# Patient Record
Sex: Female | Born: 1985 | Race: Black or African American | Hispanic: No | Marital: Single | Smoking: Current every day smoker
Health system: Southern US, Community
[De-identification: ages and names within clinical notes are randomized; demographics above are authoritative.]

## PROBLEM LIST (undated history)

## (undated) ENCOUNTER — Inpatient Hospital Stay (HOSPITAL_COMMUNITY): Payer: Self-pay

## (undated) DIAGNOSIS — Z8619 Personal history of other infectious and parasitic diseases: Secondary | ICD-10-CM

## (undated) DIAGNOSIS — F419 Anxiety disorder, unspecified: Secondary | ICD-10-CM

## (undated) DIAGNOSIS — F53 Postpartum depression: Secondary | ICD-10-CM

## (undated) DIAGNOSIS — N898 Other specified noninflammatory disorders of vagina: Secondary | ICD-10-CM

## (undated) DIAGNOSIS — R87619 Unspecified abnormal cytological findings in specimens from cervix uteri: Secondary | ICD-10-CM

## (undated) DIAGNOSIS — Z309 Encounter for contraceptive management, unspecified: Secondary | ICD-10-CM

## (undated) DIAGNOSIS — F329 Major depressive disorder, single episode, unspecified: Secondary | ICD-10-CM

## (undated) DIAGNOSIS — R51 Headache: Secondary | ICD-10-CM

## (undated) DIAGNOSIS — N760 Acute vaginitis: Principal | ICD-10-CM

## (undated) DIAGNOSIS — F32A Depression, unspecified: Secondary | ICD-10-CM

## (undated) DIAGNOSIS — O99345 Other mental disorders complicating the puerperium: Secondary | ICD-10-CM

## (undated) DIAGNOSIS — IMO0002 Reserved for concepts with insufficient information to code with codable children: Secondary | ICD-10-CM

## (undated) DIAGNOSIS — F17200 Nicotine dependence, unspecified, uncomplicated: Secondary | ICD-10-CM

## (undated) DIAGNOSIS — B9689 Other specified bacterial agents as the cause of diseases classified elsewhere: Secondary | ICD-10-CM

## (undated) DIAGNOSIS — A599 Trichomoniasis, unspecified: Secondary | ICD-10-CM

## (undated) HISTORY — DX: Personal history of other infectious and parasitic diseases: Z86.19

## (undated) HISTORY — DX: Acute vaginitis: N76.0

## (undated) HISTORY — DX: Other mental disorders complicating the puerperium: O99.345

## (undated) HISTORY — DX: Trichomoniasis, unspecified: A59.9

## (undated) HISTORY — DX: Unspecified abnormal cytological findings in specimens from cervix uteri: R87.619

## (undated) HISTORY — DX: Encounter for contraceptive management, unspecified: Z30.9

## (undated) HISTORY — DX: Headache: R51

## (undated) HISTORY — DX: Other specified noninflammatory disorders of vagina: N89.8

## (undated) HISTORY — DX: Other specified bacterial agents as the cause of diseases classified elsewhere: B96.89

## (undated) HISTORY — DX: Reserved for concepts with insufficient information to code with codable children: IMO0002

## (undated) HISTORY — PX: NO PAST SURGERIES: SHX2092

## (undated) HISTORY — DX: Postpartum depression: F53.0

---
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
## 2006-03-01 ENCOUNTER — Emergency Department (HOSPITAL_COMMUNITY): Admission: EM | Admit: 2006-03-01 | Discharge: 2006-03-01 | Payer: Self-pay | Admitting: Emergency Medicine

## 2006-03-20 ENCOUNTER — Emergency Department (HOSPITAL_COMMUNITY): Admission: EM | Admit: 2006-03-20 | Discharge: 2006-03-20 | Payer: Self-pay | Admitting: Emergency Medicine

## 2006-09-11 ENCOUNTER — Emergency Department (HOSPITAL_COMMUNITY): Admission: EM | Admit: 2006-09-11 | Discharge: 2006-09-11 | Payer: Self-pay | Admitting: Emergency Medicine

## 2006-10-04 ENCOUNTER — Emergency Department (HOSPITAL_COMMUNITY): Admission: EM | Admit: 2006-10-04 | Discharge: 2006-10-04 | Payer: Self-pay | Admitting: Emergency Medicine

## 2006-12-29 ENCOUNTER — Emergency Department (HOSPITAL_COMMUNITY): Admission: EM | Admit: 2006-12-29 | Discharge: 2006-12-29 | Payer: Self-pay | Admitting: Emergency Medicine

## 2007-03-30 ENCOUNTER — Emergency Department (HOSPITAL_COMMUNITY): Admission: EM | Admit: 2007-03-30 | Discharge: 2007-03-30 | Payer: Self-pay | Admitting: Emergency Medicine

## 2007-05-14 ENCOUNTER — Inpatient Hospital Stay (HOSPITAL_COMMUNITY): Admission: AD | Admit: 2007-05-14 | Discharge: 2007-05-16 | Payer: Self-pay | Admitting: Obstetrics & Gynecology

## 2007-05-14 ENCOUNTER — Ambulatory Visit: Payer: Self-pay | Admitting: Obstetrics & Gynecology

## 2007-11-14 ENCOUNTER — Emergency Department (HOSPITAL_COMMUNITY): Admission: EM | Admit: 2007-11-14 | Discharge: 2007-11-14 | Payer: Self-pay | Admitting: Emergency Medicine

## 2007-12-20 ENCOUNTER — Other Ambulatory Visit: Admission: RE | Admit: 2007-12-20 | Discharge: 2007-12-20 | Payer: Self-pay | Admitting: Obstetrics and Gynecology

## 2008-03-22 ENCOUNTER — Ambulatory Visit: Payer: Self-pay | Admitting: Family

## 2008-03-22 ENCOUNTER — Inpatient Hospital Stay (HOSPITAL_COMMUNITY): Admission: AD | Admit: 2008-03-22 | Discharge: 2008-03-25 | Payer: Self-pay | Admitting: Obstetrics and Gynecology

## 2008-08-13 ENCOUNTER — Emergency Department (HOSPITAL_COMMUNITY): Admission: EM | Admit: 2008-08-13 | Discharge: 2008-08-13 | Payer: Self-pay | Admitting: Emergency Medicine

## 2009-01-10 ENCOUNTER — Other Ambulatory Visit: Admission: RE | Admit: 2009-01-10 | Discharge: 2009-01-10 | Payer: Self-pay | Admitting: Obstetrics and Gynecology

## 2009-02-18 ENCOUNTER — Emergency Department (HOSPITAL_COMMUNITY): Admission: EM | Admit: 2009-02-18 | Discharge: 2009-02-18 | Payer: Self-pay | Admitting: Emergency Medicine

## 2009-11-30 IMAGING — CR DG RIBS BILAT 3V
5 series · 5 of 5 positions shown · non-contrast
Comparison: 03/20/2006

CLINICAL DATA: Chest pain.

BILATERAL RIBS - 3+ VIEW

[view not recorded (1 of 5)]
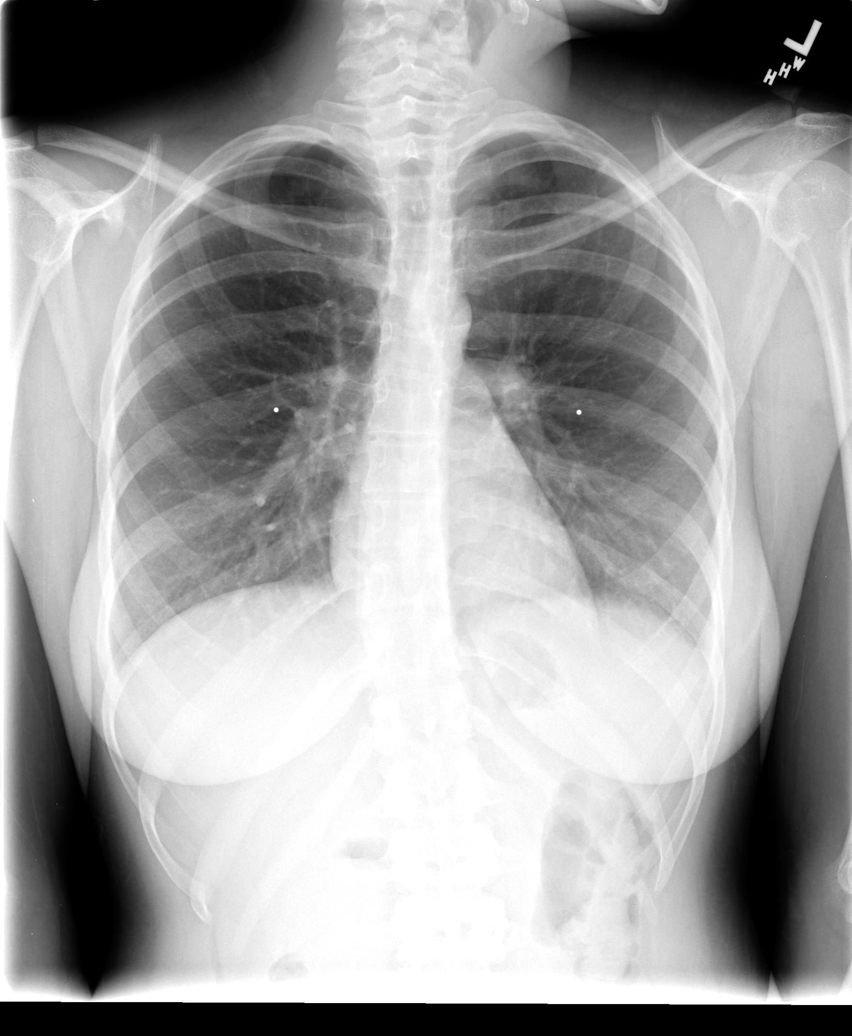

[view not recorded (2 of 5)]
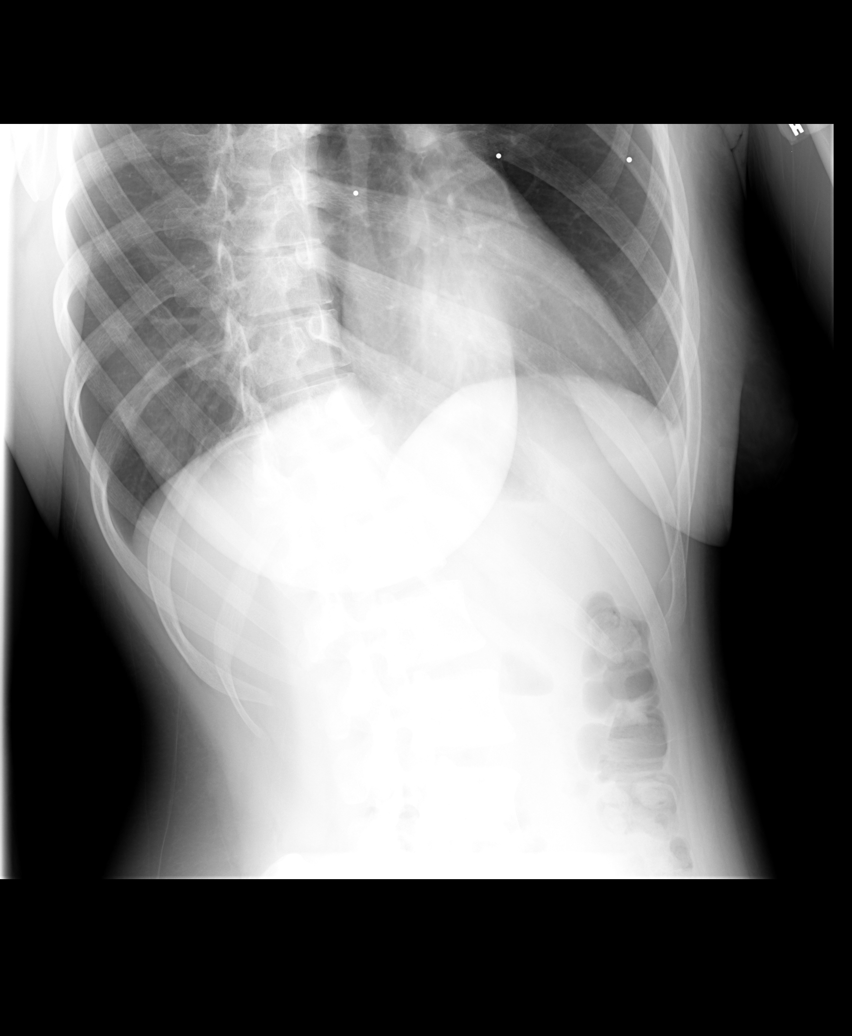

[view not recorded (3 of 5)]
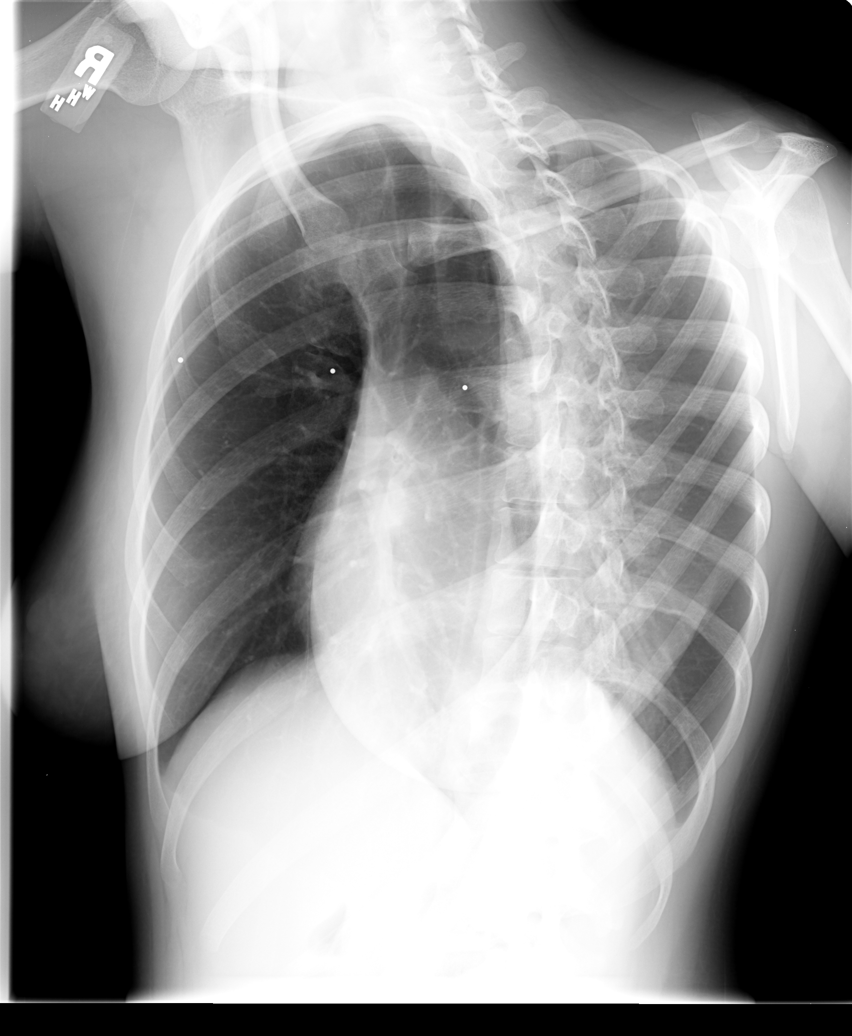

[view not recorded (4 of 5)]
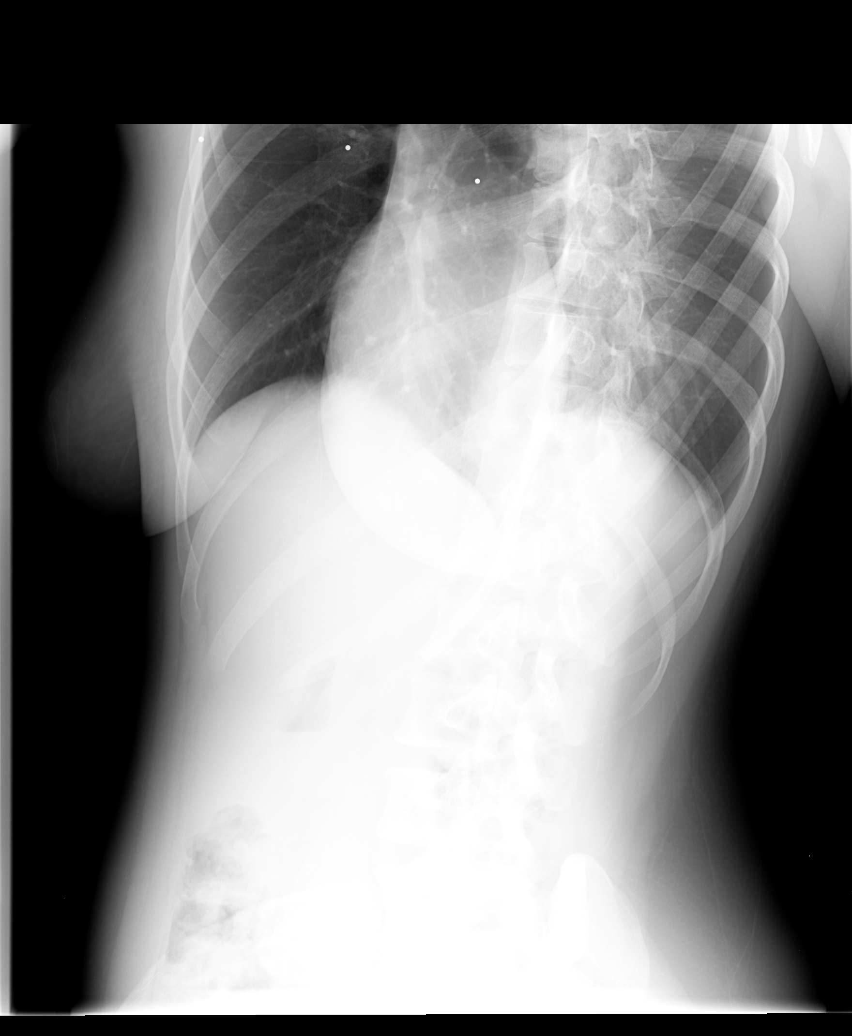

[view not recorded (5 of 5)]
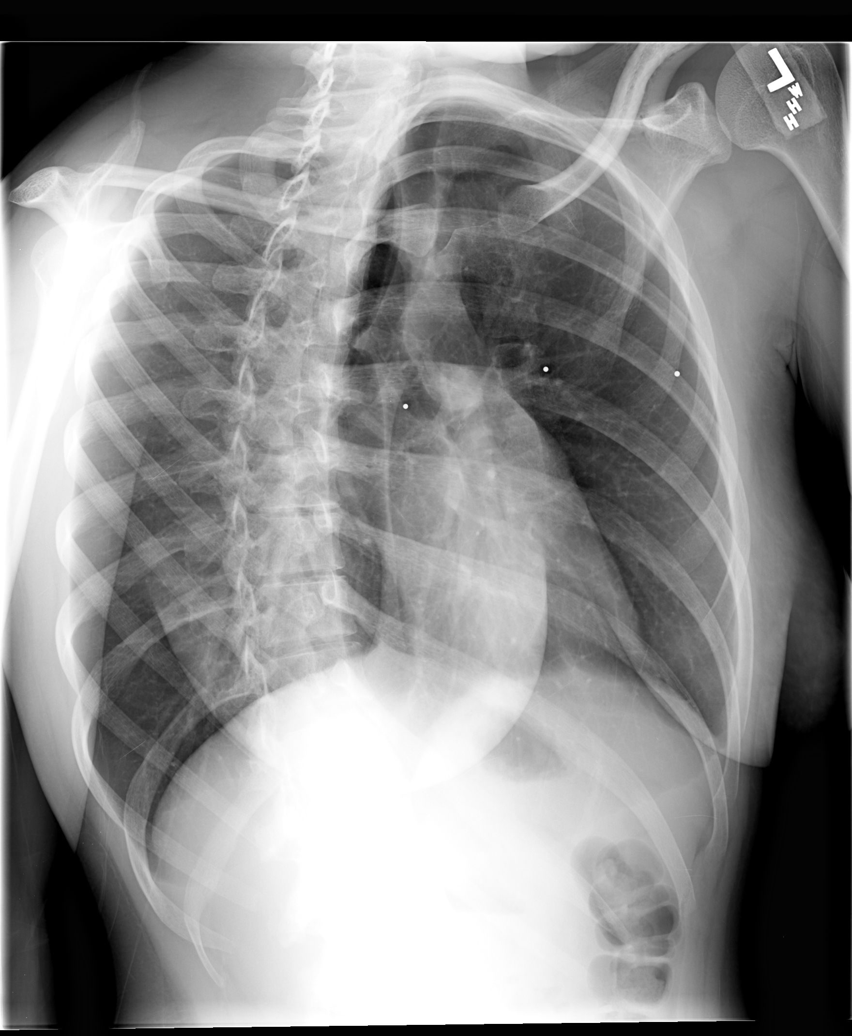

[5 of 5 positions shown; findings below may reference images not displayed]

FINDINGS: Heart and mediastinal contours are within normal limits.
No focal opacities or effusions.  No acute bony abnormality. No
evidence of rib fracture.  No pneumothorax.
IMPRESSION: No acute findings.

## 2010-01-07 ENCOUNTER — Emergency Department (HOSPITAL_COMMUNITY): Admission: EM | Admit: 2010-01-07 | Discharge: 2010-01-07 | Payer: Self-pay | Admitting: Emergency Medicine

## 2010-08-01 ENCOUNTER — Other Ambulatory Visit: Payer: Self-pay | Admitting: Adult Health

## 2010-08-01 ENCOUNTER — Other Ambulatory Visit (HOSPITAL_COMMUNITY)
Admission: RE | Admit: 2010-08-01 | Discharge: 2010-08-01 | Disposition: A | Discharge status: Home / Self Care | Payer: Medicaid Other | Source: Ambulatory Visit | Attending: Obstetrics and Gynecology | Admitting: Obstetrics and Gynecology

## 2010-08-01 DIAGNOSIS — Z01419 Encounter for gynecological examination (general) (routine) without abnormal findings: Secondary | ICD-10-CM | POA: Insufficient documentation

## 2010-08-01 DIAGNOSIS — Z113 Encounter for screening for infections with a predominantly sexual mode of transmission: Secondary | ICD-10-CM | POA: Insufficient documentation

## 2010-08-01 DIAGNOSIS — Z1159 Encounter for screening for other viral diseases: Secondary | ICD-10-CM | POA: Insufficient documentation

## 2010-08-04 ENCOUNTER — Emergency Department (HOSPITAL_COMMUNITY)
Admission: EM | Admit: 2010-08-04 | Discharge: 2010-08-05 | Disposition: A | Discharge status: Home / Self Care | Payer: Medicaid Other | Attending: Emergency Medicine | Admitting: Emergency Medicine

## 2010-08-04 DIAGNOSIS — R4589 Other symptoms and signs involving emotional state: Secondary | ICD-10-CM | POA: Insufficient documentation

## 2011-01-09 LAB — URINALYSIS, ROUTINE W REFLEX MICROSCOPIC
Ketones, ur: NEGATIVE
Nitrite: NEGATIVE
Protein, ur: NEGATIVE
Specific Gravity, Urine: 1.015

## 2011-01-09 LAB — URINE MICROSCOPIC-ADD ON: RBC / HPF: NONE SEEN

## 2011-01-09 LAB — CBC
MCHC: 34
RBC: 3.82 — ABNORMAL LOW
RDW: 13.4

## 2011-01-17 LAB — BASIC METABOLIC PANEL
BUN: 8
GFR calc Af Amer: >60
GFR calc non Af Amer: >60
Potassium: 4

## 2011-01-17 LAB — URINALYSIS, ROUTINE W REFLEX MICROSCOPIC
Hgb urine dipstick: NEGATIVE
Protein, ur: NEGATIVE
Urobilinogen, UA: 1
pH: 6

## 2011-01-17 LAB — CBC
HCT: 35.7 — ABNORMAL LOW
MCHC: 33.4
MCV: 93.8
Platelets: 144 — ABNORMAL LOW
RBC: 3.8 — ABNORMAL LOW
RDW: 15
WBC: 9

## 2011-01-17 LAB — PREGNANCY, URINE: Preg Test, Ur: POSITIVE

## 2011-01-24 LAB — CBC
HCT: 35.7 % — ABNORMAL LOW (ref 36.0–46.0)
Hemoglobin: 11.9 g/dL — ABNORMAL LOW (ref 12.0–15.0)
MCHC: 33.4 g/dL (ref 30.0–36.0)
MCV: 91.6 fL (ref 78.0–100.0)
Platelets: 141 10*3/uL — ABNORMAL LOW (ref 150–400)
RBC: 3.9 MIL/uL (ref 3.87–5.11)

## 2011-01-24 LAB — RPR: RPR Ser Ql: NONREACTIVE

## 2011-01-27 LAB — GC/CHLAMYDIA PROBE AMP, GENITAL
Chlamydia, DNA Probe: NEGATIVE
GC Probe Amp, Genital: NEGATIVE

## 2011-01-27 LAB — URINALYSIS, ROUTINE W REFLEX MICROSCOPIC
Bilirubin Urine: NEGATIVE
Glucose, UA: NEGATIVE
Ketones, ur: NEGATIVE
Nitrite: NEGATIVE
Protein, ur: NEGATIVE
Specific Gravity, Urine: 1.02
Urobilinogen, UA: 0.2
pH: 8

## 2011-01-27 LAB — URINE CULTURE
Colony Count: NO GROWTH
Culture: NO GROWTH

## 2011-01-27 LAB — WET PREP, GENITAL: Trich, Wet Prep: NONE SEEN

## 2011-01-27 LAB — URINE MICROSCOPIC-ADD ON

## 2011-03-21 ENCOUNTER — Emergency Department (HOSPITAL_COMMUNITY)
Admission: EM | Admit: 2011-03-21 | Discharge: 2011-03-21 | Disposition: A | Discharge status: Home / Self Care | Payer: Medicaid Other | Attending: Emergency Medicine | Admitting: Emergency Medicine

## 2011-03-21 ENCOUNTER — Encounter: Payer: Self-pay | Admitting: *Deleted

## 2011-03-21 DIAGNOSIS — F410 Panic disorder [episodic paroxysmal anxiety] without agoraphobia: Secondary | ICD-10-CM | POA: Insufficient documentation

## 2011-03-21 DIAGNOSIS — S0990XA Unspecified injury of head, initial encounter: Secondary | ICD-10-CM

## 2011-03-21 DIAGNOSIS — F3289 Other specified depressive episodes: Secondary | ICD-10-CM | POA: Insufficient documentation

## 2011-03-21 DIAGNOSIS — F17200 Nicotine dependence, unspecified, uncomplicated: Secondary | ICD-10-CM | POA: Insufficient documentation

## 2011-03-21 DIAGNOSIS — J45909 Unspecified asthma, uncomplicated: Secondary | ICD-10-CM | POA: Insufficient documentation

## 2011-03-21 DIAGNOSIS — F329 Major depressive disorder, single episode, unspecified: Secondary | ICD-10-CM | POA: Insufficient documentation

## 2011-03-21 HISTORY — DX: Major depressive disorder, single episode, unspecified: F32.9

## 2011-03-21 HISTORY — DX: Depression, unspecified: F32.A

## 2011-03-21 HISTORY — DX: Anxiety disorder, unspecified: F41.9

## 2011-03-21 NOTE — ED Notes (Signed)
Talking on phone to get a ride.
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
## 2011-09-09 ENCOUNTER — Other Ambulatory Visit (HOSPITAL_COMMUNITY)
Admission: RE | Admit: 2011-09-09 | Discharge: 2011-09-09 | Disposition: A | Discharge status: Home / Self Care | Payer: Medicaid Other | Source: Ambulatory Visit | Attending: Obstetrics and Gynecology | Admitting: Obstetrics and Gynecology

## 2011-09-09 ENCOUNTER — Other Ambulatory Visit: Payer: Self-pay | Admitting: Family Medicine

## 2011-09-09 DIAGNOSIS — Z01419 Encounter for gynecological examination (general) (routine) without abnormal findings: Secondary | ICD-10-CM | POA: Insufficient documentation

## 2011-09-09 DIAGNOSIS — N760 Acute vaginitis: Secondary | ICD-10-CM | POA: Insufficient documentation

## 2011-09-09 DIAGNOSIS — Z113 Encounter for screening for infections with a predominantly sexual mode of transmission: Secondary | ICD-10-CM | POA: Insufficient documentation

## 2011-09-30 LAB — OB RESULTS CONSOLE HIV ANTIBODY (ROUTINE TESTING): HIV: NONREACTIVE

## 2011-09-30 LAB — OB RESULTS CONSOLE HEPATITIS B SURFACE ANTIGEN: Hepatitis B Surface Ag: NEGATIVE

## 2011-09-30 LAB — OB RESULTS CONSOLE ANTIBODY SCREEN: Antibody Screen: NEGATIVE

## 2011-10-21 ENCOUNTER — Emergency Department (HOSPITAL_COMMUNITY)
Admission: EM | Admit: 2011-10-21 | Discharge: 2011-10-21 | Disposition: A | Discharge status: Home / Self Care | Payer: Medicaid Other | Attending: Emergency Medicine | Admitting: Emergency Medicine

## 2011-10-21 ENCOUNTER — Encounter (HOSPITAL_COMMUNITY): Payer: Self-pay | Admitting: *Deleted

## 2011-10-21 DIAGNOSIS — M79609 Pain in unspecified limb: Secondary | ICD-10-CM | POA: Insufficient documentation

## 2011-10-21 DIAGNOSIS — O210 Mild hyperemesis gravidarum: Secondary | ICD-10-CM | POA: Insufficient documentation

## 2011-10-21 DIAGNOSIS — F3289 Other specified depressive episodes: Secondary | ICD-10-CM | POA: Insufficient documentation

## 2011-10-21 DIAGNOSIS — J45909 Unspecified asthma, uncomplicated: Secondary | ICD-10-CM | POA: Insufficient documentation

## 2011-10-21 DIAGNOSIS — F329 Major depressive disorder, single episode, unspecified: Secondary | ICD-10-CM | POA: Insufficient documentation

## 2011-10-21 DIAGNOSIS — Z79899 Other long term (current) drug therapy: Secondary | ICD-10-CM | POA: Insufficient documentation

## 2011-10-21 LAB — CBC WITH DIFFERENTIAL/PLATELET
Basophils Relative: 0 % (ref 0–1)
Eosinophils Absolute: 0.1 10*3/uL (ref 0.0–0.7)
Eosinophils Relative: 2 % (ref 0–5)
MCH: 32.3 pg (ref 26.0–34.0)
MCHC: 33.8 g/dL (ref 30.0–36.0)
Neutrophils Relative %: 61 % (ref 43–77)
Platelets: 188 10*3/uL (ref 150–400)
RDW: 13.2 % (ref 11.5–15.5)

## 2011-10-21 LAB — URINALYSIS, ROUTINE W REFLEX MICROSCOPIC
Nitrite: NEGATIVE
Specific Gravity, Urine: 1.025 (ref 1.005–1.030)
Urobilinogen, UA: 4 mg/dL — ABNORMAL HIGH (ref 0.0–1.0)

## 2011-10-21 LAB — BASIC METABOLIC PANEL
Calcium: 9.6 mg/dL (ref 8.4–10.5)
GFR calc Af Amer: >90 mL/min (ref >90)
GFR calc non Af Amer: >90 mL/min (ref >90)
Potassium: 3.3 mEq/L — ABNORMAL LOW (ref 3.5–5.1)
Sodium: 133 mEq/L — ABNORMAL LOW (ref 135–145)

## 2011-10-21 LAB — URINE MICROSCOPIC-ADD ON

## 2011-10-21 MED ORDER — ACETAMINOPHEN 325 MG PO TABS
650.0000 mg | ORAL_TABLET | Freq: Once | ORAL | Status: AC
  Administered 2011-10-21: 650 mg via ORAL
  Filled 2011-10-21: qty 2

## 2011-10-21 MED ORDER — PROMETHAZINE HCL 25 MG PO TABS: ORAL_TABLET | ORAL | Status: DC

## 2011-10-21 MED ORDER — PROMETHAZINE HCL 12.5 MG PO TABS
ORAL_TABLET | ORAL | Status: AC
  Filled 2011-10-21: qty 2

## 2011-10-21 MED ORDER — SODIUM CHLORIDE 0.9 % IV SOLN: INTRAVENOUS | Status: DC

## 2011-10-21 MED ORDER — PROMETHAZINE HCL 25 MG PO TABS: 25.0000 mg | ORAL_TABLET | Freq: Four times a day (QID) | ORAL | Status: DC | PRN

## 2011-10-21 MED ORDER — SODIUM CHLORIDE 0.9 % IV BOLUS (SEPSIS)
2000.0000 mL | Freq: Once | INTRAVENOUS | Status: AC
  Administered 2011-10-21: 2000 mL via INTRAVENOUS

## 2011-10-21 MED ORDER — ONDANSETRON HCL 4 MG/2ML IJ SOLN
4.0000 mg | Freq: Once | INTRAMUSCULAR | Status: AC
  Administered 2011-10-21: 4 mg via INTRAVENOUS
  Filled 2011-10-21: qty 2

## 2011-10-21 MED ORDER — PROMETHAZINE HCL 12.5 MG PO TABS
ORAL_TABLET | ORAL | Status: AC
  Filled 2011-10-21: qty 3

## 2011-10-21 MED ORDER — PROMETHAZINE HCL 12.5 MG PO TABS
25.0000 mg | ORAL_TABLET | Freq: Once | ORAL | Status: AC
  Administered 2011-10-21: 25 mg via ORAL
  Filled 2011-10-21: qty 2

## 2011-10-21 NOTE — ED Notes (Signed)
Pt. States she had experienced severe nausea and vomiting for entirety of pregnancy.  Prior to finding out she was pregnant, she was taking Mylanta, but stopped after learning of pregnancy.  Her MD, Dr. Emelda Fear gave her an antiemetic  Rx, but she couldn't afford it b/c she needed to get her Medicaid renewed.  She has not been able to keep any food or liquids down.  She does not know what she weighs, but states her clothing has gotten loose.  The last time she vomited today, she states it was grayish-black.

## 2011-10-21 NOTE — ED Provider Notes (Signed)
History     CSN: [id number]  Arrival date & time 10/21/11  2026   First MD Initiated Contact with Patient 10/21/11 2034      Chief Complaint  Patient presents with  . Nausea  . Emesis    (Consider location/radiation/quality/duration/timing/severity/associated sxs/prior treatment) HPI Comments: Ashley Simon is a 26 y.o. Female who complains of nausea for 8 weeks. This is coincident with the duration of her current pregnancy. She is seeing a obstetrician regularly. She cannot afford the antibiotics that she was prescribed. She last saw her obstetrician 2 weeks ago. This is her third pregnancy, the first 2 did not have any prolonged vomiting during the early pregnancy. She denies fever, chills, cough, shortness of breath, back, pain, weakness, or dizziness.  The history is provided by the patient.    Past Medical History  Diagnosis Date  . Asthma   . Depression   . Anxiety   . Migraine     History reviewed. No pertinent past surgical history.  History reviewed. No pertinent family history.  History  Substance Use Topics  . Smoking status: Former Smoker    Types: Cigarettes  . Smokeless tobacco: Not on file  . Alcohol Use: No    OB History    Grav Para Term Preterm Abortions TAB SAB Ect Mult Living   1               Review of Systems  All other systems reviewed and are negative.    Allergies  Calcium-containing compounds  Home Medications   Current Outpatient Rx  Name Route Sig Dispense Refill  . ALBUTEROL SULFATE HFA 108 (90 BASE) MCG/ACT IN AERS Inhalation Inhale 2 puffs into the lungs every 6 (six) hours as needed.    Marland Kitchen PROMETHAZINE HCL 25 MG PO TABS Oral Take 1 tablet (25 mg total) by mouth every 6 (six) hours as needed for nausea. 30 tablet 0  . PROMETHAZINE HCL 25 MG PO TABS  Dispense to Effie Shy, MD to give to pt 2 tablet 0    BP 107/62  Pulse 69  Temp 98.3 F (36.8 C)  Resp 16  Ht 5\' 2"  (1.575 m)  Wt 115 lb (52.164 kg)  BMI 21.03 kg/m2  SpO2  99%  LMP 08/18/2011  Physical Exam  Nursing note and vitals reviewed. Constitutional: She is oriented to person, place, and time. She appears well-developed and well-nourished.  HENT:  Head: Normocephalic and atraumatic.  Eyes: Conjunctivae and EOM are normal. Pupils are equal, round, and reactive to light.  Neck: Normal range of motion and phonation normal. Neck supple.  Cardiovascular: Normal rate, regular rhythm and intact distal pulses.   Pulmonary/Chest: Effort normal and breath sounds normal. She exhibits no tenderness.  Abdominal: Soft. She exhibits no distension. There is tenderness (mild diffuse). There is no guarding.  Musculoskeletal: Normal range of motion. She exhibits tenderness (tender left calf, without swelling, or deformity.).  Neurological: She is alert and oriented to person, place, and time. She has normal strength. She exhibits normal muscle tone.  Skin: Skin is warm and dry.  Psychiatric: Her behavior is normal. Judgment and thought content normal.       She is anxious    ED Course  Procedures (including critical care time)  Emergency department treatment: IV fluids, IV, Zofran, oral Tylenol.  Orthostatic vital signs are normal     Labs Reviewed  CBC WITH DIFFERENTIAL - Abnormal; Notable for the following:    Monocytes Relative 14 (*)  All other components within normal limits  BASIC METABOLIC PANEL - Abnormal; Notable for the following:    Sodium 133 (*)     Potassium 3.3 (*)     Glucose, Bld 109 (*)     All other components within normal limits  URINALYSIS, ROUTINE W REFLEX MICROSCOPIC - Abnormal; Notable for the following:    Color, Urine AMBER (*)  BIOCHEMICALS MAY BE AFFECTED BY COLOR   Bilirubin Urine SMALL (*)     Ketones, ur 15 (*)     Protein, ur TRACE (*)     Urobilinogen, UA 4.0 (*)     All other components within normal limits  URINE MICROSCOPIC-ADD ON - Abnormal; Notable for the following:    Squamous Epithelial / LPF FEW (*)      Bacteria, UA FEW (*)     All other components within normal limits  URINE CULTURE   No results found.   1. Hyperemesis gravidarum       MDM  Ongoing vomiting, in pregnancy, consistent with morning sickness. No metabolic instability. Doubt serious bacterial infection.   Plan: Home Medications- Phenergan; Home Treatments- gradual diet advance; Recommended follow up- OB asap        Flint Melter, MD 10/23/11 [phone]

## 2011-10-21 NOTE — ED Notes (Signed)
Pt reports being about [redacted] weeks pregnant, severe nausea and vomiting.  Denies any OTC or prescription treatments.

## 2011-10-21 NOTE — ED Notes (Addendum)
Patient with no complaints at this time. Respirations even and unlabored. Skin warm/dry. Discharge instructions reviewed with patient at this time. Patient given opportunity to voice concerns/ask questions. MD provided pt. W/4- 12.5 Phenergan tabs. Reviewed use w/patient.  IV removed per policy and band-aid applied to site. Patient discharged at this time and left Emergency Department with steady gait.

## 2011-10-23 LAB — URINE CULTURE

## 2012-03-11 ENCOUNTER — Encounter (HOSPITAL_COMMUNITY): Payer: Self-pay | Admitting: *Deleted

## 2012-03-11 ENCOUNTER — Inpatient Hospital Stay (HOSPITAL_COMMUNITY)
Admission: AD | Admit: 2012-03-11 | Discharge: 2012-03-11 | Disposition: A | Discharge status: Home / Self Care | Payer: Medicaid Other | Source: Ambulatory Visit | Attending: Obstetrics & Gynecology | Admitting: Obstetrics & Gynecology

## 2012-03-11 ENCOUNTER — Emergency Department (HOSPITAL_COMMUNITY)
Admission: EM | Admit: 2012-03-11 | Discharge: 2012-03-11 | Disposition: A | Discharge status: Home / Self Care | Payer: Medicaid Other | Attending: Emergency Medicine | Admitting: Emergency Medicine

## 2012-03-11 ENCOUNTER — Inpatient Hospital Stay (HOSPITAL_COMMUNITY): Payer: Medicaid Other

## 2012-03-11 DIAGNOSIS — J45909 Unspecified asthma, uncomplicated: Secondary | ICD-10-CM | POA: Insufficient documentation

## 2012-03-11 DIAGNOSIS — Z8659 Personal history of other mental and behavioral disorders: Secondary | ICD-10-CM | POA: Insufficient documentation

## 2012-03-11 DIAGNOSIS — Z3490 Encounter for supervision of normal pregnancy, unspecified, unspecified trimester: Secondary | ICD-10-CM

## 2012-03-11 DIAGNOSIS — Z79899 Other long term (current) drug therapy: Secondary | ICD-10-CM | POA: Insufficient documentation

## 2012-03-11 DIAGNOSIS — O4690 Antepartum hemorrhage, unspecified, unspecified trimester: Secondary | ICD-10-CM

## 2012-03-11 DIAGNOSIS — N939 Abnormal uterine and vaginal bleeding, unspecified: Secondary | ICD-10-CM

## 2012-03-11 DIAGNOSIS — Z8669 Personal history of other diseases of the nervous system and sense organs: Secondary | ICD-10-CM | POA: Insufficient documentation

## 2012-03-11 DIAGNOSIS — R109 Unspecified abdominal pain: Secondary | ICD-10-CM | POA: Insufficient documentation

## 2012-03-11 LAB — URINALYSIS, ROUTINE W REFLEX MICROSCOPIC
Bilirubin Urine: NEGATIVE
Glucose, UA: NEGATIVE mg/dL
Nitrite: NEGATIVE
Protein, ur: 30 mg/dL — AB
Specific Gravity, Urine: >1.03 — ABNORMAL HIGH (ref 1.005–1.030)
Specific Gravity, Urine: 1.01 (ref 1.005–1.030)
Urobilinogen, UA: 1 mg/dL (ref 0.0–1.0)
pH: 6 (ref 5.0–8.0)

## 2012-03-11 LAB — URINE MICROSCOPIC-ADD ON

## 2012-03-11 LAB — WET PREP, GENITAL

## 2012-03-11 MED ORDER — FLUCONAZOLE 150 MG PO TABS: 150.0000 mg | ORAL_TABLET | Freq: Once | ORAL | Status: DC

## 2012-03-11 MED ORDER — SODIUM CHLORIDE 0.9 % IV SOLN
1000.0000 mL | Freq: Once | INTRAVENOUS | Status: AC
  Administered 2012-03-11: 1000 mL via INTRAVENOUS

## 2012-03-11 MED ORDER — SODIUM CHLORIDE 0.9 % IV SOLN: 1000.0000 mL | INTRAVENOUS | Status: DC

## 2012-03-11 NOTE — ED Notes (Signed)
Women's notified of pt being placed on TOCO monitor,

## 2012-03-11 NOTE — ED Notes (Addendum)
Spoke with Martin Majestic RN at [facility] Updated on EFM readings  Still awaiting MD evaluation

## 2012-03-11 NOTE — ED Notes (Signed)
Spoke with Martin Majestic RN at [facility] Dr. Lynelle Doctor spoke with Dr. Penne Lash at [facility] and [facility] EFM WNL, irritibility with an occasional contraction noted External monitors taken off to be transferred via EMS

## 2012-03-11 NOTE — ED Notes (Signed)
Per Cala Bradford at [facility] pt is not having any contractions and the baby "looks good" uterine irritability noted

## 2012-03-11 NOTE — ED Provider Notes (Signed)
History  This chart was scribed for Ward Givens, MD by Manuela Schwartz, ED scribe. This patient was seen in room [facility] and the patient's care was started at 1640.  CSN: [id number]  Arrival date & time 03/11/12  1640   First MD Initiated Contact with Patient 03/11/12 1652     Chief Complaint  Patient presents with  . Vaginal Bleeding  . Abdominal Pain   The history is provided by the patient. No language interpreter was used.   Ashley Simon is a 26 y.o. female who presents to the Emergency Department 29 weeks + 4 days pregnant, [id number], [facility] 05/24/2012  complaining of noticing blood when she wiped after urinating this AM and every time she urinated today. She states she later noticed "a lot of blood" in the commode later this PM; she denies blood on her underwear. She also complains of intermittent lower abdominal cramping since last PM. She states her abdominal pain is worse since she arrived along with right groin pain radiating into her right leg since arrival to the ED. She states she feels baby moving around as usual.  She states no previous abortions and all previous pregnancies were normal; no previous miscarriages. She states in her current pregnancy there have been no complications or abnormalities. . She states nausea, pain in buttocks, feet, and right leg.  She does not work, does not smoke and lives at home.   OB Dr Emelda Fear  Past Medical History  Diagnosis Date  . Asthma   . Depression   . Anxiety   . Migraine     History reviewed. No pertinent past surgical history.  No family history on file.  History  Substance Use Topics  . Smoking status: Former Smoker    Types: Cigarettes  . Smokeless tobacco: Not on file  . Alcohol Use: No  unemployed  OB History    Grav Para Term Preterm Abortions TAB SAB Ect Mult Living   1               Review of Systems  Constitutional: Negative for fever and chills.  Respiratory: Negative for shortness of breath.     Cardiovascular: Negative for chest pain.  Gastrointestinal: Positive for abdominal pain (lower abdominal cramping). Negative for nausea and vomiting.  Genitourinary: Positive for vaginal discharge (white vaginal dishcarge).       Blood on tissue this AM, saw blood in toilet this PM. She states unsure if shes having hematuria or vaginal bleeding  Musculoskeletal:       She states pain in her feet, buttocks, and right leg.  Neurological: Negative for weakness.  All other systems reviewed and are negative.   Allergies  Calcium-containing compounds  Home Medications   Current Outpatient Rx  Name  Route  Sig  Dispense  Refill  . ALBUTEROL SULFATE HFA 108 (90 BASE) MCG/ACT IN AERS   Inhalation   Inhale 2 puffs into the lungs every 6 (six) hours as needed. For shortness of breath         . FLINTSTONES/EXTRA C PO CHEW   Oral   Chew 2 tablets by mouth every morning.         Marland Kitchen PROMETHAZINE HCL 25 MG PO TABS      25 mg every 6 (six) hours as needed. For nausea and/or vomiting           Triage vitals: BP 110/67  Pulse 88  Temp 98.9 F (37.2 C) (Oral)  Resp 18  SpO2 100%  LMP 08/18/2011  Vital signs normal    Physical Exam  Nursing note and vitals reviewed. Constitutional: She is oriented to person, place, and time. She appears well-developed and well-nourished.  Non-toxic appearance. She does not appear ill. No distress.  HENT:  Head: Normocephalic and atraumatic.  Right Ear: External ear normal.  Left Ear: External ear normal.  Nose: Nose normal. No mucosal edema or rhinorrhea.  Mouth/Throat: Oropharynx is clear and moist and mucous membranes are normal. No dental abscesses or uvula swelling.  Eyes: Conjunctivae normal and EOM are normal. Pupils are equal, round, and reactive to light.  Neck: Normal range of motion and full passive range of motion without pain. Neck supple.  Cardiovascular: Normal rate, regular rhythm and normal heart sounds.  Exam reveals no gallop  and no friction rub.   No murmur heard. Pulmonary/Chest: Effort normal and breath sounds normal. No respiratory distress. She has no wheezes. She has no rhonchi. She has no rales. She exhibits no tenderness and no crepitus.  Abdominal: Soft. Normal appearance and bowel sounds are normal. She exhibits no distension. There is no tenderness. There is no rebound and no guarding.       Her abdomen is gravid and c/w date of pregnancy, her abdomen is not hard.  Genitourinary:       Normal external genitalia, no blood in vault, she has thin white discharge.  Musculoskeletal: Normal range of motion. She exhibits no edema and no tenderness.       Moves all extremities well.   Neurological: She is alert and oriented to person, place, and time. She has normal strength. No cranial nerve deficit.  Skin: Skin is warm, dry and intact. No rash noted. No erythema. No pallor.  Psychiatric: She has a normal mood and affect. Her speech is normal and behavior is normal. Her mood appears not anxious.   ED Course  Procedures (including critical care time)  Medications  0.9 %  sodium chloride infusion (1000 mL Intravenous New Bag/Given 03/11/12 1806)    Followed by  0.9 %  sodium chloride infusion (not administered)   DIAGNOSTIC STUDIES: Oxygen Saturation is 100% on room air, normal by my interpretation.    COORDINATION OF CARE: At 1800 Discussed treatment plan with patient which includes IV fluids, UA. Patient agrees.   Monitor shows FHR 145-151  Women's reports no contractions on monitor with uterine irritability and normal baby HR  17:51 Dr Penne Lash accepts in transfer to [facility] MAU   Results for orders placed during the hospital encounter of 03/11/12  URINALYSIS, ROUTINE W REFLEX MICROSCOPIC      Component Value Range   Color, Urine YELLOW  YELLOW   APPearance HAZY (*) CLEAR   Specific Gravity, Urine >1.030 (*) 1.005 - 1.030   pH 6.0  5.0 - 8.0   Glucose, UA NEGATIVE  NEGATIVE mg/dL    Hgb urine dipstick LARGE (*) NEGATIVE   Bilirubin Urine NEGATIVE  NEGATIVE   Ketones, ur NEGATIVE  NEGATIVE mg/dL   Protein, ur 30 (*) NEGATIVE mg/dL   Urobilinogen, UA 1.0  0.0 - 1.0 mg/dL   Nitrite NEGATIVE  NEGATIVE   Leukocytes, UA TRACE (*) NEGATIVE  URINE MICROSCOPIC-ADD ON      Component Value Range   Squamous Epithelial / LPF RARE  RARE   WBC, UA 0-2  <3 WBC/hpf   RBC / HPF TOO NUMEROUS TO COUNT  <3 RBC/hpf   Bacteria, UA FEW (*) RARE   Laboratory interpretation  all normal except hematuria, concentrated     1. Abdominal pain   2. Pregnancy   3. Uterine bleeding    Plan transfer to [facility] MAU    MDM  I personally performed the services described in this documentation, which was scribed in my presence. The recorded information has been reviewed and considered.  Devoria Albe, MD, [facility]         Ward Givens, MD 03/11/12 [phone]

## 2012-03-11 NOTE — MAU Note (Signed)
C/o gush of blood today around 1630; c/o cramping in vagina since last night;

## 2012-03-11 NOTE — ED Notes (Signed)
Gwenette Greet RN charge nurse updated on patient status and transfer to MAU

## 2012-03-11 NOTE — ED Notes (Signed)
Pt presents to er with c/o abd cramping that started last night, bright red vaginal bleeding that pt first noticed when she wiped after she urinated today, this afternoon pt experienced a "gush" of bright red blood that she noticed in her commode. Pt also having lower abd pain, pt placed on TOCO monitor, Fetal heart tones 140's, women's notified

## 2012-03-11 NOTE — MAU Provider Note (Signed)
Chief Complaint:  Vaginal Bleeding and Back Pain   First Provider Initiated Contact with Patient 03/11/12 1938      HPI: Ashley Simon is a 26 y.o. G3P2002 at [redacted]w[redacted]d who presents to maternity admissions via EMS reporting first episode of blood on TP after wiping this morning, then at 1630 saw BRB from vagina in toilet after voiding. Had menstrual-like cramping last night. Denies contractions today or leakage of fluid. Good fetal movement. Denies vaginal itch or irritation but has increased discharge. Last intercourse July. Denies dysuria, hematuria, urgency, frequency.  Pregnancy Course: PNC at FT essentially uncompicated. Nonsmoker. Placenta posterior.   Past Medical History: Past Medical History  Diagnosis Date  . Asthma   . Depression   . Anxiety   . Migraine     Past obstetric history: OB History    Grav Para Term Preterm Abortions TAB SAB Ect Mult Living   3 2 2       2      # Outc Date GA Lbr Len/2nd Wgt Sex Del Anes PTL Lv   1 TRM            2 TRM            3 CUR               Past Surgical History: Past Surgical History  Procedure Date  . No past surgeries     Family History: Family History  Problem Relation Age of Onset  . Other Neg Hx     Social History: History  Substance Use Topics  . Smoking status: Former Smoker    Types: Cigarettes  . Smokeless tobacco: Not on file  . Alcohol Use: No    Allergies:  Allergies  Allergen Reactions  . Calcium-Containing Compounds Nausea And Vomiting    Meds:  Prescriptions prior to admission  Medication Sig Dispense Refill  . albuterol (PROVENTIL HFA;VENTOLIN HFA) 108 (90 BASE) MCG/ACT inhaler Inhale 2 puffs into the lungs every 6 (six) hours as needed. For shortness of breath      . multivitamin (VIT W/EXTRA C) CHEW Chew 2 tablets by mouth every morning.      . promethazine (PHENERGAN) 25 MG tablet 25 mg every 6 (six) hours as needed. For nausea and/or vomiting        ROS: Pertinent findings in history of  present illness.  Physical Exam  Blood pressure 81/36, pulse 68, temperature 97.6 F (36.4 C), temperature source Oral, resp. rate 16, height 5\' 3"  (1.6 m), weight 60.782 kg (134 lb), last menstrual period 08/18/2011. GENERAL: Well-developed, well-nourished female in no acute distress.  HEENT: normocephalic HEART: normal rate RESP: normal effort ABDOMEN: Soft, non-tender, gravid appropriate for gestational age EXTREMITIES: Nontender, no edema NEURO: alert and oriented SPECULUM EXAM: Reddenened vaginal mucosa, slightly friable, copious curdy discharge discharge, no blood, cervix clean Dilation: Closed Effacement (%): Thick Station:  (high) Exam by:: D Ashley Simon  FHT:  Baseline 135-140 , moderate variability, accelerations present, no decelerations Toco: UI   Labs: Results for orders placed during the hospital encounter of 03/11/12 (from the past 24 hour(s))  WET PREP, GENITAL     Status: Abnormal   Collection Time   03/11/12  7:40 PM      Component Value Range   Yeast Wet Prep HPF POC MANY (*) NONE SEEN   Trich, Wet Prep NONE SEEN  NONE SEEN   Clue Cells Wet Prep HPF POC NONE SEEN  NONE SEEN   WBC, Wet  Prep HPF POC TOO NUMEROUS TO COUNT (*) NONE SEEN    Imaging:  No results found.  MAU Course: GC/CT sent  Assessment: No diagnosis found.  Plan:   Ashley Simon, Simon 03/11/2012 7:41 PM  Care assumed by Ashley Simon, Simon at 2000.  [place]:  US Ob Limited  03/11/2012  *RADIOLOGY REPORT*  Clinical Data: Vaginal bleeding and pain.  ULTRAOUND OB LIMITED - NRPT [facility]  Technique: Limited third trimester OB ultrasound was performed.  Comparison:  None  Findings: Single living intrauterine fetus noted with cardiac activity 147 beats per minute and spontaneous movement.  Cephalic presentation noted.  Posterior placenta demonstrates no specific abnormality.  No abruption.  The amount may be amniotic fluid appears normal, with an AFI of 15.5 and a 5.4 cm vertical pocket identified.   Biparietal diameter is 7.3 cm compatible with 29 weeks 3 days gestation, congruent with gestational age by last menstrual period.  The cervix is closed and measures 3.4 cm in length.  Maternal ovaries unremarkable.  IMPRESSION:  1.  Single living anterior pregnancy measuring at 29 weeks 3 days, with normal amniotic fluid volume, no findings of abruption, and no acute abnormality observed.   Original Report Authenticated By: Ashley Simon, M.D.    No further bleeding seen while here. Pt reports she cleaned the whole house today without eating ("because eating makes me tired") and thinks she overdid it.   Discussed with Dr Penne Lash.  Will D/C home. Has appt on the 11th, but advised to call them in AM to let them know about bleeding and see if they want to see her sooner.

## 2012-03-11 NOTE — ED Notes (Signed)
EMS here to transport pt to women's, report given to Scientist, clinical (histocompatibility and immunogenetics) at [facility]

## 2012-03-11 NOTE — ED Notes (Addendum)
Reports she noticed blood when she wiped after urinating this morning; reports then noticed "a lot of blood" in the commode.  States she is unsure if she has blood in her urine or is having vaginal bleeding. Also c/o lower abd/suprapubic cramping, intermittent since last night.  G3, P2, A0.

## 2012-03-12 LAB — GC/CHLAMYDIA PROBE AMP, GENITAL: GC Probe Amp, Genital: NEGATIVE

## 2012-03-21 NOTE — MAU Provider Note (Signed)
No blood seen on exam.  [place] nml.

## 2012-04-21 NOTE — L&D Delivery Note (Signed)
Patient fully dilated and pushing. Vacuum applied at +2 station secondary to fetal heart rate tracing demonstrating deep variables to 90's with rebound tachycardia to 180's without return to baseline. Delivery of viable female infant with Apgars 8 and 8 and weight 6 lb11oz over intact perineum from left OP position. Nose and mouth suctioned with bulb suction, cord clamped and cut. Infant handed to awaiting neonatology team, cord gases were sent. Placenta delivered whole and intact with 3-vessel cord. No cervical, vaginal or periurethral laceration noted. Small first degree perineal laceration was repaired with 3-0 Vicryl with excellent hemostasis and restoration of anatomy. Fundus firm, vaginal vault empty.

## 2012-04-27 LAB — OB RESULTS CONSOLE GBS: GBS: NEGATIVE

## 2012-05-03 ENCOUNTER — Emergency Department (HOSPITAL_COMMUNITY)
Admission: EM | Admit: 2012-05-03 | Discharge: 2012-05-03 | Disposition: A | Discharge status: Home / Self Care | Payer: Medicaid Other | Attending: Emergency Medicine | Admitting: Emergency Medicine

## 2012-05-03 ENCOUNTER — Encounter (HOSPITAL_COMMUNITY): Payer: Self-pay | Admitting: *Deleted

## 2012-05-03 DIAGNOSIS — R109 Unspecified abdominal pain: Secondary | ICD-10-CM | POA: Insufficient documentation

## 2012-05-03 DIAGNOSIS — Z87891 Personal history of nicotine dependence: Secondary | ICD-10-CM | POA: Insufficient documentation

## 2012-05-03 DIAGNOSIS — J45909 Unspecified asthma, uncomplicated: Secondary | ICD-10-CM | POA: Insufficient documentation

## 2012-05-03 DIAGNOSIS — O2390 Unspecified genitourinary tract infection in pregnancy, unspecified trimester: Secondary | ICD-10-CM | POA: Insufficient documentation

## 2012-05-03 DIAGNOSIS — Z8679 Personal history of other diseases of the circulatory system: Secondary | ICD-10-CM | POA: Insufficient documentation

## 2012-05-03 DIAGNOSIS — N390 Urinary tract infection, site not specified: Secondary | ICD-10-CM | POA: Insufficient documentation

## 2012-05-03 DIAGNOSIS — Z3490 Encounter for supervision of normal pregnancy, unspecified, unspecified trimester: Secondary | ICD-10-CM

## 2012-05-03 LAB — URINE MICROSCOPIC-ADD ON

## 2012-05-03 LAB — URINALYSIS, ROUTINE W REFLEX MICROSCOPIC
Hgb urine dipstick: NEGATIVE
Protein, ur: NEGATIVE mg/dL
Urobilinogen, UA: 0.2 mg/dL (ref 0.0–1.0)

## 2012-05-03 MED ORDER — HYDROCODONE-ACETAMINOPHEN 5-325 MG PO TABS: 1.0000 | ORAL_TABLET | Freq: Three times a day (TID) | ORAL | Status: DC | PRN

## 2012-05-03 MED ORDER — CEPHALEXIN 500 MG PO CAPS: 500.0000 mg | ORAL_CAPSULE | Freq: Four times a day (QID) | ORAL | Status: DC

## 2012-05-03 MED ORDER — HYDROCODONE-ACETAMINOPHEN 5-325 MG PO TABS
1.0000 | ORAL_TABLET | Freq: Once | ORAL | Status: AC
  Administered 2012-05-03: 1 via ORAL
  Filled 2012-05-03: qty 1

## 2012-05-03 NOTE — Progress Notes (Signed)
Ashley Simon, ED RN notified that no contractions had been noted just Uterine irritability and FHR tracing was reactive category I tracing.

## 2012-05-03 NOTE — ED Provider Notes (Signed)
History     CSN: [id number]  Arrival date & time 05/03/12  1304   First MD Initiated Contact with Patient 05/03/12 1309      Chief Complaint  Patient presents with  . 37 weeks. abdominal cramping.      Patient is a 27 y.o. female presenting with abdominal pain. The history is provided by the patient.  Abdominal Pain The primary symptoms of the illness include abdominal pain. The primary symptoms of the illness do not include fever, vomiting, diarrhea, vaginal discharge or vaginal bleeding. The current episode started more than 2 days ago. The onset of the illness was gradual. The problem has been gradually worsening.  The patient states that she believes she is currently pregnant.  pt reports she is around [redacted] weeks pregnant She reports she has had abdominal pain for past week She reports it worsened last night.  No vag bleeding or discharge.  No fluid loss noted.  She reports good fetal movement.  No falls/trauma reported.  No cp.  No weakness.  She reports she has had issues with vaginal bleeding previously but none currently.  She reports this is her third pregnancy.  She reports she has had two vaginal births  Past Medical History  Diagnosis Date  . Asthma   . Depression   . Anxiety   . Migraine     Past Surgical History  Procedure Date  . No past surgeries     Family History  Problem Relation Age of Onset  . Other Neg Hx     History  Substance Use Topics  . Smoking status: Former Smoker    Types: Cigarettes  . Smokeless tobacco: Not on file  . Alcohol Use: No    OB History    Grav Para Term Preterm Abortions TAB SAB Ect Mult Living   3 2 2       2       Review of Systems  Constitutional: Negative for fever.  Cardiovascular: Negative for chest pain.  Gastrointestinal: Positive for abdominal pain. Negative for vomiting and diarrhea.  Genitourinary: Negative for vaginal bleeding and vaginal discharge.  Neurological: Negative for weakness.    Psychiatric/Behavioral: Negative for agitation.  All other systems reviewed and are negative.    Allergies  Calcium-containing compounds  Home Medications   Current Outpatient Rx  Name  Route  Sig  Dispense  Refill  . ALBUTEROL SULFATE HFA 108 (90 BASE) MCG/ACT IN AERS   Inhalation   Inhale 2 puffs into the lungs every 6 (six) hours as needed. For shortness of breath         . FLINTSTONES COMPLETE 60 MG PO CHEW   Oral   Chew 2 tablets by mouth daily.         Marland Kitchen FLUCONAZOLE 150 MG PO TABS   Oral   Take 1 tablet (150 mg total) by mouth once.   1 tablet   1     Generic     BP 109/62  Pulse 104  Temp 97.6 F (36.4 C) (Oral)  Resp 20  Ht 5\' 2"  (1.575 m)  Wt 148 lb (67.132 kg)  BMI 27.07 kg/m2  SpO2 99%  LMP 08/18/2011  Physical Exam CONSTITUTIONAL: Well developed/well nourished HEAD AND FACE: Normocephalic/atraumatic EYES: EOMI/PERRL ENMT: Mucous membranes moist NECK: supple no meningeal signs SPINE:entire spine nontender CV: S1/S2 noted, no murmurs/rubs/gallops noted LUNGS: Lungs are clear to auscultation bilaterally, no apparent distress ABDOMEN: soft, nontender, no rebound or guarding. gravid GU:no cva tenderness.  No vag bleeding.  Cervical os at 2.  No products of conception. Sterile gloves used.  Chaperone present NEURO: Pt is awake/alert, moves all extremitiesx4 EXTREMITIES: pulses normal, full ROM SKIN: warm, color normal PSYCH: no abnormalities of mood noted  ED Course  Procedures  1:18 PM Spoke to dr Emelda Fear about this patient He will see patient in the ED 1:32 PM Dr Emelda Fear to see patient 3:41 PM Pt seen by dr Emelda Fear He does not feel she is in active labor uti noted - he requests I start keflex.  He also requests I give her short course of vicodin.  Her pain has been present for over a week, I doubt acute abdominal process or appendicitis.  She has had monitoring in the ED and no signs of contractions of fetal distress.  We discussed  strict return precautions.  Pt agreeable   MDM  Nursing notes including past medical history and social history reviewed and considered in documentation Previous records reviewed and considered         Joya Gaskins, MD 05/03/12 [phone]

## 2012-05-03 NOTE — ED Notes (Addendum)
Pt states abdominal cramping since last week. PT states constant pain. "Pain to my coochie and my bone down there" per pt Pt states she called Dr. Rayna Sexton office PTA but stated they would not re open until 1330 today for lunch. NAD. Third child.

## 2012-05-03 NOTE — ED Notes (Signed)
Spoke with Joni Reining, RN rapid response at MAU - states pt is not having contractions according to the monitor.  States pt is having uterine irritability and baby looks good on the monitor. Dr. Emelda Fear at bedside and notified.  States we will monitor pt here for approx 30 minutes and pt can be d/c.

## 2012-05-03 NOTE — Progress Notes (Signed)
ED called regarding 37 week pt with c/o abd pain/contractions. Pt denies vaginal bleeding or leaking fluid. EFM surveillance begun

## 2012-05-03 NOTE — ED Notes (Signed)
  History     CSN: [id number]  Arrival date and time: 05/03/12 1304   None     Chief Complaint  Patient presents with  . 37 weeks. abdominal cramping.    HPI Called [facility] During lunch,due to cramping, and when unable to speak with provider, came by POV to ED. Family "didn't have gas money" so didn't go to [facility]. - srom , - Bleeding, + suprapubic discomfort  U/a pending.    Past Medical History  Diagnosis Date  . Asthma   . Depression   . Anxiety   . Migraine     Past Surgical History  Procedure Date  . No past surgeries     Family History  Problem Relation Age of Onset  . Other Neg Hx     History  Substance Use Topics  . Smoking status: Former Smoker    Types: Cigarettes  . Smokeless tobacco: Not on file  . Alcohol Use: No    Allergies:  Allergies  Allergen Reactions  . Calcium-Containing Compounds Nausea And Vomiting     (Not in a hospital admission)  ROS Physical Exam   Blood pressure 109/62, pulse 104, temperature 97.6 F (36.4 C), temperature source Oral, resp. rate 20, height 5\' 2"  (1.575 m), weight 67.132 kg (148 lb), last menstrual period 08/18/2011, SpO2 99.00%.  Physical Exam Alert oriented. Physical Examination: Mental status - alert, oriented to person, place, and time, normal mood, behavior, speech, dress, motor activity, and thought processes, feels she has no solutions to problems like transportation. Abdomen - soft, nontender, nondistended, no masses or organomegaly Gravid uterus c/w dates, no contractions on FM, FHR category I, Pelvic per dr Bebe Shaggy MAU Course  Procedures  MDM Eval, labs, monitor recheck x 1 unless u/a abnormal.  Assessment and Plan  Pelvic discomfort, physiologic for late pregnancy No evidence labor Fetal wellbeing confirmed Plan  Monitor x 30' , review u/a, ask pt tokeep appt in office, and consider social work consult , and consider maternity belt for discomfort.  Veretta Sabourin V 05/03/2012, 1:47 PM

## 2012-05-04 ENCOUNTER — Inpatient Hospital Stay (HOSPITAL_COMMUNITY)
Admission: AD | Admit: 2012-05-04 | Discharge: 2012-05-05 | Disposition: A | Discharge status: Home / Self Care | Payer: Medicaid Other | Source: Ambulatory Visit | Attending: Family Medicine | Admitting: Family Medicine

## 2012-05-04 DIAGNOSIS — R109 Unspecified abdominal pain: Secondary | ICD-10-CM | POA: Insufficient documentation

## 2012-05-04 DIAGNOSIS — O26859 Spotting complicating pregnancy, unspecified trimester: Secondary | ICD-10-CM | POA: Insufficient documentation

## 2012-05-04 DIAGNOSIS — N390 Urinary tract infection, site not specified: Secondary | ICD-10-CM

## 2012-05-04 LAB — URINE CULTURE
Colony Count: NO GROWTH
Culture: NO GROWTH

## 2012-05-05 ENCOUNTER — Encounter (HOSPITAL_COMMUNITY): Payer: Self-pay

## 2012-05-05 ENCOUNTER — Encounter: Payer: Self-pay | Admitting: *Deleted

## 2012-05-05 DIAGNOSIS — R109 Unspecified abdominal pain: Secondary | ICD-10-CM

## 2012-05-05 DIAGNOSIS — O26859 Spotting complicating pregnancy, unspecified trimester: Secondary | ICD-10-CM

## 2012-05-05 DIAGNOSIS — Z302 Encounter for sterilization: Secondary | ICD-10-CM | POA: Insufficient documentation

## 2012-05-05 LAB — WET PREP, GENITAL: Clue Cells Wet Prep HPF POC: NONE SEEN

## 2012-05-05 NOTE — MAU Note (Signed)
Pt states that vaginal bleeding and pain started around 2pm. Denies any leaking of fluid. States she is contracting every 15 minutes or more

## 2012-05-05 NOTE — MAU Provider Note (Signed)
Chief Complaint:  Abdominal Pain and Vaginal Bleeding    HPI: Ashley Simon is a 27 y.o. G3P2002 at [redacted]w[redacted]d who presents to maternity admissions reporting some vaginal spotting beginning around 2PM.  Pt was seen at [facility] earlier in the day today for similar complaints and was found to have a UTI and placed on Keflex.  Pt went home, took an ABx and noticed around 2 PM she started to pass "clot's" of blood and continued to have vaginal bleeding until she presented in MAU.  Pt denies having fever, chills, sweats, lightheaded, dizziness.  Irregular contractions, denies leakage of fluid, and good fetal movement.   Denies trauma, recent sexual activity, or back pain/edema in legs.   Pregnancy Course:   Past Medical History: Past Medical History  Diagnosis Date  . Asthma   . Depression   . Anxiety   . Migraine     Past obstetric history: OB History    Grav Para Term Preterm Abortions TAB SAB Ect Mult Living   3 2 2       2      # Outc Date GA Lbr Len/2nd Wgt Sex Del Anes PTL Lv   1 TRM      SVD EPI     2 TRM      SVD EPI     3 CUR               Past Surgical History: Past Surgical History  Procedure Date  . No past surgeries     Family History: Family History  Problem Relation Age of Onset  . Other Neg Hx     Social History: History  Substance Use Topics  . Smoking status: Former Smoker    Types: Cigarettes  . Smokeless tobacco: Not on file  . Alcohol Use: No    Allergies:  Allergies  Allergen Reactions  . Calcium-Containing Compounds Nausea And Vomiting    Meds:  Prescriptions prior to admission  Medication Sig Dispense Refill  . cephALEXin (KEFLEX) 500 MG capsule Take 1 capsule (500 mg total) by mouth 4 (four) times daily.  28 capsule  0  . flintstones complete (FLINTSTONES) 60 MG chewable tablet Chew 2 tablets by mouth daily.      Marland Kitchen HYDROcodone-acetaminophen (NORCO/VICODIN) 5-325 MG per tablet Take 1 tablet by mouth every 8 (eight) hours as needed for pain.  6  tablet  0  . albuterol (PROVENTIL HFA;VENTOLIN HFA) 108 (90 BASE) MCG/ACT inhaler Inhale 2 puffs into the lungs every 6 (six) hours as needed. For shortness of breath        ROS: Pertinent findings in history of present illness.  Physical Exam  Blood pressure 107/57, pulse 81, temperature 97.1 F (36.2 C), temperature source Oral, resp. rate 18, height 5\' 3"  (1.6 m), weight 65.681 kg (144 lb 12.8 oz), last menstrual period 08/18/2011, SpO2 100.00%. GENERAL: Well-developed, well-nourished female in no acute distress.  HEENT: normocephalic HEART: RRR RESP: CTA ABDOMEN: Soft, non-tender, gravid appropriate for gestational age EXTREMITIES: Nontender, no edema NEURO: alert and oriented SPECULUM EXAM: NEFG, physiologic discharge, no blood, cervix clean Dilation: 3 Effacement (%): 70 Station: -2 Exam by:: Dr. Paulina Fusi  FHT:  Baseline 150 , moderate variability, accelerations present, no decelerations Contractions: 5-10 minutes    Labs: Results for orders placed during the hospital encounter of 05/04/12 (from the past 24 hour(s))  WET PREP, GENITAL     Status: Abnormal   Collection Time   05/05/12 12:42 AM  Component Value Range   Yeast Wet Prep HPF POC FEW (*) NONE SEEN   Trich, Wet Prep NONE SEEN  NONE SEEN   Clue Cells Wet Prep HPF POC NONE SEEN  NONE SEEN   WBC, Wet Prep HPF POC MODERATE (*) NONE SEEN    Imaging:  No results found. MAU Course: Most likely related to UTI.  Will get fern test, GC/Chlaymidia, and Wet Prep.    Assessment: 1) Vaginal Spotting  Plan: Discharge home Labor precautions and fetal kick counts Most likely secondary to UTI.  Continue Keflex as Rx.  GC/Chlaymidia negative on 04/27/12, will send again.  GBS negative     Medication List     As of 05/05/2012  4:50 AM    TAKE these medications         albuterol 108 (90 BASE) MCG/ACT inhaler   Commonly known as: PROVENTIL HFA;VENTOLIN HFA   Inhale 2 puffs into the lungs every 6 (six) hours as needed.  For shortness of breath      cephALEXin 500 MG capsule   Commonly known as: KEFLEX   Take 1 capsule (500 mg total) by mouth 4 (four) times daily.      flintstones complete 60 MG chewable tablet   Chew 2 tablets by mouth daily.      HYDROcodone-acetaminophen 5-325 MG per tablet   Commonly known as: NORCO/VICODIN   Take 1 tablet by mouth every 8 (eight) hours as needed for pain.          Briscoe Deutscher, DO 05/05/2012 12:44 AM  Pt examined and found to be with small white vag d/c- no blood seen.  In speaking with pt, it seems that she was seeing blood only with wiping.  Had recent exam at [facility]  Rev'd with pt labor and bldg precautions and when she needs to come in.  Noticed that urine culture from 05/03/12 resulted negative and so instructed to stop taking Keflex.  Cam Hai CNM 05/05/12 0940am

## 2012-05-06 NOTE — MAU Provider Note (Signed)
Chart reviewed and agree with management and plan.  

## 2012-05-11 ENCOUNTER — Encounter (HOSPITAL_COMMUNITY): Payer: Self-pay | Admitting: *Deleted

## 2012-05-11 ENCOUNTER — Inpatient Hospital Stay (HOSPITAL_COMMUNITY)
Admission: AD | Admit: 2012-05-11 | Discharge: 2012-05-13 | DRG: 775 | Disposition: A | Discharge status: Home / Self Care | Payer: Medicaid Other | Source: Ambulatory Visit | Attending: Obstetrics and Gynecology | Admitting: Obstetrics and Gynecology

## 2012-05-11 ENCOUNTER — Encounter (HOSPITAL_COMMUNITY): Payer: Self-pay | Admitting: Anesthesiology

## 2012-05-11 ENCOUNTER — Inpatient Hospital Stay (HOSPITAL_COMMUNITY): Payer: Medicaid Other | Admitting: Anesthesiology

## 2012-05-11 DIAGNOSIS — IMO0001 Reserved for inherently not codable concepts without codable children: Secondary | ICD-10-CM

## 2012-05-11 LAB — CBC
Hemoglobin: 11.5 g/dL — ABNORMAL LOW (ref 12.0–15.0)
MCH: 29.4 pg (ref 26.0–34.0)
MCHC: 32.1 g/dL (ref 30.0–36.0)
RDW: 14.1 % (ref 11.5–15.5)

## 2012-05-11 MED ORDER — LACTATED RINGERS IV SOLN: 500.0000 mL | INTRAVENOUS | Status: DC | PRN

## 2012-05-11 MED ORDER — FLEET ENEMA 7-19 GM/118ML RE ENEM: 1.0000 | ENEMA | RECTAL | Status: DC | PRN

## 2012-05-11 MED ORDER — PHENYLEPHRINE 40 MCG/ML (10ML) SYRINGE FOR IV PUSH (FOR BLOOD PRESSURE SUPPORT): 80.0000 ug | PREFILLED_SYRINGE | INTRAVENOUS | Status: DC | PRN

## 2012-05-11 MED ORDER — EPHEDRINE 5 MG/ML INJ: 10.0000 mg | INTRAVENOUS | Status: DC | PRN

## 2012-05-11 MED ORDER — LACTATED RINGERS IV SOLN
500.0000 mL | Freq: Once | INTRAVENOUS | Status: DC
Start: 2012-05-11 — End: 2012-05-12

## 2012-05-11 MED ORDER — OXYTOCIN BOLUS FROM INFUSION
500.0000 mL | INTRAVENOUS | Status: DC
  Administered 2012-05-12: 500 mL via INTRAVENOUS

## 2012-05-11 MED ORDER — DIPHENHYDRAMINE HCL 50 MG/ML IJ SOLN: 12.5000 mg | INTRAMUSCULAR | Status: DC | PRN

## 2012-05-11 MED ORDER — ONDANSETRON HCL 4 MG/2ML IJ SOLN: 4.0000 mg | Freq: Four times a day (QID) | INTRAMUSCULAR | Status: DC | PRN

## 2012-05-11 MED ORDER — NALBUPHINE SYRINGE 5 MG/0.5 ML: 5.0000 mg | INJECTION | INTRAMUSCULAR | Status: DC | PRN

## 2012-05-11 MED ORDER — CITRIC ACID-SODIUM CITRATE 334-500 MG/5ML PO SOLN: 30.0000 mL | ORAL | Status: DC | PRN

## 2012-05-11 MED ORDER — IBUPROFEN 600 MG PO TABS: 600.0000 mg | ORAL_TABLET | Freq: Four times a day (QID) | ORAL | Status: DC | PRN

## 2012-05-11 MED ORDER — OXYTOCIN 40 UNITS IN LACTATED RINGERS INFUSION - SIMPLE MED
62.5000 mL/h | INTRAVENOUS | Status: DC
  Filled 2012-05-11: qty 1000

## 2012-05-11 MED ORDER — HYDROXYZINE HCL 50 MG PO TABS: 50.0000 mg | ORAL_TABLET | Freq: Four times a day (QID) | ORAL | Status: DC | PRN

## 2012-05-11 MED ORDER — OXYCODONE-ACETAMINOPHEN 5-325 MG PO TABS: 1.0000 | ORAL_TABLET | ORAL | Status: DC | PRN

## 2012-05-11 MED ORDER — SODIUM BICARBONATE 8.4 % IV SOLN
INTRAVENOUS | Status: DC | PRN
  Administered 2012-05-11: 5 mL via EPIDURAL

## 2012-05-11 MED ORDER — LACTATED RINGERS IV SOLN
INTRAVENOUS | Status: DC
  Administered 2012-05-11 (×2): via INTRAVENOUS

## 2012-05-11 MED ORDER — FENTANYL 2.5 MCG/ML BUPIVACAINE 1/10 % EPIDURAL INFUSION (WH - ANES)
14.0000 mL/h | INTRAMUSCULAR | Status: DC
  Administered 2012-05-11: 14 mL/h via EPIDURAL
  Filled 2012-05-11 (×2): qty 125

## 2012-05-11 MED ORDER — EPHEDRINE 5 MG/ML INJ
10.0000 mg | INTRAVENOUS | Status: DC | PRN
  Filled 2012-05-11: qty 4

## 2012-05-11 MED ORDER — HYDROXYZINE HCL 50 MG/ML IM SOLN: 50.0000 mg | Freq: Four times a day (QID) | INTRAMUSCULAR | Status: DC | PRN

## 2012-05-11 MED ORDER — ACETAMINOPHEN 325 MG PO TABS: 650.0000 mg | ORAL_TABLET | ORAL | Status: DC | PRN

## 2012-05-11 MED ORDER — PHENYLEPHRINE 40 MCG/ML (10ML) SYRINGE FOR IV PUSH (FOR BLOOD PRESSURE SUPPORT)
80.0000 ug | PREFILLED_SYRINGE | INTRAVENOUS | Status: DC | PRN
  Filled 2012-05-11 (×2): qty 5

## 2012-05-11 MED ORDER — LIDOCAINE HCL (PF) 1 % IJ SOLN: 30.0000 mL | INTRAMUSCULAR | Status: DC | PRN

## 2012-05-11 NOTE — MAU Note (Signed)
Patient is in with c/o of intense vaginal pressure. She states that she can't tell if she is contracting. Denies lof or bleeding. Reports good fetal movement.

## 2012-05-11 NOTE — Anesthesia Preprocedure Evaluation (Signed)

## 2012-05-11 NOTE — MAU Provider Note (Signed)
Chief Complaint:  Labor Eval    HPI: Ashley Simon is a 27 y.o. G3P2002 at [redacted]w[redacted]d who presents to maternity admissions reporting increased abdominal pain and pressure today beginning in the AM.  Pt was seen at [facility] earlier in the day today for similar complaints and was thought to be having irregular contractions 4/70/-3.  Pt was sent home and continued to have some pain and pressure and was directed to come to MAU.  Pt denies having fever, chills, sweats, lightheaded, dizziness.   + for irregular contractions, denies leakage of fluid or vaginal bleeding, and good fetal movement.   Denies trauma, recent sexual activity, or back pain/edema in legs.   Pregnancy Course:   Past Medical History: Past Medical History  Diagnosis Date  . Asthma   . Depression   . Anxiety   . Migraine     Past obstetric history:     OB History    Grav Para Term Preterm Abortions TAB SAB Ect Mult Living   3 2 2       2      # Outc Date GA Lbr Len/2nd Wgt Sex Del Anes PTL Lv   1 TRM      SVD EPI     2 TRM      SVD EPI     3 CUR               Past Surgical History: Past Surgical History  Procedure Date  . No past surgeries     Family History: Family History  Problem Relation Age of Onset  . Other Neg Hx     Social History: History  Substance Use Topics  . Smoking status: Former Smoker    Types: Cigarettes  . Smokeless tobacco: Not on file  . Alcohol Use: No    Allergies:  Allergies  Allergen Reactions  . Calcium-Containing Compounds Nausea And Vomiting    Meds:  Prescriptions prior to admission  Medication Sig Dispense Refill  . flintstones complete (FLINTSTONES) 60 MG chewable tablet Chew 2 tablets by mouth daily.      Marland Kitchen albuterol (PROVENTIL HFA;VENTOLIN HFA) 108 (90 BASE) MCG/ACT inhaler Inhale 2 puffs into the lungs every 6 (six) hours as needed. For shortness of breath      . [DISCONTINUED] cephALEXin (KEFLEX) 500 MG capsule Take 1 capsule (500 mg total) by mouth 4 (four) times  daily.  28 capsule  0    ROS: Pertinent findings in history of present illness.  Physical Exam  Blood pressure 117/58, pulse 92, temperature 97.5 F (36.4 C), temperature source Oral, resp. rate 18, last menstrual period 08/18/2011. GENERAL: Well-developed, well-nourished female in no acute distress.  HEENT: normocephalic HEART: RRR RESP: CTA ABDOMEN: Soft, generalized nonspecific abdominal tenderness, no rebound or guarding, gravid appropriate for gestational age EXTREMITIES: Nontender, no edema NEURO: alert and oriented Dilation: 6 Effacement (%): 80 Cervical Position: Middle Station: -2 Presentation: Vertex Exam by:: Jonasia Coiner cnm  FHT:  Baseline 150 , moderate variability, accelerations present, no decelerations Contractions: 5-10 minutes    Labs: No results found for this or any previous visit (from the past 24 hour(s)).  Assessment: 1) Early Latent Labor  Plan: Discharge home Labor precautions and fetal kick counts Continue to follow with OB    Medication List     As of 05/11/2012  5:22 PM    ASK your doctor about these medications         albuterol 108 (90 BASE) MCG/ACT inhaler  Commonly known as: PROVENTIL HFA;VENTOLIN HFA   Inhale 2 puffs into the lungs every 6 (six) hours as needed. For shortness of breath      cephALEXin 500 MG capsule   Commonly known as: KEFLEX   Take 1 capsule (500 mg total) by mouth 4 (four) times daily.      flintstones complete 60 MG chewable tablet   Chew 2 tablets by mouth daily.          Briscoe Deutscher, DO 05/11/2012 5:22 PM  Pt's UC's closer and stronger. Requesting VE. 6/80/-2. Pos bloody show.   Admit to BS in active labor See H&P.  [place], CNM 05/11/2012 8:10 PM

## 2012-05-11 NOTE — Anesthesia Procedure Notes (Signed)
Epidural Patient location during procedure: OB  Preanesthetic Checklist Completed: patient identified, site marked, surgical consent, pre-op evaluation, timeout performed, IV checked, risks and benefits discussed and monitors and equipment checked  Epidural Patient position: sitting Prep: site prepped and draped and DuraPrep Patient monitoring: continuous pulse ox and blood pressure Approach: midline Injection technique: LOR air  Needle:  Needle type: Tuohy  Needle gauge: 17 G Needle length: 9 cm and 9 Needle insertion depth: 4 cm Catheter type: closed end flexible Catheter size: 19 Gauge Catheter at skin depth: 10 cm Test dose: negative  Assessment Events: blood not aspirated, injection not painful, no injection resistance, negative IV test and no paresthesia  Additional Notes Dosing of Epidural:  1st dose, through catheter ............................................. epi 1:200K + Xylocaine 40 mg  2nd dose, through catheter, after waiting 3 minutes.....epi 1:200K + Xylocaine 60 mg    ( 2% Xylo charted as a single dose in Epic Meds for ease of charting; actual dosing was fractionated as above, for saftey's sake)  As each dose occurred, patient was free of IV sx; and patient exhibited no evidence of SA injection.  Patient is more comfortable after epidural dosed. Please see RN's note for documentation of vital signs,and FHR which are stable.  Patient reminded not to try to ambulate with numb legs, and that an RN must be present when she attempts to get up.        

## 2012-05-11 NOTE — H&P (Signed)
HPI: Ashley Simon is a 27 y.o. G3P2002 at [redacted]w[redacted]d who presents to maternity admissions reporting increased abdominal pain and pressure today beginning in the AM. Pt was seen at [facility] earlier in the day today for similar complaints and was thought to be having irregular contractions 4/70/-3. Pt was sent home and continued to have some pain and pressure and was directed to come to [facility]U. Pt denies having fever, chills, sweats, lightheaded, dizziness.  + for irregular contractions, denies leakage of fluid or vaginal bleeding, and good fetal movement.  Denies trauma, recent sexual activity, or back pain/edema in legs.  Pregnancy Course:  Past Medical History:  Past Medical History   Diagnosis  Date   .  Asthma    .  Depression    .  Anxiety    .  Migraine     Past obstetric history:      OB History    Grav  Para  Term  Preterm  Abortions  TAB  SAB  Ect  Mult  Living    3  2  2        2       #  Outc  Date  GA  Lbr Len/2nd  Wgt  Sex  Del  Anes  PTL  Lv    1  TRM       SVD  EPI      2  TRM       SVD  EPI      3  CUR               Past Surgical History:  Past Surgical History   Procedure  Date   .  No past surgeries     Family History:  Family History   Problem  Relation  Age of Onset   .  Other  Neg Hx     Social History:  History   Substance Use Topics   .  Smoking status:  Former Smoker     Types:  Cigarettes   .  Smokeless tobacco:  Not on file   .  Alcohol Use:  No    Allergies:  Allergies   Allergen  Reactions   .  Calcium-Containing Compounds  Nausea And Vomiting    Meds:  Prescriptions prior to admission   Medication  Sig  Dispense  Refill   .  flintstones complete (FLINTSTONES) 60 MG chewable tablet  Chew 2 tablets by mouth daily.     Marland Kitchen  albuterol (PROVENTIL HFA;VENTOLIN HFA) 108 (90 BASE) MCG/ACT inhaler  Inhale 2 puffs into the lungs every 6 (six) hours as needed. For shortness of breath     .  [DISCONTINUED] cephALEXin (KEFLEX) 500 MG capsule  Take 1 capsule  (500 mg total) by mouth 4 (four) times daily.  28 capsule  0    ROS: Pertinent findings in history of present illness.  Physical Exam   Blood pressure 117/58, pulse 92, temperature 97.5 F (36.4 C), temperature source Oral, resp. rate 18, last menstrual period 08/18/2011.  GENERAL: Well-developed, well-nourished female in no acute distress.  HEENT: normocephalic  HEART: RRR  RESP: CTA  ABDOMEN: Soft, generalized nonspecific abdominal tenderness, no rebound or guarding, gravid appropriate for gestational age  EXTREMITIES: Nontender, no edema  NEURO: alert and oriented  Dilation: 6  Effacement (%): 80  Cervical Position: Middle  Station: -2  Presentation: Vertex  Exam by:: virginia smith cnm  FHT: Baseline 150 , moderate variability, accelerations present, no  decelerations  Contractions: 5-10 minutes  Labs:  No results found for this or any previous visit (from the past 24 hour(s)).  Assessment:  1) Early Latent Labor  Plan:  Discharge home  Labor precautions and fetal kick counts  Continue to follow with OB     Medication List      As of 05/11/2012 5:22 PM     ASK your doctor about these medications        albuterol 108 (90 BASE) MCG/ACT inhaler     Commonly known as: PROVENTIL HFA;VENTOLIN HFA     Inhale 2 puffs into the lungs every 6 (six) hours as needed. For shortness of breath     cephALEXin 500 MG capsule     Commonly known as: KEFLEX     Take 1 capsule (500 mg total) by mouth 4 (four) times daily.     flintstones complete 60 MG chewable tablet     Chew 2 tablets by mouth daily.      Briscoe Deutscher, DO  05/11/2012  5:22 PM  Pt's UC's closer and stronger. Requesting VE. 6/80/-2. Pos bloody show.  Admit to BS in active labor  See H&P.  Fellows, CNM  05/11/2012  8:10 PM

## 2012-05-12 ENCOUNTER — Encounter (HOSPITAL_COMMUNITY): Payer: Self-pay | Admitting: *Deleted

## 2012-05-12 LAB — RPR: RPR Ser Ql: NONREACTIVE

## 2012-05-12 MED ORDER — ONDANSETRON HCL 4 MG PO TABS: 4.0000 mg | ORAL_TABLET | ORAL | Status: DC | PRN

## 2012-05-12 MED ORDER — WITCH HAZEL-GLYCERIN EX PADS
1.0000 "application " | MEDICATED_PAD | CUTANEOUS | Status: DC | PRN
  Administered 2012-05-12: 1 via TOPICAL

## 2012-05-12 MED ORDER — BENZOCAINE-MENTHOL 20-0.5 % EX AERO
1.0000 "application " | INHALATION_SPRAY | CUTANEOUS | Status: DC | PRN
  Administered 2012-05-12: 1 via TOPICAL
  Filled 2012-05-12: qty 56

## 2012-05-12 MED ORDER — SIMETHICONE 80 MG PO CHEW: 80.0000 mg | CHEWABLE_TABLET | ORAL | Status: DC | PRN

## 2012-05-12 MED ORDER — ONDANSETRON HCL 4 MG/2ML IJ SOLN: 4.0000 mg | INTRAMUSCULAR | Status: DC | PRN

## 2012-05-12 MED ORDER — IBUPROFEN 600 MG PO TABS
600.0000 mg | ORAL_TABLET | Freq: Four times a day (QID) | ORAL | Status: DC
  Administered 2012-05-12 – 2012-05-13 (×6): 600 mg via ORAL
  Filled 2012-05-12 (×6): qty 1

## 2012-05-12 MED ORDER — SENNOSIDES-DOCUSATE SODIUM 8.6-50 MG PO TABS
2.0000 | ORAL_TABLET | Freq: Every day | ORAL | Status: DC
  Administered 2012-05-12: 2 via ORAL

## 2012-05-12 MED ORDER — LANOLIN HYDROUS EX OINT: TOPICAL_OINTMENT | CUTANEOUS | Status: DC | PRN

## 2012-05-12 MED ORDER — PRENATAL MULTIVITAMIN CH
1.0000 | ORAL_TABLET | Freq: Every day | ORAL | Status: DC
  Administered 2012-05-12 – 2012-05-13 (×2): 1 via ORAL
  Filled 2012-05-12 (×2): qty 1

## 2012-05-12 MED ORDER — TETANUS-DIPHTH-ACELL PERTUSSIS 5-2.5-18.5 LF-MCG/0.5 IM SUSP
0.5000 mL | Freq: Once | INTRAMUSCULAR | Status: AC
  Administered 2012-05-12: 0.5 mL via INTRAMUSCULAR
  Filled 2012-05-12: qty 0.5

## 2012-05-12 MED ORDER — PNEUMOCOCCAL VAC POLYVALENT 25 MCG/0.5ML IJ INJ
0.5000 mL | INJECTION | INTRAMUSCULAR | Status: AC
  Administered 2012-05-13: 0.5 mL via INTRAMUSCULAR
  Filled 2012-05-12: qty 0.5

## 2012-05-12 MED ORDER — MENTHOL 3 MG MT LOZG
1.0000 | LOZENGE | OROMUCOSAL | Status: DC | PRN
  Administered 2012-05-12 – 2012-05-13 (×2): 3 mg via ORAL
  Filled 2012-05-12 (×3): qty 9

## 2012-05-12 MED ORDER — DIPHENHYDRAMINE HCL 25 MG PO CAPS
25.0000 mg | ORAL_CAPSULE | Freq: Four times a day (QID) | ORAL | Status: DC | PRN
  Administered 2012-05-13: 25 mg via ORAL
  Filled 2012-05-12: qty 1

## 2012-05-12 MED ORDER — OXYCODONE-ACETAMINOPHEN 5-325 MG PO TABS
1.0000 | ORAL_TABLET | ORAL | Status: DC | PRN
  Administered 2012-05-12 – 2012-05-13 (×3): 1 via ORAL
  Filled 2012-05-12 (×3): qty 1

## 2012-05-12 MED ORDER — DIBUCAINE 1 % RE OINT
1.0000 "application " | TOPICAL_OINTMENT | RECTAL | Status: DC | PRN
  Administered 2012-05-12: 1 via RECTAL
  Filled 2012-05-12: qty 28

## 2012-05-12 MED ORDER — GUAIFENESIN 100 MG/5ML PO SOLN
200.0000 mg | ORAL | Status: DC | PRN
  Administered 2012-05-12 – 2012-05-13 (×6): 200 mg via ORAL
  Filled 2012-05-12 (×4): qty 15
  Filled 2012-05-12: qty 10
  Filled 2012-05-12 (×2): qty 15

## 2012-05-12 NOTE — H&P (Signed)
Attestation of Attending Supervision of Advanced Practitioner (CNM/NP): Evaluation and management procedures were performed by the Advanced Practitioner under my supervision and collaboration.  I have reviewed the Advanced Practitioner's note and chart, and I agree with the management and plan.  Maralyn Witherell 05/12/2012 9:31 AM   

## 2012-05-12 NOTE — Anesthesia Postprocedure Evaluation (Signed)
  Anesthesia Post-op Note  Patient: Ashley Simon  Procedure(s) Performed: * No procedures listed *  Patient Location: PACU and Mother/Baby  Anesthesia Type:Epidural  Level of Consciousness: awake, alert  and oriented  Airway and Oxygen Therapy: Patient Spontanous Breathing  Post-op Pain: mild  Post-op Assessment: Post-op Vital signs reviewed and Patient's Cardiovascular Status Stable  Post-op Vital Signs: Reviewed and stable  Complications: No apparent anesthesia complications

## 2012-05-12 NOTE — MAU Provider Note (Signed)
Attestation of Attending Supervision of Advanced Practitioner (CNM/NP): Evaluation and management procedures were performed by the Advanced Practitioner under my supervision and collaboration.  I have reviewed the Advanced Practitioner's note and chart, and I agree with the management and plan.  Xzaviar Maloof 05/12/2012 9:32 AM

## 2012-05-12 NOTE — Progress Notes (Signed)
UR chart review completed.  

## 2012-05-13 MED ORDER — DOCUSATE SODIUM 100 MG PO CAPS: 100.0000 mg | ORAL_CAPSULE | Freq: Two times a day (BID) | ORAL | Status: DC | PRN

## 2012-05-13 MED ORDER — IBUPROFEN 600 MG PO TABS: 600.0000 mg | ORAL_TABLET | Freq: Four times a day (QID) | ORAL | Status: DC

## 2012-05-13 NOTE — Discharge Summary (Signed)
`   Obstetric Discharge Summary Reason for Admission: onset of labor Prenatal Procedures: none Intrapartum Procedures: vacuum Patient fully dilated and pushing. Vacuum applied at +2 station secondary to fetal heart rate tracing demonstrating deep variables to 90's with rebound tachycardia to 180's without return to baseline. Delivery of viable female infant with Apgars 8 and 8 and weight 6 lb11oz over intact perineum from left OP position. Postpartum Procedures: none Complications-Operative and Postpartum: none Hemoglobin  Date Value Range Status  05/11/2012 11.5* 12.0 - 15.0 g/dL Final     HCT  Date Value Range Status  05/11/2012 35.8* 36.0 - 46.0 % Final    Physical Exam:  General: alert, cooperative, appears stated age and no distress Lochia: appropriate Uterine Fundus: firm DVT Evaluation: No evidence of DVT seen on physical exam. Negative Homan's sign. No cords or calf tenderness. No significant calf/ankle edema. Heart: RRR Lungs: Clear and equal breath sounds in all lobes  Discharge Diagnoses: Term Pregnancy-delivered  Discharge Information: Date: 05/13/2012 Activity: pelvic rest Diet: routine Medications: Ibuprofen Condition: stable Instructions: refer to practice specific booklet Discharge to: home Follow-up Information    Follow up with [facility]. In 6 weeks.   Contact information:   [address] [place] [address] [phone]       Patient wants a Nuvaring for contraception   Newborn Data: Live born female  Birth Weight: 6 lb 11 oz (3033 g) APGAR: 8, 8  Home with mother.  Ashley Simon 05/13/2012, 7:33 AM  I have seen this patient and agree with the above resident's note.  LEFTWICH-KIRBY, Lucienne Sawyers Certified Nurse-Midwife

## 2012-05-13 NOTE — Discharge Instructions (Signed)
Before [facility] Ask any questions about feeding, diapering, and baby care before you leave the hospital. Ask again if you do not understand. Ask when you need to see the doctor again. There are several things you must have before your baby comes home.  Infant car seat.  Crib.  Do not let your baby sleep in a bed with you or anyone else.  If you do not have a bed for your baby, ask the doctor what you can use that will be safe for the baby to sleep in. Infant feeding supplies:  6 to 8 bottles (8 oz. size).  6 to 8 nipples.  Measuring cup.  Measuring tablespoon.  Bottle brush.  Sterilizer (or use any large pan or kettle with a lid).  Formula that contains iron.  A way to boil and cool water. Breastfeeding supplies:  Breast pump.  Nipple cream. Clothing:  24 to 36 cloth diapers and waterproof diaper covers or a box of disposable diapers. You may need as many as 10 to 12 diapers per day.  3 onesies (other clothing will depend on the time of year and the weather).  3 receiving blankets.  3 baby pajamas or gowns.  3 bibs. Bath equipment:  Mild soap.  Petroleum jelly. No baby oil or powder.  Soft cloth towel and wash cloth.  Cotton balls.  Separate bath basin for baby. Only sponge bathe until umbilical cord and circumcision are healed. Other supplies:  Thermometer and bulb syringe (ask the hospital to send them home with you). Ask your doctor about how you should take your baby's temperature.  One to two pacifiers. Prepare for an emergency:  Know how to get to the hospital and know where to admit your baby.  Put all doctor numbers near your house phone and in your cell phone if you have one. Prepare your family:  Talk with siblings about the baby coming home and how they feel about it.  Decide how you want to handle visitors and other family members.  Take offers for help with the baby. You will need time to adjust. Know when to call the doctor.   GET HELP RIGHT AWAY IF:  Your baby's temperature is greater than 100.4 F (38 C).  The softspot on your baby's head starts to bulge.  Your baby is crying with no tears or has no wet diapers for 6 hours.  Your baby has rapid breathing.  Your baby is not as alert. Document Released: 03/20/2008 Document Revised: 06/30/2011 Document Reviewed: 06/27/2010 [facility] Patient Information 2013 [place], [place]. Caring for Common Problems of Your Infant at Home Call your clinic to make a follow-up visit for your infant as told by your caregiver. You should make an appointment for your baby to be seen at 21 weeks of age for a well baby visit, unless your caregiver wants to see you sooner. For appointments, bring:  A diaper and a change of clothes.  A bottle of formula if your baby is bottle-fed, or a bottle of water if your baby is breastfed. If you have questions, please write them down. Bring your list with you when the baby is examined. If something seems unusual or you are worried about a problem, call your caregiver. COMMON QUESTIONS What are the white spots on my baby's face? Both neonatal acne and milia are common in the newborns. Both conditions are normal for newborns, and both usually resolve on their own in 6 to 8 weeks.  What should I  do for diaper rash? If there is diaper rash for more than 3 days, you can treat it with an over-the-counter cream, powder, or ointment for a fungal infection. If there is no improvement within 3 days, call your caregiver or make an appointment for your baby to be seen. How much pain medication should I give my baby? Do not give any medication to your baby until at least 31 weeks of age and then only after checking with your caregiver. What is fever? Fever in a newborn or infant younger than 2 months can be very serious. Call your doctor if:   Your baby is 50 months old or younger with a rectal temperature of 100.4 F (38 C) or higher.  Your baby is older  than 3 months with a rectal temperature of 102 F (38.9 C) or higher.  You think your baby has a fever and you are not able to measure it. What are the white spots in my baby's mouth? It is common to see thrush in newborns. This condition is causing the white spots. This condition is not serious. The white spots are a very mild fungal infection that can be easily wiped off and treated with over-the-counter or prescription medications.  SAFETY Accidents are the leading and most preventable cause of death for children. Consider these safety tips:  Your child should ride in a rear-facing car seat until your doctor tells you that your child can face forward. Be sure to have your seat checked to see if it is appropriately installed. Never allow your infant to ride in the front seat.  There should only be 2 3/8 inches (5.08 centimeters) between slats in your child's crib. An older crib may have spaces that are too big. The mattress should fit snugly so that your child will not get trapped between the crib and mattress. Do not place blankets or large stuffed animals in the crib that could smother your baby.  Always place your baby on his or her back to decrease risk of sudden infant death syndrome (SIDS). Vomiting In Infants and Newborns Forceful vomiting in newborns and young infants is not normal and may be serious, especially if associated with:  Fever (temperature greater than 100.23F [38C]).  Weight loss.  Irritability, decreased activity, or crying for a prolonged period.  Not eating.  Vomit that looks green or yellow (bilious) or is forceful.  Hunger after vomiting.  Signs of abdominal pain. If your baby is vomiting and has any of the above symptoms, call your caregiver. Most of the time vomiting is not serious and may just represent gastroesophageal reflux disease (GERD). Gastroesophageal reflux disease is normal in newborn and infants, and your child may be what caregivers call "a  happy spitter". This is when your baby painlessly and effortlessly spits up their milk but appears perfectly happy. This will gradually improve as your baby gets older. Most infants with GERD will gain weight appropriately and not develop any problems. If you are concerned about GERD affecting your baby, you can discuss the following lifestyle changes with your caregiver:  Changing formula.  Changing how you position your baby when you place them down.  Breastfeeding.  Thickening your baby's feeds. Less commonly, vomiting in babies can be caused by an allergy to a protein in their formula called milk protein allergy. Most often newborns and infants with milk protein allergy are irritable and have bloody diarrhea, but they can also have vomiting.  Another condition called pyloric stenosis occurs in  about 3 of every 1,000 births. In this condition infants have forceful or projectile vomit. Parents often describe this as vomit "shooting" across the room. Shortly after vomiting, infants appear to be very hungry. If your baby's belly appears swollen or you see what looks like a green or yellow fluid in the vomit, your baby could have twisted and blocked bowels. This requires prompt evaluation by your caregiver. Vomiting In Older Infants Vomiting and diarrhea in infants may occur with infections. The most common cause of vomiting in older infants is gastroenteritis, usually caused by a viral infection. However, a sore throat, ear infection, or bladder infection can also cause vomiting.  If your infant is between 6 months and 41 months old and suddenly gets crampy, abdominal pain and vomiting that progressively worsens, call your caregiver. Older infants are at risk for a type of intestinal obstruction (intussusception). Also, if vomiting is associated with a headache, you should discuss this with your caregiver. If vomiting or diarrhea occur in large amounts, and the baby is not taking enough fluids, the  baby may lose too much body water and body salts and become dehydrated (loss of body fluids). Suspect dehydration if:   The eyes look sunken in.  The tongue and mouth are dry.  Diaper wetting decreases. Babies younger than 3 months require special care and close watching because they lose body water and become dehydrated a lot faster. True vomiting is when food is brought up from the stomach. This is different from when babies "spit up" small amounts. The most common cause of diarrhea in infants is intolerance to the protein in the formula. It is most important to prevent dehydration in infants. Dehydration can come on quicker when there is both vomiting and diarrhea.  Diarrhea In Newborns And Older Infants Many parents often confuse diarrhea with normal baby stools. Normal baby stools are soft and loose. Your baby may have a stool after each feeding during the first 2 months of life, especially breastfed babies. As a result, determining what is diarrhea and what is normal baby stool can sometimes be hard for parents. Diarrhea is watery stools, not just one or two loose stools during the day but several.  You should be concerned about changes such as stools that are more frequent or watery. Realize that babies stools may change as a result of the use of antibiotics or a change in diet. Additionally, if you are breastfeeding, similar changes by you, such as changes in your diet, could affect your babies stools. As infants get older, diarrhea can be caused the infections. The most common infection is caused by rotavirus for which there is now a vaccination. In young infants, the main concern is dehydration. If your baby is 25 months old or younger and you suspect he or she has has diarrhea, call your caregiver. Call your caregiver anytime you see pus or blood in your baby's stool or if your baby has fever and diarrhea last more than 3 days. What To Do Infants Breastfed babies have stools that are more  loose than formula-fed babies. If your baby is breastfed and develops diarrhea, continue to breastfeed, unless your doctor tells you to stop, and monitor their urine output closely. If your baby urinates less often than normal or you have to change fewer diapers, call your caregiver. Breast milk is more easily digested than any other fluid and can be used for mild dehydration. You can breastfeed for 5 minutes every 30 minutes. If your baby  does not vomit for 2 hours go back to your regular schedule. Guidelines for replacing fluid: Replace any fluid lost through diarrhea or vomiting with  to  cup or 2 to 4 oz (60 to 120 ml) of oral rehydration solution (ORS) for each diarrheal stool or vomiting episode. If there is no vomiting for 2 hours go back to your normal feeding schedule. Older infants Older infants can continue to eat if they want to, as long as you monitor them for signs of dehydration. Encourage older infants to continue to drink fluids even if they do not want to eat but avoid giving them large amounts of any drinks with a high sugar content, including juices and soda. The best fluids are commercially available ORSs. Guidelines for replacing fluid: Replace any fluid lost through diarrhea or vomiting with ORS as follows:   If your baby weighs 22 lb or less (10 kg or less), give him or her  to  cup or 2 to 4 oz (60 to 120 ml) of ORS for each diarrheal stool or vomiting episode.  If your baby weighs more than more than 22 lb (10 kg), give him or her  to 1 cup or 4 to 8 oz (120 to 240 ml) of ORS for each diarrheal stool or vomiting episode. If your baby continues to vomit even these small amounts or continues to have diarrhea in spite of treatment, contact your caregiver. Colic All babies experience fussiness. Fussiness that occurs for an extended period or becomes uncontrollable is referred to as colic. Babies with colic will differ in the:  Amount of symptoms.  Duration of colic. The  cause of colic is not known. Colic can occur in either breastfed or bottle-fed babies. It is usually worse in the late afternoon or evening. Colic often happens between 3 weeks and 3 months of life and usually goes away after that time. If your baby is 64 weeks old or younger and has colic symptoms talk with your caregiver. The cause of colic is unknown, but there may be several factors involved. A baby who swallows a lot of air and does not burp easily may develop colicky symptoms. Colic also may be secondary to rapid feeding or over feeding. Sometimes a change your baby's diet, including formula, will cause him or her to have colic. Colic is not a serious medical condition. Your baby will eat, grow, and gain weight with no long term affects. Symptoms  Your baby may have facial redness (flushing), arch his or her back, pull his or her arms and legs up to the belly, have a tense belly (abdomen), cry loudly with fists clenched, and generally seem irritable and fussy.  Usually there is no weight loss, fever, diarrhea, or vomiting.  Symptoms may last as long as 3 hours per day on more than 3 days of the week. Symptoms often occur at the same time of day.  Symptoms improve as he or she tires himself or herself out.  Symptoms generally begin to improve after 6 weeks.  If your baby is older than 3 months and symptoms continue, talk with your caregiver about other possible diagnoses.  Happy spitters do not benefit from medications for GERD. What Can Be Done About Colic?   Be sure your baby has burped after each feeding.  Provide a quiet, calm place for your baby. Avoid stress and tension. Many parents find holding their baby is an effective way to comfort him or her. Gentle rocking or swinging are  also effective. Singing lullabies or playing music can soothe your baby. Pacifiers allow babies to suck, which can be comforting. Place your baby on his or her tummy and rub his or her back, but do not let him  or her sleep on their belly.  Your caregiver might recommend changing formulas. Your caregiver may want you to change from an iron-fortified formula to a plain or soy bean-based formula.  Colic can be very frustrating and cause extra stress on the parents. It is important to get help and support from family and friends. It is important to find counseling if necessary. Be observant. Do you notice symptoms after feeding or certain medications? Avoid overfeeding or feeding too quickly.  If the condition persists or if the child vomits, has a fever, diarrhea, or bloody stools, call your caregiver. Medication might sometimes be ordered in cases of severe colic. Diaper Rash Diaper rash is a common condition in infants. Do not become alarmed if your infant has a mild rash. You can initially treat it with over-the-counter products for rashes that are present for 3 days. If there is not any improvement after 3 days of treatment, discuss other treatment options with your caregiver. Causes  Too much moisture.  Urine and stool are left touching the skin for a long time.  Infection.  Allergy to the diaper.  Diarrhea.  Babies begin eating solid food.  Antibiotic use or nursing mothers taking antibiotics. What Can Be Done About Diaper Rash?  Change your baby's diapers more often.  Wash your baby's buttocks with warm water and mild soap after each bowel movement. Dry the skin well and be sure all of the soap is removed. Wipe your baby with water and a clean cloth after each urination.  Expose your baby's buttocks to air for 10 minutes many times a day or leave the diaper off during your baby's nap.  Avoid the use of rubber or plastic pants. These trap in moisture and can cause irritation. Sometimes the elastic band at the top of the pants causes irritation and a rash.  Consider changing the type of diaper you use.  Sometimes a baby's skin will react to various types of commercial baby wipes.  Avoid using wipes that can dry the skin. Consider using a clean cloth with water or a paper towel for a time to see if this helps clear up the rash.  If your baby's skin is irritated, use a barrier paste-like zinc oxide or petroleum jelly on your baby's buttocks after washing it. Other ointments may also be used. However, do not use creams with steroids without your caregiver's permission.  Use 2 regular diapers or extra-absorbent disposable diapers at night and nap times. If you have tried all of these suggestions and have not been successful or if your baby's rash is severe ,with sores, pus, fever, or bleeding, see your caregiver. Your baby could have an infection causing the rash. The rash should clear up with proper medication. Constipation Causes  The most common constipation in infants is functional constipation. This means there is no medical problem. In babies not yet eating solid foods, it is most often caused by a lack of fluid.  Older infants on solid foods can get constipated due to:  A lack of fluid.  A lack of bulk (fiber).  Some babies have brief constipation when switching from breast milk to formula or from formula to cow's milk.  Constipation can be a side effect of medicine, but this is  uncommon in infants.  Constipation that starts at or right after birth can sometimes be a sign of problems, such as problems with the intestine or the anus. Possible Solution:  Use pediatric glycerine suppositories as directed by your caregiver. You insert these gently into your infant's rectum, and often they will cause your baby to expel stool with the suppository shortly thereafter. Do not become alarmed if your baby's stooling pattern changes as long, as he or she seems to be content. None of these remedies need to be done unless your child has gone 4 or 5 days without having a bowel movement and seems to be experiencing some abdominal pain as a result of this. Normal stool for  bottle-fed and breastfed babies often will be:  Mustard color or sometimes green.  A stain on diapers to loose, unformed, pea soup consistency.  Yellowish green to brownish in color.  Mild in odor or not unpleasant. Green and watery stools are not always a concern in an otherwise healthy baby. SEEK IMMEDIATE MEDICAL CARE IF:  Your baby has the following symptoms and is younger than 33 months old.  If diarrhea and vomiting are accompanied by other signs of infection. These signs include:  Pulling ears.  Sore throat.  Crying when wetting.  Your baby is 61 months old or younger, with a rectal temperature of 100.4 F (38 C) or higher.  Your baby is older than 3 months, with a rectal temperature of 102 F (38.9 C) or higher.  Blood or pus in the stool.  Vomit is forceful or projectile.  Your baby develops signs of dehydration with fever:  Sunken eyes.  Dry mouth.  Weight loss.  Irritability.  Drowsiness (lethargic).  Decrease in urination. If your baby does not urinate in an 8-hour to 12-hour period, contact your physician.  Your baby has diaper rash that will not clear up after 3 days of treatment or has sores, pus, or bleeding.  Your baby will not take fluids as recommended, if the vomiting or diarrhea is persistent, or if the baby seems ill.  Your baby has not had a bowel movement in 4 to 5 days.  You need help with your baby because you cannot control your baby's crying because of colic. Document Released: 04/04/2000 Document Revised: 06/30/2011 Document Reviewed: 01/30/2009 [facility] Patient Information 2013 [place], [place]. Breastfeeding Deciding to breastfeed is one of the best choices you can make for you and your baby. The information that follows gives a brief overview of the benefits of breastfeeding as well as common topics surrounding breastfeeding. BENEFITS OF BREASTFEEDING For the baby  The first milk (colostrum) helps the baby's digestive system  function better.   There are antibodies in the mother's milk that help the baby fight off infections.   The baby has a lower incidence of asthma, allergies, and sudden infant death syndrome (SIDS).   The nutrients in breast milk are better for the baby than infant formulas, and breast milk helps the baby's brain grow better.   Babies who breastfeed have less gas, colic, and constipation.  For the mother  Breastfeeding helps develop a very special bond between the mother and her baby.   Breastfeeding is convenient, always available at the correct temperature, and costs nothing.   Breastfeeding burns calories in the mother and helps her lose weight that was gained during pregnancy.   Breastfeeding makes the uterus contract back down to normal size faster and slows bleeding following delivery.   Breastfeeding mothers have  a lower risk of developing breast cancer.  BREASTFEEDING FREQUENCY  A healthy, full-term baby may breastfeed as often as every hour or space his or her feedings to every 3 hours.   Watch your baby for signs of hunger. Nurse your baby if he or she shows signs of hunger. How often you nurse will vary from baby to baby.   Nurse as often as the baby requests, or when you feel the need to reduce the fullness of your breasts.   Awaken the baby if it has been 3 4 hours since the last feeding.   Frequent feeding will help the mother make more milk and will help prevent problems, such as sore nipples and engorgement of the breasts.  BABY'S POSITION AT THE BREAST  Whether lying down or sitting, be sure that the baby's tummy is facing your tummy.   Support the breast with 4 fingers underneath the breast and the thumb above. Make sure your fingers are well away from the nipple and baby's mouth.   Stroke the baby's lips gently with your finger or nipple.   When the baby's mouth is open wide enough, place all of your nipple and as much of the areola as  possible into your baby's mouth.   Pull the baby in close so the tip of the nose and the baby's cheeks touch the breast during the feeding.  FEEDINGS AND SUCTION  The length of each feeding varies from baby to baby and from feeding to feeding.   The baby must suck about 2 3 minutes for your milk to get to him or her. This is called a "let down." For this reason, allow the baby to feed on each breast as long as he or she wants. Your baby will end the feeding when he or she has received the right balance of nutrients.   To break the suction, put your finger into the corner of the baby's mouth and slide it between his or her gums before removing your breast from his or her mouth. This will help prevent sore nipples.  HOW TO TELL WHETHER YOUR BABY IS GETTING ENOUGH BREAST MILK. Wondering whether or not your baby is getting enough milk is a common concern among mothers. You can be assured that your baby is getting enough milk if:   Your baby is actively sucking and you hear swallowing.   Your baby seems relaxed and satisfied after a feeding.   Your baby nurses at least 8 12 times in a 24 hour time period. Nurse your baby until he or she unlatches or falls asleep at the first breast (at least 10 20 minutes), then offer the second side.   Your baby is wetting 5 6 disposable diapers (6 8 cloth diapers) in a 24 hour period by 67 6 days of age.   Your baby is having at least 3 4 stools every 24 hours for the first 6 weeks. The stool should be soft and yellow.   Your baby should gain 4 7 ounces per week after he or she is 82 days old.   Your breasts feel softer after nursing.  REDUCING BREAST ENGORGEMENT  In the first week after your baby is born, you may experience signs of breast engorgement. When breasts are engorged, they feel heavy, warm, full, and may be tender to the touch. You can reduce engorgement if you:   Nurse frequently, every 2 3 hours. Mothers who breastfeed early and  often have fewer problems with  engorgement.   Place light ice packs on your breasts for 10 20 minutes between feedings. This reduces swelling. Wrap the ice packs in a lightweight towel to protect your skin. Bags of frozen vegetables work well for this purpose.   Take a warm shower or apply warm, moist heat to your breast for 5 10 minutes just before each feeding. This increases circulation and helps the milk flow.   Gently massage your breast before and during the feeding. Using your finger tips, massage from the chest wall towards your nipple in a circular motion.   Make sure that the baby empties at least one breast at every feeding before switching sides.   Use a breast pump to empty the breasts if your baby is sleepy or not nursing well. You may also want to pump if you are returning to work oryou feel you are getting engorged.   Avoid bottle feeds, pacifiers, or supplemental feedings of water or juice in place of breastfeeding. Breast milk is all the food your baby needs. It is not necessary for your baby to have water or formula. In fact, to help your breasts make more milk, it is best not to give your baby supplemental feedings during the early weeks.   Be sure the baby is latched on and positioned properly while breastfeeding.   Wear a supportive bra, avoiding underwire styles.   Eat a balanced diet with enough fluids.   Rest often, relax, and take your prenatal vitamins to prevent fatigue, stress, and anemia.  If you follow these suggestions, your engorgement should improve in 24 48 hours. If you are still experiencing difficulty, call your lactation consultant or caregiver.  CARING FOR YOURSELF Take care of your breasts  Bathe or shower daily.   Avoid using soap on your nipples.   Start feedings on your left breast at one feeding and on your right breast at the next feeding.   You will notice an increase in your milk supply 2 5 days after delivery. You may feel  some discomfort from engorgement, which makes your breasts very firm and often tender. Engorgement "peaks" out within 24 48 hours. In the meantime, apply warm moist towels to your breasts for 5 10 minutes before feeding. Gentle massage and expression of some milk before feeding will soften your breasts, making it easier for your baby to latch on.   Wear a well-fitting nursing bra, and air dry your nipples for a 3 after each feeding.   Only use cotton bra pads.   Only use pure lanolin on your nipples after nursing. You do not need to wash it off before feeding the baby again. Another option is to express a few drops of breast milk and gently massage it into your nipples.  Take care of yourself  Eat well-balanced meals and nutritious snacks.   Drinking milk, fruit juice, and water to satisfy your thirst (about 8 glasses a day).   Get plenty of rest.  Avoid foods that you notice affect the baby in a bad way.  SEEK MEDICAL CARE IF:   You have difficulty with breastfeeding and need help.   You have a hard, red, sore area on your breast that is accompanied by a fever.   Your baby is too sleepy to eat well or is having trouble sleeping.   Your baby is wetting less than 6 diapers a day, by 104 days of age.   Your baby's skin or white part of his or her  eyes is more yellow than it was in the hospital.   You feel depressed.  Document Released: 04/07/2005 Document Revised: 10/07/2011 Document Reviewed: 07/06/2011 [facility] Patient Information 2013 [place], [place]. Newborn Baby Care BATHING YOUR BABY  Babies only need a bath 2 to 3 times a week. If you clean up spills and spit up and keep the diaper clean, your baby will not need a bath more often. Do not give your baby a tub bath until the umbilical cord is off and the belly button has normal looking skin. Use a sponge bath only.  Pick a time of the day when you can relax and enjoy this special time with your baby. Avoid  bathing just before or after feedings.  Wash your hands with warm water and soap. Get all of the needed equipment ready for the baby.  Equipment includes:  Basin of warm water (always check to be sure it is not too hot).  Mild soap and baby shampoo.  Soft washcloth and towel (may use cloth diaper).  Cotton balls.  Clean clothes and blankets.  Diapers.  Never leave your baby alone on a high suface where the baby can roll off.  Always keep 1 hand on your baby when giving a bath. Never leave your baby alone in a bath.  To keep your baby warm, cover your baby with a cloth except where you are sponge bathing.  Start the bath by cleansing each eye with a separate corner of the cloth or separate cotton balls. Stroke from the inner corner of the eye to the outer corner, using clear water only. Do not use soap on your baby's face. Then, wash the rest of your baby's face.  It is not necessary to clean the ears or nose with cotton-tipped swabs. Just wash the outside folds of the ears and nose. If mucus collects in the nose that you can see, it may be removed by twisting a wet cotton ball and wiping the mucus away. Cotton-tipped swabs may injure the tender inside of the nose.  To wash the head, support the baby's neck and head with your hand. Wet the hair, then shampoo with a small amount of baby shampoo. Rinse thoroughly with warm water from a washcloth. If there is cradle cap, gently loosen the scales with a soft brush before rinsing.  Continue to wash the rest of the body. Gently clean in and around all the creases and folds. Remove the soap completely. This will help prevent dry skin.  For girls, clean between the folds of the labia using a cotton ball soaked with water. Stroke downward. Some babies have a bloody discharge from the vagina (birth canal). This is due to the sudden change of hormones following birth. There may be a white discharge also. Both are normal. For boys, follow  circumcision care instructions. UMBILICAL CORD CARE The umbilical cord should fall off and heal by 2 to 3 weeks of life. Your newborn should receive only sponge baths until the umbilical cord has fallen off and healed. The umbilical cord and area around the stump do not need specific care, but should be kept clean and dry. If the umbilical stump becomes dirty, it can be cleaned with plain water and dried by placing cloth around the stump. Folding down the front part of the diaper can help dry out the base of the cord. This may make it fall off faster. You may notice a foul odor before it falls off. When the cord comes  off and the skin has sealed over the navel, the baby can be placed in a bathtub. Call your caregiver if your baby has:  Redness around the umbilical area.  Swelling around the umbilical area.  Discharge from the umbilical stump.  Pain when you touch the belly. CIRCUMCISION CARE  If your baby boy was circumcised:  There may be a strip of petroleum jelly gauze wrapped around the penis. If so, remove this after 24 hours or sooner if soiled with stool.  Wash the penis gently with warm water and a soft cloth or cotton ball and dry it. You may apply petroleum jelly to his penis with each diaper change, until the area is well healed. Healing usually takes 2 to 3 days.  If a plastic ring circumcision was done, gently wash and dry the penis. Apply petroleum jelly several times a day or as directed by your baby's caregiver until healed. The plastic ring at the end of the penis will loosen around the edges and drop off within 5 to 8 days after the circumcision was done. Do not pull the ring off.  If the plastic ring has not dropped off after 8 days or if the penis becomes very swollen and has drainage or bright red bleeding, call your caregiver.  If your baby was not circumcised, do not pull back the foreskin. This will cause pain, as it is not ready to be pulled back. The inside of the  foreskin does not need cleaning. Just clean the outer skin. COLOR  A small amount of bluishness of the hands and feet is normal for a newborn. Bluish or grayish color of the baby's face or body is not normal. Call for medical help.  Newborns can have many normal birthmarks on their bodies. Ask your baby's nurse or caregiver about any you find.  When crying, the newborn's skin color often becomes deep red. This is normal.  Jaundice is a yellowish color of the skin or in the white part of the baby's eyes. If your baby is becoming jaundiced, call your baby's caregiver. BOWEL MOVEMENTS The baby's first bowel movements are sticky, greenish black stools called meconium. The first bowel movement normally occurs within the first 36 hours of life. The stool changes to a mustard-yellow loose stool if the baby is breastfed or a thicker yellow-tan stool if the baby is fed formula. Your baby may make stool after each feeding or 4 to 5 times per day in the first weeks after birth. Each baby is different. After the first month, stools of breastfed babies become less frequent, even fewer than 1 a day. Formula-fed babies tend to have at least 1 stool per day.  Diarrhea is defined as many watery stools in a day. If the baby has diarrhea you may see a water ring surrounding the stool on the diaper. Constipation is defined as hard stools that seem to be painful for the baby to pass. However, most newborns grunt and strain when passing any stool. This is normal. GENERAL CARE TIPS   Babies should be placed to sleep on their backs unless your caregiver has suggested otherwise. This is the single most important thing you can do to reduce the risk of sudden infant death syndrome.  Do not use a pillow when putting the baby to sleep.  Fingers and toenails should be cut while the baby is sleeping, if possible, and only after you can see a distinct separation between the nail and the skin under it.  It is not necessary to  take the baby's temperature daily. Take it only when you think the skin seems warmer than usual or if the baby seems sick. (Take it before calling your caregiver.) Lubricate the thermometer with petroleum jelly and insert the bulb end approximately  inch into the rectum. Stay with the baby and hold the thermometer in place 2 to 3 minutes by squeezing the cheeks together.  The disposable bulb syringe used on your baby will be sent home with you. Use it to remove mucus from the nose if your baby gets congested. Squeeze the bulb end together, insert the tip very gently into one nostril, and let the bulb expand. It will suck mucus out of the nostril. Empty the bulb by squeezing out the mucus into a sink. Repeat on the second side. Wash the bulb syringe well with soap and water, and rinse thoroughly after each use.  Do not over dress the baby. Dress him or her according to the weather. One extra layer more than what you are wearing is a good guideline. If the skin feels warm and damp from perspiring, your baby is too warm and will be restless.  It is not recommended that you take your infant out in crowded public areas (such as shopping malls) until the baby is several weeks old. In crowds of people, the baby will be exposed to colds, virus, and diseases. Avoid children and adults who are obviously sick. It is good to take the infant out into the fresh air.  It is not recommended that you take your baby on long-distance trips before your baby is 3 to 24 months old, unless it is necessary.  Microwaves should not be used for heating formula. The bottle remains cool, but the formula may become very hot. Reheating breast milk in a microwave reduces or eliminates natural immunity properties of the milk. Many infants will tolerate frozen breast milk that has been thawed to room temperature without additional warming. If necessary, it is more desirable to warm the thawed milk in a bottle placed in a pan of warm water.  Be sure to check the temperature of the milk before feeding.  Wash your hands with hot water and soap after changing the baby's diaper and using the restroom.  Keep all your baby's doctor appointments and scheduled immunizations. SEEK MEDICAL CARE IF:  The cord stump does not fall off by the time the baby is 29 weeks old. SEEK IMMEDIATE MEDICAL CARE IF:   Your baby is 71 months old or younger with a rectal temperature of 100.4 F (38 C) or higher.  Your baby is older than 3 months with a rectal temperature of 102 F (38.9 C) or higher.  The baby seems to have little energy or is less active and alert when awake than usual.  The baby is not eating.  The baby is crying more than usual or the cry has a different tone or sound to it.  The baby has vomited more than once (most babies will spit up with burping, which is normal).  The baby appears to be ill.  The baby has diaper rash that does not clear up in 3 days after treatment, has sores, pus, or bleeding.  There is active bleeding at the umbilical cord site. A small amount of spotting is normal.  There has been no bowel movement in 4 days.  There is persistent diarrhea or blood in the stool.  The baby has bluish or gray  looking skin.  There is yellow color to the baby's eyes or skin. Document Released: 04/04/2000 Document Revised: 06/30/2011 Document Reviewed: 10/25/2007 [facility] Patient Information 2013 [place], [place]. Breast Pumping Tips Pumping breast milk is a good way produce more milk and a steady supply for your infant. In general, the more you breastfeed or pump, the more milk you will produce. Talk to your doctor or breastfeeding specialist if you need more information or support. HOME CARE  Drink enough fluids to keep your pee (urine) clear or pale yellow.  Eat a healthy diet.  Exercise as told by your doctor.  Rest often. Sleep when your infant sleeps.  Do not smoke.  Ask your doctor about birth control  options. Pumping breastmilk:  Relax and find a quiet place to pump. Breast massage, soothing heat on your breasts, music, pictures, or tape recordings of your infant may help you relax.  Place the suction cup of the pump right over the nipple.  Some discomfort is normal at first. If pumping continues to be painful, you may need a different pump. Talk to your breastfeeding specialist.  Put lanolin ointment on sore nipples and the areola.  Pump after each feeding session. This will boost your milk supply.  If you are away from your infant for several hours, pump for 15 minutes every 2 to 3 hours. Pump both breasts.  If your infant has a formula feeding, pump around the same time.  Pump a few weeks before you go back to work. This will help you find a routine that works for you. GET HELP RIGHT AWAY IF:   You are having trouble pumping or feeding your infant.  You think you are not making enough milk.  You have nipple pain, soreness, or redness.  You have other questions or concerns. MAKE SURE YOU:   Understand these instructions.  Will watch your condition.  Will get help right away if you are not doing well or get worse. Document Released: 09/24/2007 Document Revised: 06/30/2011 Document Reviewed: 09/24/2007 [facility] Patient Information 2013 [place], [place]. Postpartum Care After Vaginal Delivery After you deliver your baby, you will stay in the hospital for 24 to 72 hours, unless there were problems with the labor or delivery, or you have medical problems. While you are in the hospital, you will receive help and instructions on how to care for yourself and your baby. Your doctor will order pain medicine, in case you need it. You will have a small amount of bleeding from your vagina and should change your sanitary pad frequently. Wash your hands thoroughly with soap and water for at least 20 seconds after changing pads and using the toilet. Let the nurses know if you begin to pass  blood clots or your bleeding increases. Do not flush blood clots down the toilet before having the nurse look at them, to make sure there is no placental tissue with them. If you had an intravenous (IV), it will be removed within 24 hours, if there are no problems. The first time you get out of bed or take a shower, call the nurse to help you because you may get weak, lightheaded, or even faint. If you are breastfeeding, you may feel painful contractions of your uterus for a couple of weeks. This is normal. The contractions help your uterus get back to normal size. If you are not breastfeeding, wear a supportive bra and handle your breasts as little as possible until your milk has dried up. Hormones should not  be given to dry up the breasts, because they can cause blood clots. You will be given your normal diet, unless you have diabetes or other medical problems.  The nurses may put an ice pack on your episiotomy (surgically enlarged opening), if you have one, to reduce the pain and swelling. On rare occasions, you may not be able to urinate and the nurse will need to empty your bladder with a catheter. If you had a postpartum tubal ligation ("tying tubes," female sterilization), it should not make your stay in the hospital longer. You may have your baby in your room with you as much as you like, unless you or the baby has a problem. Use the bassinet (basket) for the baby when going to and from the nursery. Do not carry the baby. Do not leave the postpartum area. If the mother is Rh negative (lacks a protein on the red blood cells) and the baby is Rh positive, the mother should get a Rho-gam shot to prevent Rh problems with future pregnancies. You may be given written instructions for you and your baby, and necessary medicines, when you are discharged from the hospital. Be sure you understand and follow the instructions as advised. HOME CARE INSTRUCTIONS   Follow instructions and take the medicines given to  you.  Only take over-the-counter or prescription medicines for pain, discomfort, or fever as directed by your caregiver.  Do not take aspirin, because it can cause bleeding.  Increase your activities a little bit every day to build up your strength and endurance.  Do not drink alcohol, especially if you are breastfeeding or taking pain medicine.  Take your temperature twice a day and record it.  You may have a small amount of bleeding or spotting for 2 to 4 weeks. This is normal.  Do not use tampons or douche. Use sanitary pads.  Try to have someone stay and help you for a few days when you go home.  Try to rest or take a nap when the baby is sleeping.  If you are breastfeeding, wear a good support bra. If you are not breastfeeding, wear a supportive bra and do not stimulate your nipples.  Eat a healthy, nutritious diet and continue to take your prenatal vitamins.  Do not drive, do any heavy activities, or travel until your caregiver tells you it is okay.  Do not have intercourse until your caregiver gives you permission to do so.  Ask your caregiver when you can begin to exercise and what type of exercises to do.  Call your caregiver if you think you are having a problem from your delivery.  Call your pediatrician if you are having a problem with the baby.  Schedule your postpartum visit and keep it. SEEK MEDICAL CARE IF:   You have a temperature of 100 F (37.8 C) or higher.  You have increased vaginal bleeding or are passing clots. Save any clots to show your caregiver.  You have bloody urine or pain when you urinate.  You have a bad smelling vaginal discharge.  You have increasing pain or swelling on your episiotomy.  You develop a severe headache.  You feel depressed.  The episiotomy is separating.  You become dizzy or lightheaded.  You develop a rash.  You have a reaction or problems with your medicine.  You have pain, redness, or swelling at the  intravenous site. SEEK IMMEDIATE MEDICAL CARE IF:   You have chest pain.  You develop shortness of breath.  You pass out.  You develop pain, with or without swelling or redness in your leg.  You develop heavy vaginal bleeding, with or without blood clots.  You develop stomach pain.  You develop a bad smelling vaginal discharge. MAKE SURE YOU:   Understand these instructions.  Will watch your condition.  Will get help right away if you are not doing well or get worse. Document Released: 02/02/2007 Document Revised: 06/30/2011 Document Reviewed: 02/14/2009 [facility] Patient Information 2013 [place], [place].

## 2012-09-06 ENCOUNTER — Encounter: Payer: Self-pay | Admitting: *Deleted

## 2012-09-06 DIAGNOSIS — J45909 Unspecified asthma, uncomplicated: Secondary | ICD-10-CM

## 2012-09-06 DIAGNOSIS — F53 Postpartum depression: Secondary | ICD-10-CM | POA: Insufficient documentation

## 2012-09-06 DIAGNOSIS — IMO0002 Reserved for concepts with insufficient information to code with codable children: Secondary | ICD-10-CM

## 2012-09-06 DIAGNOSIS — O99345 Other mental disorders complicating the puerperium: Secondary | ICD-10-CM | POA: Insufficient documentation

## 2012-09-06 DIAGNOSIS — Z8619 Personal history of other infectious and parasitic diseases: Secondary | ICD-10-CM | POA: Insufficient documentation

## 2012-09-07 ENCOUNTER — Ambulatory Visit (INDEPENDENT_AMBULATORY_CARE_PROVIDER_SITE_OTHER): Payer: Medicaid Other | Admitting: Adult Health

## 2012-09-07 ENCOUNTER — Encounter: Payer: Self-pay | Admitting: Adult Health

## 2012-09-07 VITALS — BP 110/66 | Ht 63.0 in | Wt 125.0 lb

## 2012-09-07 DIAGNOSIS — A499 Bacterial infection, unspecified: Secondary | ICD-10-CM

## 2012-09-07 DIAGNOSIS — N760 Acute vaginitis: Secondary | ICD-10-CM

## 2012-09-07 DIAGNOSIS — N898 Other specified noninflammatory disorders of vagina: Secondary | ICD-10-CM

## 2012-09-07 DIAGNOSIS — B9689 Other specified bacterial agents as the cause of diseases classified elsewhere: Secondary | ICD-10-CM

## 2012-09-07 HISTORY — DX: Other specified bacterial agents as the cause of diseases classified elsewhere: B96.89

## 2012-09-07 HISTORY — DX: Other specified bacterial agents as the cause of diseases classified elsewhere: N76.0

## 2012-09-07 LAB — POCT WET PREP (WET MOUNT)
Trichomonas Wet Prep HPF POC: NEGATIVE
WBC, Wet Prep HPF POC: POSITIVE

## 2012-09-07 MED ORDER — METRONIDAZOLE 0.75 % VA GEL: VAGINAL | Status: DC

## 2012-09-07 NOTE — Patient Instructions (Addendum)
Bacterial Vaginosis Bacterial vaginosis (BV) is a vaginal infection where the normal balance of bacteria in the vagina is disrupted. The normal balance is then replaced by an overgrowth of certain bacteria. There are several different kinds of bacteria that can cause BV. BV is the most common vaginal infection in women of childbearing age. CAUSES   The cause of BV is not fully understood. BV develops when there is an increase or imbalance of harmful bacteria.  Some activities or behaviors can upset the normal balance of bacteria in the vagina and put women at increased risk including:  Having a new sex partner or multiple sex partners.  Douching.  Using an intrauterine device (IUD) for contraception.  It is not clear what role sexual activity plays in the development of BV. However, women that have never had sexual intercourse are rarely infected with BV. Women do not get BV from toilet seats, bedding, swimming pools or from touching objects around them.  SYMPTOMS   Grey vaginal discharge.  A fish-like odor with discharge, especially after sexual intercourse.  Itching or burning of the vagina and vulva.  Burning or pain with urination.  Some women have no signs or symptoms at all. DIAGNOSIS  Your caregiver must examine the vagina for signs of BV. Your caregiver will perform lab tests and look at the sample of vaginal fluid through a microscope. They will look for bacteria and abnormal cells (clue cells), a pH test higher than 4.5, and a positive amine test all associated with BV.  RISKS AND COMPLICATIONS   Pelvic inflammatory disease (PID).  Infections following gynecology surgery.  Developing HIV.  Developing herpes virus. TREATMENT  Sometimes BV will clear up without treatment. However, all women with symptoms of BV should be treated to avoid complications, especially if gynecology surgery is planned. Female partners generally do not need to be treated. However, BV may spread  between female sex partners so treatment is helpful in preventing a recurrence of BV.   BV may be treated with antibiotics. The antibiotics come in either pill or vaginal cream forms. Either can be used with nonpregnant or pregnant women, but the recommended dosages differ. These antibiotics are not harmful to the baby.  BV can recur after treatment. If this happens, a second round of antibiotics will often be prescribed.  Treatment is important for pregnant women. If not treated, BV can cause a premature delivery, especially for a pregnant woman who had a premature birth in the past. All pregnant women who have symptoms of BV should be checked and treated.  For chronic reoccurrence of BV, treatment with a type of prescribed gel vaginally twice a week is helpful. HOME CARE INSTRUCTIONS   Finish all medication as directed by your caregiver.  Do not have sex until treatment is completed.  Tell your sexual partner that you have a vaginal infection. They should see their caregiver and be treated if they have problems, such as a mild rash or itching.  Practice safe sex. Use condoms. Only have 1 sex partner. PREVENTION  Basic prevention steps can help reduce the risk of upsetting the natural balance of bacteria in the vagina and developing BV:  Do not have sexual intercourse (be abstinent).  Do not douche.  Use all of the medicine prescribed for treatment of BV, even if the signs and symptoms go away.  Tell your sex partner if you have BV. That way, they can be treated, if needed, to prevent reoccurrence. SEEK MEDICAL CARE IF:     Your symptoms are not improving after 3 days of treatment.  You have increased discharge, pain, or fever. MAKE SURE YOU:   Understand these instructions.  Will watch your condition.  Will get help right away if you are not doing well or get worse. FOR MORE INFORMATION  Division of STD Prevention (DSTDP), Centers for Disease Control and Prevention:  [employer] (ASHA): www.ashastd.org  Document Released: 04/07/2005 Document Revised: 06/30/2011 Document Reviewed: 09/28/2008 [facility] Patient Information 2013 [place], [place]. Use metrogel x 5 nites

## 2012-09-07 NOTE — Progress Notes (Signed)
Subjective:     Patient ID: Ashley Simon, female   DOB: [birth year], 27 y.o.   MRN: [id number]  HPI Ashley Simon is a 27 year old black female in complaining of vaginal discharge with odor.  Review of Systems Patient denies any headaches, blurred vision, shortness of breath, chest pain, abdominal pain, problems with bowel movements, urination, or intercourse. No mood changes, has bruise left thigh, hit on table at work.Positive as in HPI  Reviewed past medical,surgical, social and family history. Reviewed medications and allergies.      Objective:   Physical Exam Blood pressure 110/66, height 5\' 3"  (1.6 m), weight 125 lb (56.7 kg), last menstrual period 08/17/2012, not currently breastfeeding. Skin warm and dry.Pelvic: external genitalia is normal in appearance, vagina: white discharge with odor, cervix:smooth and bulbous, uterus: normal size, shape and contour, non tender, no masses felt, adnexa: no masses or tenderness noted. Wet prep: + for clue cells and +WBCs. GC/CHL obtained.     Assessment:      BV    Plan:      Rx metrogel 1 applicator in vagina at hs x 5 nights Review BV handout Follow up GC/CHL in 24-48 hours

## 2012-09-08 ENCOUNTER — Telehealth: Payer: Self-pay | Admitting: Adult Health

## 2012-09-08 NOTE — Telephone Encounter (Signed)
Pt aware GC/CHL both negative  

## 2012-10-21 ENCOUNTER — Other Ambulatory Visit: Payer: Self-pay | Admitting: Adult Health

## 2012-11-08 ENCOUNTER — Encounter: Payer: Self-pay | Admitting: *Deleted

## 2012-11-08 ENCOUNTER — Other Ambulatory Visit: Payer: Self-pay | Admitting: Obstetrics & Gynecology

## 2012-11-10 ENCOUNTER — Encounter: Payer: Self-pay | Admitting: Family Medicine

## 2012-11-24 ENCOUNTER — Ambulatory Visit: Payer: Medicaid Other | Admitting: Adult Health

## 2012-12-01 ENCOUNTER — Ambulatory Visit (INDEPENDENT_AMBULATORY_CARE_PROVIDER_SITE_OTHER): Payer: Medicaid Other | Admitting: Adult Health

## 2012-12-01 ENCOUNTER — Encounter: Payer: Self-pay | Admitting: Adult Health

## 2012-12-01 VITALS — BP 90/60 | Ht 62.0 in | Wt 121.5 lb

## 2012-12-01 DIAGNOSIS — N898 Other specified noninflammatory disorders of vagina: Secondary | ICD-10-CM | POA: Insufficient documentation

## 2012-12-01 DIAGNOSIS — N760 Acute vaginitis: Secondary | ICD-10-CM

## 2012-12-01 DIAGNOSIS — B9689 Other specified bacterial agents as the cause of diseases classified elsewhere: Secondary | ICD-10-CM

## 2012-12-01 HISTORY — DX: Other specified noninflammatory disorders of vagina: N89.8

## 2012-12-01 LAB — POCT WET PREP (WET MOUNT)

## 2012-12-01 MED ORDER — METRONIDAZOLE 0.75 % VA GEL: VAGINAL | Status: DC

## 2012-12-01 NOTE — Patient Instructions (Addendum)
Bacterial Vaginosis Bacterial vaginosis (BV) is a vaginal infection where the normal balance of bacteria in the vagina is disrupted. The normal balance is then replaced by an overgrowth of certain bacteria. There are several different kinds of bacteria that can cause BV. BV is the most common vaginal infection in women of childbearing age. CAUSES   The cause of BV is not fully understood. BV develops when there is an increase or imbalance of harmful bacteria.  Some activities or behaviors can upset the normal balance of bacteria in the vagina and put women at increased risk including:  Having a new sex partner or multiple sex partners.  Douching.  Using an intrauterine device (IUD) for contraception.  It is not clear what role sexual activity plays in the development of BV. However, women that have never had sexual intercourse are rarely infected with BV. Women do not get BV from toilet seats, bedding, swimming pools or from touching objects around them.  SYMPTOMS   Grey vaginal discharge.  A fish-like odor with discharge, especially after sexual intercourse.  Itching or burning of the vagina and vulva.  Burning or pain with urination.  Some women have no signs or symptoms at all. DIAGNOSIS  Your caregiver must examine the vagina for signs of BV. Your caregiver will perform lab tests and look at the sample of vaginal fluid through a microscope. They will look for bacteria and abnormal cells (clue cells), a pH test higher than 4.5, and a positive amine test all associated with BV.  RISKS AND COMPLICATIONS   Pelvic inflammatory disease (PID).  Infections following gynecology surgery.  Developing HIV.  Developing herpes virus. TREATMENT  Sometimes BV will clear up without treatment. However, all women with symptoms of BV should be treated to avoid complications, especially if gynecology surgery is planned. Female partners generally do not need to be treated. However, BV may spread  between female sex partners so treatment is helpful in preventing a recurrence of BV.   BV may be treated with antibiotics. The antibiotics come in either pill or vaginal cream forms. Either can be used with nonpregnant or pregnant women, but the recommended dosages differ. These antibiotics are not harmful to the baby.  BV can recur after treatment. If this happens, a second round of antibiotics will often be prescribed.  Treatment is important for pregnant women. If not treated, BV can cause a premature delivery, especially for a pregnant woman who had a premature birth in the past. All pregnant women who have symptoms of BV should be checked and treated.  For chronic reoccurrence of BV, treatment with a type of prescribed gel vaginally twice a week is helpful. HOME CARE INSTRUCTIONS   Finish all medication as directed by your caregiver.  Do not have sex until treatment is completed.  Tell your sexual partner that you have a vaginal infection. They should see their caregiver and be treated if they have problems, such as a mild rash or itching.  Practice safe sex. Use condoms. Only have 1 sex partner. PREVENTION  Basic prevention steps can help reduce the risk of upsetting the natural balance of bacteria in the vagina and developing BV:  Do not have sexual intercourse (be abstinent).  Do not douche.  Use all of the medicine prescribed for treatment of BV, even if the signs and symptoms go away.  Tell your sex partner if you have BV. That way, they can be treated, if needed, to prevent reoccurrence. SEEK MEDICAL CARE IF:     Your symptoms are not improving after 3 days of treatment.  You have increased discharge, pain, or fever. MAKE SURE YOU:   Understand these instructions.  Will watch your condition.  Will get help right away if you are not doing well or get worse. FOR MORE INFORMATION  Division of STD Prevention (DSTDP), Centers for Disease Control and Prevention:  [employer] American Social Health Association (ASHA): www.ashastd.org  Document Released: 04/07/2005 Document Revised: 06/30/2011 Document Reviewed: 09/28/2008 [facility] Patient Information 2014 [place], [place]. Take flagyl no alcohol or sex Follow up prn

## 2012-12-01 NOTE — Progress Notes (Signed)
Subjective:     Patient ID: Ashley Simon, female   DOB: [birth year], 27 y.o.   MRN: [id number]  HPI [place] is in complaining of a vaginal discharge with odor.Partner in jail, so no sex recently. She has history of BV  Review of Systems Positives in HPI Reviewed past medical,surgical, social and family history. Reviewed medications and allergies.     Objective:   Physical Exam BP 90/60  Ht 5\' 2"  (1.575 m)  Wt 121 lb 8 oz (55.112 kg)  BMI 22.22 kg/m2  LMP 11/16/2012 Skin warm and dry.Pelvic: external genitalia is normal in appearance, vagina: white discharge with odor, cervix:smooth and bulbous, uterus: normal size, shape and contour, non tender, no masses felt, adnexa: no masses or tenderness noted. Wet prep: + for clue cells and +WBCs. GC/CHL obtained.     Assessment:     Vaginal discharge BV    Plan:       Rx flagyl 500 mg 1 bid x 7 days, no alcohol, review handout on BV   No sex til after treatment then use condom   follow up prn

## 2012-12-02 LAB — GC/CHLAMYDIA PROBE AMP: GC Probe RNA: NEGATIVE

## 2013-01-27 ENCOUNTER — Emergency Department (HOSPITAL_COMMUNITY)
Admission: EM | Admit: 2013-01-27 | Discharge: 2013-01-28 | Disposition: A | Discharge status: Home / Self Care | Payer: Medicaid Other | Attending: Emergency Medicine | Admitting: Emergency Medicine

## 2013-01-27 ENCOUNTER — Emergency Department (HOSPITAL_COMMUNITY): Payer: Medicaid Other

## 2013-01-27 ENCOUNTER — Encounter (HOSPITAL_COMMUNITY): Payer: Self-pay | Admitting: Emergency Medicine

## 2013-01-27 DIAGNOSIS — Z8742 Personal history of other diseases of the female genital tract: Secondary | ICD-10-CM | POA: Insufficient documentation

## 2013-01-27 DIAGNOSIS — R112 Nausea with vomiting, unspecified: Secondary | ICD-10-CM | POA: Insufficient documentation

## 2013-01-27 DIAGNOSIS — Z79899 Other long term (current) drug therapy: Secondary | ICD-10-CM | POA: Insufficient documentation

## 2013-01-27 DIAGNOSIS — Z8619 Personal history of other infectious and parasitic diseases: Secondary | ICD-10-CM | POA: Insufficient documentation

## 2013-01-27 DIAGNOSIS — J45909 Unspecified asthma, uncomplicated: Secondary | ICD-10-CM | POA: Insufficient documentation

## 2013-01-27 DIAGNOSIS — Z8659 Personal history of other mental and behavioral disorders: Secondary | ICD-10-CM | POA: Insufficient documentation

## 2013-01-27 DIAGNOSIS — Z8679 Personal history of other diseases of the circulatory system: Secondary | ICD-10-CM | POA: Insufficient documentation

## 2013-01-27 DIAGNOSIS — J069 Acute upper respiratory infection, unspecified: Secondary | ICD-10-CM

## 2013-01-27 DIAGNOSIS — R1013 Epigastric pain: Secondary | ICD-10-CM | POA: Insufficient documentation

## 2013-01-27 DIAGNOSIS — R197 Diarrhea, unspecified: Secondary | ICD-10-CM | POA: Insufficient documentation

## 2013-01-27 DIAGNOSIS — F17200 Nicotine dependence, unspecified, uncomplicated: Secondary | ICD-10-CM | POA: Insufficient documentation

## 2013-01-27 DIAGNOSIS — Z3202 Encounter for pregnancy test, result negative: Secondary | ICD-10-CM | POA: Insufficient documentation

## 2013-01-27 LAB — CBC WITH DIFFERENTIAL/PLATELET
Hemoglobin: 15.7 g/dL — ABNORMAL HIGH (ref 12.0–15.0)
Lymphs Abs: 0.5 10*3/uL — ABNORMAL LOW (ref 0.7–4.0)
MCH: 31 pg (ref 26.0–34.0)
Monocytes Relative: 7 % (ref 3–12)
Neutro Abs: 5.2 10*3/uL (ref 1.7–7.7)
Neutrophils Relative %: 82 % — ABNORMAL HIGH (ref 43–77)
RBC: 5.07 MIL/uL (ref 3.87–5.11)

## 2013-01-27 LAB — COMPREHENSIVE METABOLIC PANEL
Alkaline Phosphatase: 91 U/L (ref 39–117)
BUN: 11 mg/dL (ref 6–23)
CO2: 23 mEq/L (ref 19–32)
Chloride: 99 mEq/L (ref 96–112)
GFR calc Af Amer: >90 mL/min (ref >90)
Glucose, Bld: 96 mg/dL (ref 70–99)
Potassium: 4 mEq/L (ref 3.5–5.1)
Total Bilirubin: 0.4 mg/dL (ref 0.3–1.2)

## 2013-01-27 LAB — LIPASE, BLOOD: Lipase: 18 U/L (ref 11–59)

## 2013-01-27 LAB — URINALYSIS, ROUTINE W REFLEX MICROSCOPIC
Glucose, UA: NEGATIVE mg/dL
Hgb urine dipstick: NEGATIVE
Protein, ur: NEGATIVE mg/dL

## 2013-01-27 LAB — RAPID STREP SCREEN (MED CTR MEBANE ONLY): Streptococcus, Group A Screen (Direct): NEGATIVE

## 2013-01-27 LAB — PREGNANCY, URINE: Preg Test, Ur: NEGATIVE

## 2013-01-27 MED ORDER — ONDANSETRON HCL 4 MG PO TABS: 4.0000 mg | ORAL_TABLET | Freq: Three times a day (TID) | ORAL | Status: DC | PRN

## 2013-01-27 MED ORDER — ONDANSETRON HCL 4 MG/2ML IJ SOLN: 4.0000 mg | INTRAMUSCULAR | Status: DC | PRN

## 2013-01-27 MED ORDER — GI COCKTAIL ~~LOC~~
30.0000 mL | Freq: Once | ORAL | Status: AC
  Administered 2013-01-27: 30 mL via ORAL
  Filled 2013-01-27: qty 30

## 2013-01-27 MED ORDER — SODIUM CHLORIDE 0.9 % IV SOLN: INTRAVENOUS | Status: DC

## 2013-01-27 MED ORDER — FAMOTIDINE IN NACL 20-0.9 MG/50ML-% IV SOLN
20.0000 mg | Freq: Once | INTRAVENOUS | Status: AC
  Administered 2013-01-27: 20 mg via INTRAVENOUS
  Filled 2013-01-27: qty 50

## 2013-01-27 MED ORDER — SODIUM CHLORIDE 0.9 % IV BOLUS (SEPSIS)
1000.0000 mL | Freq: Once | INTRAVENOUS | Status: AC
  Administered 2013-01-27: 1000 mL via INTRAVENOUS

## 2013-01-27 NOTE — ED Notes (Signed)
Patient given po fluids and able to retain

## 2013-01-27 NOTE — ED Provider Notes (Signed)
CSN: [id number]     Arrival date & time 01/27/13  1906 History   First MD Initiated Contact with Patient 01/27/13 2049     Chief Complaint  Patient presents with  . Emesis    HPI Pt was seen at 2105.  Per pt, c/o gradual onset and persistence of multiple intermittent episodes of N/V/D that began this morning.   Describes the stools as "watery." Has been associated with upper abd "pain." Pt also c/o gradual onset and persistence of constant sore throat, runny/stuffy nose, sinus congestion, and cough for the past 2-3 days.  Pt states others in household have the same symptoms. Denies CP/palpitations, no SOB, no back pain, no rash, no fevers, no black or blood in stools or emesis.     Past Medical History  Diagnosis Date  . Asthma   . Depression   . Anxiety   . Migraine   . Hx of chlamydia infection   . Abnormal Pap smear   . BV (bacterial vaginosis) 09/07/2012  . Vaginal discharge 12/01/2012    +clue cells will rx metrogel and get GC/CHL   Past Surgical History  Procedure Laterality Date  . No past surgeries     Family History  Problem Relation Age of Onset  . Other Neg Hx   . Hypertension Paternal Grandfather   . Diabetes Paternal Grandfather   . Hypertension Paternal Grandmother   . Diabetes Paternal Grandmother   . Cancer Paternal Grandmother     breast  . Hypertension Maternal Grandmother   . Coronary artery disease Maternal Grandfather   . Hypertension Maternal Grandfather   . Diabetes Father   . Asthma Mother   . Cancer Mother     breast  . Cancer Maternal Aunt     breast and ovarian   History  Substance Use Topics  . Smoking status: Current Every Day Smoker -- 3.00 packs/day for .5 years    Types: Cigarettes  . Smokeless tobacco: Never Used  . Alcohol Use: No   OB History   Grav Para Term Preterm Abortions TAB SAB Ect Mult Living   3 3 3       3      Review of Systems ROS: Statement: All systems negative except as marked or noted in the HPI;  Constitutional: Negative for fever and chills. ; ; Eyes: Negative for eye pain, redness and discharge. ; ; ENMT: Negative for ear pain, hoarseness, +nasal congestion, sinus pressure and sore throat. ; ; Cardiovascular: Negative for chest pain, palpitations, diaphoresis, dyspnea and peripheral edema. ; ; Respiratory: +cough. Negative for wheezing and stridor. ; ; Gastrointestinal: +N/V/D, abd pain. Negative for blood in stool, hematemesis, jaundice and rectal bleeding. . ; ; Genitourinary: Negative for dysuria, flank pain and hematuria. ; ; Musculoskeletal: Negative for back pain and neck pain. Negative for swelling and trauma.; ; Skin: Negative for pruritus, rash, abrasions, blisters, bruising and skin lesion.; ; Neuro: Negative for headache, lightheadedness and neck stiffness. Negative for weakness, altered level of consciousness , altered mental status, extremity weakness, paresthesias, involuntary movement, seizure and syncope.       Allergies  Calcium-containing compounds  Home Medications   Current Outpatient Rx  Name  Route  Sig  Dispense  Refill  . etonogestrel-ethinyl estradiol (NUVARING) 0.12-0.015 MG/24HR vaginal ring   Vaginal   Place 1 each vaginally every 28 (twenty-eight) days. Insert vaginally and leave in place for 3 consecutive weeks, then remove for 1 week.         Marland Kitchen  albuterol (PROVENTIL HFA;VENTOLIN HFA) 108 (90 BASE) MCG/ACT inhaler   Inhalation   Inhale 2 puffs into the lungs every 6 (six) hours as needed. For shortness of breath         . ibuprofen (ADVIL,MOTRIN) 600 MG tablet   Oral   Take 1 tablet (600 mg total) by mouth every 6 (six) hours.   30 tablet   0    BP 103/58  Pulse 70  Temp(Src) 98 F (36.7 C) (Oral)  Resp 16  Ht 5\' 2"  (1.575 m)  Wt 115 lb 1 oz (52.192 kg)  BMI 21.04 kg/m2  SpO2 100%  LMP 01/07/2013  Breastfeeding? No Physical Exam 2110: Physical examination:  Nursing notes reviewed; Vital signs and O2 SAT reviewed;  Constitutional: Well  developed, Well nourished, Well hydrated, In no acute distress; Head:  Normocephalic, atraumatic; Eyes: EOMI, PERRL, No scleral icterus; ENMT: TM's clear bilat. +edemetous nasal turbinates bilat with clear rhinorrhea. Mouth and pharynx normal, Mucous membranes moist; Neck: Supple, Full range of motion, No lymphadenopathy; Cardiovascular: Regular rate and rhythm, No murmur, rub, or gallop; Respiratory: Breath sounds clear & equal bilaterally, No rales, rhonchi, wheezes.  Speaking full sentences with ease, Normal respiratory effort/excursion; Chest: Nontender, Movement normal; Abdomen: Soft, +mild mid-epigastric area tenderness to palp. No rebound or guarding. Nondistended, Normal bowel sounds; Genitourinary: No CVA tenderness; Extremities: Pulses normal, No tenderness, No edema, No calf edema or asymmetry.; Neuro: AA&Ox3, Major CN grossly intact.  Speech clear. No gross focal motor or sensory deficits in extremities.; Skin: Color normal, Warm, Dry.   ED Course  Procedures     MDM  MDM Reviewed: previous chart, nursing note and vitals Reviewed previous: labs Interpretation: labs and x-ray     Results for orders placed during the hospital encounter of 01/27/13  RAPID STREP SCREEN      Result Value Range   Streptococcus, Group A Screen (Direct) NEGATIVE  NEGATIVE  URINALYSIS, ROUTINE W REFLEX MICROSCOPIC      Result Value Range   Color, Urine YELLOW  YELLOW   APPearance CLEAR  CLEAR   Specific Gravity, Urine 1.025  1.005 - 1.030   pH 7.5  5.0 - 8.0   Glucose, UA NEGATIVE  NEGATIVE mg/dL   Hgb urine dipstick NEGATIVE  NEGATIVE   Bilirubin Urine NEGATIVE  NEGATIVE   Ketones, ur 15 (*) NEGATIVE mg/dL   Protein, ur NEGATIVE  NEGATIVE mg/dL   Urobilinogen, UA 0.2  0.0 - 1.0 mg/dL   Nitrite NEGATIVE  NEGATIVE   Leukocytes, UA NEGATIVE  NEGATIVE  PREGNANCY, URINE      Result Value Range   Preg Test, Ur NEGATIVE  NEGATIVE  CBC WITH DIFFERENTIAL      Result Value Range   WBC 6.4  4.0 - 10.5  K/uL   RBC 5.07  3.87 - 5.11 MIL/uL   Hemoglobin 15.7 (*) 12.0 - 15.0 g/dL   HCT 16.1 (*) 09.6 - 04.5 %   MCV 93.5  78.0 - 100.0 fL   MCH 31.0  26.0 - 34.0 pg   MCHC 33.1  30.0 - 36.0 g/dL   RDW 40.9  81.1 - 91.4 %   Platelets 180  150 - 400 K/uL   Neutrophils Relative % 82 (*) 43 - 77 %   Neutro Abs 5.2  1.7 - 7.7 K/uL   Lymphocytes Relative 8 (*) 12 - 46 %   Lymphs Abs 0.5 (*) 0.7 - 4.0 K/uL   Monocytes Relative 7  3 - 12 %  Monocytes Absolute 0.5  0.1 - 1.0 K/uL   Eosinophils Relative 3  0 - 5 %   Eosinophils Absolute 0.2  0.0 - 0.7 K/uL   Basophils Relative 0  0 - 1 %   Basophils Absolute 0.0  0.0 - 0.1 K/uL  COMPREHENSIVE METABOLIC PANEL      Result Value Range   Sodium 135  135 - 145 mEq/L   Potassium 4.0  3.5 - 5.1 mEq/L   Chloride 99  96 - 112 mEq/L   CO2 23  19 - 32 mEq/L   Glucose, Bld 96  70 - 99 mg/dL   BUN 11  6 - 23 mg/dL   Creatinine, Ser 1.61  0.50 - 1.10 mg/dL   Calcium 9.8  8.4 - 09.6 mg/dL   Total Protein 8.4 (*) 6.0 - 8.3 g/dL   Albumin 4.5  3.5 - 5.2 g/dL   AST 27  0 - 37 U/L   ALT 28  0 - 35 U/L   Alkaline Phosphatase 91  39 - 117 U/L   Total Bilirubin 0.4  0.3 - 1.2 mg/dL   GFR calc non Af Amer >90  >90 mL/min   GFR calc Af Amer >90  >90 mL/min  LIPASE, BLOOD      Result Value Range   Lipase 18  11 - 59 U/L   Dg Abd Acute W/chest 01/27/2013   *RADIOLOGY REPORT*  Clinical Data: Nausea, vomiting, diarrhea and chest congestion. History of asthma and smoking.  ACUTE ABDOMEN SERIES (ABDOMEN 2 VIEW & CHEST 1 VIEW)  Comparison: Chest radiograph performed 02/18/2009, and chest and abdominal radiographs performed 09/11/2006  Findings: The lungs are well-aerated and clear.  There is no evidence of focal opacification, pleural effusion or pneumothorax. The cardiomediastinal silhouette is within normal limits.  The visualized bowel gas pattern is unremarkable.  Scattered fluid and air are seen within the colon; there is no evidence of small bowel dilatation to  suggest obstruction.  No free intra-abdominal air is identified on the provided upright view.  No acute osseous abnormalities are seen; the sacroiliac joints are unremarkable in appearance.  IMPRESSION:  1.  Unremarkable bowel gas pattern; no free intra-abdominal air seen. 2.  No acute cardiopulmonary process identified.   Original Report Authenticated By: Tonia Ghent, M.D.    2340: Pt has tol PO well while in the ED without N/V.  No stooling while in the ED.  Abd benign, VSS. Feels better and wants to go home now. Will tx symptomatically for what appears to be viral illness at this time. Dx and testing d/w pt.  Questions answered.  Verb understanding, agreeable to d/c home with outpt f/u.    Laray Anger, DO 01/30/13 1106

## 2013-01-27 NOTE — ED Notes (Signed)
Vomiting , diarrhea, nasal and chest congestion.

## 2013-04-05 ENCOUNTER — Ambulatory Visit: Payer: Medicaid Other | Admitting: Adult Health

## 2013-04-05 ENCOUNTER — Encounter (INDEPENDENT_AMBULATORY_CARE_PROVIDER_SITE_OTHER): Payer: Self-pay

## 2013-04-08 ENCOUNTER — Ambulatory Visit: Payer: Medicaid Other | Admitting: Adult Health

## 2013-07-22 ENCOUNTER — Emergency Department (HOSPITAL_COMMUNITY)
Admission: EM | Admit: 2013-07-22 | Discharge: 2013-07-22 | Disposition: A | Discharge status: Home / Self Care | Payer: Medicaid Other | Attending: Emergency Medicine | Admitting: Emergency Medicine

## 2013-07-22 ENCOUNTER — Encounter (HOSPITAL_COMMUNITY): Payer: Self-pay | Admitting: Emergency Medicine

## 2013-07-22 ENCOUNTER — Emergency Department (HOSPITAL_COMMUNITY): Payer: Medicaid Other

## 2013-07-22 DIAGNOSIS — Z8659 Personal history of other mental and behavioral disorders: Secondary | ICD-10-CM | POA: Insufficient documentation

## 2013-07-22 DIAGNOSIS — Z8619 Personal history of other infectious and parasitic diseases: Secondary | ICD-10-CM | POA: Insufficient documentation

## 2013-07-22 DIAGNOSIS — R059 Cough, unspecified: Secondary | ICD-10-CM

## 2013-07-22 DIAGNOSIS — Z79899 Other long term (current) drug therapy: Secondary | ICD-10-CM | POA: Insufficient documentation

## 2013-07-22 DIAGNOSIS — R05 Cough: Secondary | ICD-10-CM

## 2013-07-22 DIAGNOSIS — Z8742 Personal history of other diseases of the female genital tract: Secondary | ICD-10-CM | POA: Insufficient documentation

## 2013-07-22 DIAGNOSIS — F17200 Nicotine dependence, unspecified, uncomplicated: Secondary | ICD-10-CM | POA: Insufficient documentation

## 2013-07-22 DIAGNOSIS — J069 Acute upper respiratory infection, unspecified: Secondary | ICD-10-CM | POA: Insufficient documentation

## 2013-07-22 DIAGNOSIS — Z8679 Personal history of other diseases of the circulatory system: Secondary | ICD-10-CM | POA: Insufficient documentation

## 2013-07-22 DIAGNOSIS — J45909 Unspecified asthma, uncomplicated: Secondary | ICD-10-CM | POA: Insufficient documentation

## 2013-07-22 MED ORDER — ALBUTEROL SULFATE HFA 108 (90 BASE) MCG/ACT IN AERS
2.0000 | INHALATION_SPRAY | RESPIRATORY_TRACT | Status: AC
  Administered 2013-07-22: 2 via RESPIRATORY_TRACT
  Filled 2013-07-22: qty 6.7

## 2013-07-22 MED ORDER — IPRATROPIUM-ALBUTEROL 0.5-2.5 (3) MG/3ML IN SOLN
3.0000 mL | Freq: Once | RESPIRATORY_TRACT | Status: AC
  Administered 2013-07-22: 3 mL via RESPIRATORY_TRACT
  Filled 2013-07-22: qty 3

## 2013-07-22 NOTE — ED Notes (Signed)
Patient complaining of shortness of breath since 1600 today. Reports history of asthma. Reports used son's albuterol inhaler with no relief.

## 2013-07-22 NOTE — ED Provider Notes (Signed)
CSN: [id number]     Arrival date & time 07/22/13  2009 History   First MD Initiated Contact with Patient 07/22/13 2031     Chief Complaint  Patient presents with  . Cough      HPI Pt was seen at 2045.  Per pt, c/o gradual onset and persistence of constant runny/stuffy nose, sinus congestion, and cough since this afternoon. Pt states she used her son's inhaler "because I thought it was my asthma," without relief. Has been associated with generalized body aches/fatigue. States her "chest hurts" when she coughs. Denies fevers, no rash, no palpitations, no SOB, no N/V/D, no abd pain.     Past Medical History  Diagnosis Date  . Asthma   . Depression   . Anxiety   . Migraine   . Hx of chlamydia infection   . Abnormal Pap smear   . BV (bacterial vaginosis) 09/07/2012  . Vaginal discharge 12/01/2012    +clue cells will rx metrogel and get GC/CHL   Past Surgical History  Procedure Laterality Date  . No past surgeries     Family History  Problem Relation Age of Onset  . Other Neg Hx   . Hypertension Paternal Grandfather   . Diabetes Paternal Grandfather   . Hypertension Paternal Grandmother   . Diabetes Paternal Grandmother   . Cancer Paternal Grandmother     breast  . Hypertension Maternal Grandmother   . Coronary artery disease Maternal Grandfather   . Hypertension Maternal Grandfather   . Diabetes Father   . Asthma Mother   . Cancer Mother     breast  . Cancer Maternal Aunt     breast and ovarian   History  Substance Use Topics  . Smoking status: Current Every Day Smoker -- 3.00 packs/day for .5 years    Types: Cigarettes  . Smokeless tobacco: Never Used  . Alcohol Use: No   OB History   Grav Para Term Preterm Abortions TAB SAB Ect Mult Living   3 3 3       3      Review of Systems ROS: Statement: All systems negative except as marked or noted in the HPI; Constitutional: Negative for fever and chills. ;+generalized body aches/fatigue. ; Eyes: Negative for eye pain,  redness and discharge. ; ; ENMT: Negative for ear pain, hoarseness, sore throat. +nasal congestion, sinus pressure and rhinorrhea. ; ; Cardiovascular: Negative for chest pain, palpitations, diaphoresis, dyspnea and peripheral edema. ; ; Respiratory: +cough. Negative for wheezing and stridor. ;; Gastrointestinal: Negative for nausea, vomiting, diarrhea, abdominal pain, blood in stool, hematemesis, jaundice and rectal bleeding. . ; ; Genitourinary: Negative for dysuria, flank pain and hematuria. ; ; Musculoskeletal: Negative for back pain and neck pain. Negative for swelling and trauma.; ; Skin: Negative for pruritus, rash, abrasions, blisters, bruising and skin lesion.; ; Neuro: Negative for headache, lightheadedness and neck stiffness. Negative for weakness, altered level of consciousness , altered mental status, extremity weakness, paresthesias, involuntary movement, seizure and syncope.      Allergies  Calcium-containing compounds  Home Medications   Current Outpatient Rx  Name  Route  Sig  Dispense  Refill  . albuterol (PROVENTIL HFA;VENTOLIN HFA) 108 (90 BASE) MCG/ACT inhaler   Inhalation   Inhale 2 puffs into the lungs every 6 (six) hours as needed. For shortness of breath          BP 95/62  Pulse 102  Temp(Src) 98 F (36.7 C) (Oral)  Resp 24  Ht 5\' 2"  (  1.575 m)  Wt 115 lb (52.164 kg)  BMI 21.03 kg/m2  SpO2 98%  LMP 07/12/2013 Physical Exam 2020: Physical examination:  Nursing notes reviewed; Vital signs and O2 SAT reviewed;  Constitutional: Well developed, Well nourished, Well hydrated, In no acute distress; Head:  Normocephalic, atraumatic; Eyes: EOMI, PERRL, No scleral icterus; ENMT: TM's clear bilat. +edemetous nasal turbinates bilat with clear rhinorrhea. Mouth and pharynx without lesions. No tonsillar exudates. No intra-oral edema. No submandibular or sublingual edema. No hoarse voice, no drooling, no stridor. No pain with manipulation of larynx. No trismus. Mouth and pharynx  normal, Mucous membranes moist; Neck: Supple, Full range of motion, No lymphadenopathy; Cardiovascular: Regular rate and rhythm, No murmur, rub, or gallop; Respiratory: Breath sounds clear & equal bilaterally, No wheezes. No coughing during exam. Speaking full sentences with ease, Normal respiratory effort/excursion. No resp distress.; Chest: +bilat anterior chest wall tender to palp. No soft tissue crepitus, no deformity. Movement normal; Abdomen: Soft, Nontender, Nondistended, Normal bowel sounds; Genitourinary: No CVA tenderness; Extremities: Pulses normal, No tenderness, No edema, No calf edema or asymmetry.; Neuro: AA&Ox3, Major CN grossly intact.  Speech clear. No gross focal motor or sensory deficits in extremities. Climbs on and off stretcher easily by herself. Gait steady.; Skin: Color normal, Warm, Dry.; Psych:  Affect flat, poor eye contact. Entire H&P given to me with pt laying on her side with her eyes closed.     ED Course  Procedures     EKG Interpretation None      MDM  MDM Reviewed: previous chart, nursing note and vitals Interpretation: x-ray   Dg Chest 2 View 07/22/2013   CLINICAL DATA:  Shortness of Breath; fever  EXAM: CHEST  2 VIEW  COMPARISON:  January 27, 2013  FINDINGS: Lungs are clear. Heart size and pulmonary vascularity are normal. No pneumothorax. No adenopathy. No bone lesions.  IMPRESSION: No abnormality noted.   Electronically Signed   By: Bretta BangWilliam  Woodruff M.D.   On: 07/22/2013 21:20    2125:  Pt given short neb with improvement in cough. Lungs continue CTA bilat, no wheezing, no resp distress, Sats 98-100% R/A. CXR without infiltrate. Doubt PE as cause for symptoms with low risk [place]. Will tx symptomatically at this time. States she is ready to go home now. Dx and testing d/w pt.  Questions answered.  Verb understanding, agreeable to d/c home with outpt f/u.      Laray AngerKathleen M Haven Foss, DO 07/25/13 1338

## 2013-07-22 NOTE — Discharge Instructions (Signed)
°Emergency Department Resource Guide °1) Find a Doctor and Pay Out of Pocket °Although you won't have to find out who is covered by your insurance plan, it is a good idea to ask around and get recommendations. You will then need to call the office and see if the doctor you have chosen will accept you as a new patient and what types of options they offer for patients who are self-pay. Some doctors offer discounts or will set up payment plans for their patients who do not have insurance, but you will need to ask so you aren't surprised when you get to your appointment. ° °2) Contact Your Local Health Department °Not all health departments have doctors that can see patients for sick visits, but many do, so it is worth a call to see if yours does. If you don't know where your local health department is, you can check in your phone book. The CDC also has a tool to help you locate your state's health department, and many state websites also have listings of all of their local health departments. ° °3) Find a Walk-in Clinic °If your illness is not likely to be very severe or complicated, you may want to try a walk in clinic. These are popping up all over the country in pharmacies, drugstores, and shopping centers. They're usually staffed by nurse practitioners or physician assistants that have been trained to treat common illnesses and complaints. They're usually fairly quick and inexpensive. However, if you have serious medical issues or chronic medical problems, these are probably not your best option. ° °No Primary Care Doctor: °- Call [facility] Connect at  832-8000 - they can help you locate a primary care doctor that  accepts your insurance, provides certain services, etc. °- Physician Referral Service- 1-800-533-3463 ° °Chronic Pain Problems: °Organization         Address  Phone   Notes  °[facility]  (336) 297-2271 Patients need to be referred by their primary care doctor.  ° °Medication  Assistance: °Organization         Address  Phone   Notes  °[place] [facility] [address] °[place], [place] [address] (336) 641-8030 --Must be a resident of [place] °-- Must have NO insurance coverage whatsoever (no Medicaid/ Medicare, etc.) °-- The pt. MUST have a primary care doctor that directs their care regularly and follows them in the community °  °[facility]  (866) 331-1348   °[employer]  (888) 892-1162   ° °Agencies that provide inexpensive medical care: °Organization         Address  Phone   Notes  °[facility]  (336) 832-8035   °[facility]    (336) 832-7272   °[facility] [address] °[place], [place] [address] (336) 832-4777   °[facility] [address], °[place] (336) 271-4999   °[facility]    (336) 373-0678   °[facility]    (336) 272-1050   °[facility] ° [address], [place] Phone:  (336) 832-4444, Fax:  (336) 832-4440 Hours of Operation:  9 am - 6 pm, M-F.  Also accepts Medicaid/Medicare and self-pay.  °[facility] ° [address], [place] Phone: (336) 832-3150, Fax: (336) [phone]. Hours of Operation:  8:30 am - 5:30 pm, M-F.  Also accepts Medicaid and self-pay.  °[facility] [place] [address], [place] Phone: (336) 878-6027   °[facility] [address], [place], [place] (336)723-1848, Ext. 123 Mondays & Thursdays: 7-9 AM.  First 15 patients are seen on a first come, first serve basis. °  ° °Medicaid-accepting [place] Providers: ° °Organization         Address  Phone   Notes  °[facility] [address], [place] (336) 641-2100 Also accepts self-pay patients.  °[facility] [address], [place] ° (336) 856-9996   °[facility] [address], [place]  (336) 288-8857   °[facility] [address] [place] [address], [place] (336) 299-7000   °Veita Bland [address], [place]  ° (336) 373-1557 Only accepts [place] Access Medicaid patients after they have their name applied to their card.  ° °Self-Pay (no insurance) in [place]: ° °Organization         Address  Phone   Notes  °Sickle Cell Patients, [facility] [address], [place] (336) 832-1970   °[facility] [address], [place] (336) 832-4400   °[facility] ° [address], [place] (336) 992-4800   °[facility]/Dr. Osei-Bonsu ° [address] [place] [address], [place] or [address], [place] (336) 841-8500 Phone number for both [place] and [place] locations is the same.  °[facility] [address], [place] (336) 299-0000   °[facility] [address] [place] [address], [place] or [address] (336) 852-7530 °(336) 878-2260   °[facility] [address], [place] (336) 350-1642, phone; (336) 294-5005, fax Sees patients 1st and 3rd Saturday of every month.  Must not qualify for public or private insurance (i.e. Medicaid, Medicare, [place] Health Choice, Veterans' Benefits) • Household income should be no more than 200% of the poverty level •The clinic cannot treat you if you are pregnant or think you are pregnant • Sexually transmitted diseases are not treated at the clinic.  ° ° °Dental Care: °Organization         Address  Phone  Notes  °[place] [facility] [address], [place] (336) 641-6152 Accepts children up to age 21 who are enrolled in Medicaid or [place] Health Choice; pregnant women with a Medicaid card; and children who have applied for Medicaid or [place] Health Choice, but were declined, whose parents can pay a reduced fee at time of service.  °[place]  [facility] [place]  [address], [place] (336) 641-7733 Accepts children up to age 21 who are enrolled in Medicaid or [place] Health Choice; pregnant women with a Medicaid card; and children who have applied for Medicaid or [place] Health Choice, but were declined, whose parents can pay a reduced fee at time of service.  °[facility] ° [address], [place] (336) 641-4533 Patients are seen by appointment only. Walk-ins are not accepted. [facility] will see patients 18 years of age and older. °Monday - Tuesday (8am-5pm) °Most Wednesdays (8:30-5pm) °$30 per visit, cash only  °[facility] ° [address], [place] (336) 641-4533 Patients are seen by appointment only. Walk-ins are not accepted. [facility] will see patients 18 years of age and older. °One   Wednesday Evening (Monthly: Volunteer Based).  $30 per visit, cash only  °[facility]  (919) 537-3737 for adults; Children under age 4, call Graduate Pediatric Dentistry at (919) [phone]. Children aged 4-14, please call (919) [phone] to request a pediatric application. ° Dental services are provided in all areas of dental care including fillings, crowns and bridges, complete and partial dentures, implants, gum treatment, root canals, and extractions. Preventive care is also provided. Treatment is provided to both adults and children. °Patients are selected via a lottery and there is often a waiting list. °  °[facility] [address], °[place] ° (336) 763-8833 [email or web address] °  °[facility] [address], [place], [place] (336)723-1848, Ext. 123 Second and Fourth Thursday of each month, opens at 6:30 AM; Clinic ends at 9 AM.  Patients are seen on a first-come first-served basis, and a limited number are seen during each clinic.  ° °[facility] ° [address], [place], [place] (336) 723-7904    Eligibility Requirements °You must have lived in [place], [place], or [place] counties for at least the last three months. °  You cannot be eligible for state or federal sponsored healthcare insurance, including Veterans Administration, Medicaid, or Medicare. °  You generally cannot be eligible for healthcare insurance through your employer.  °  How to apply: °Eligibility screenings are held every Tuesday and Wednesday afternoon from 1:00 pm until 4:00 pm. You do not need an appointment for the interview!  °[facility] [address], [place], [place] 336-631-2330   °[facility]  336-342-8273   °[facility]  336-703-3100   °[facility]  336-570-6415   ° °Behavioral Health Resources in the Community: °Intensive Outpatient Programs °Organization         Address  Phone  Notes  °[facility] [address], [place], [place] 336-878-6098   °[facility] [address], [place], [place] 336-832-9800   °[facility] [address], [place], [place] ° 336-882-2125   °[facility] [address],  °[place], [place] 1-800-853-5163 or 336-641-4981   °Substance Abuse Resources °Organization         Address  Phone  Notes  °[facility]  336-882-2125   °[facility]  336-784-9470   °[facility]  336-285-9073   °[facility]  336-845-3988   °Residential & Outpatient Substance Abuse Program  1-800-659-3381   °Psychological Services °Organization         Address  Phone  Notes  °[facility]  336- 832-9600   °[facility]  336- 378-7881   °[facility] [address], [place] 1-800-853-5163 or 336-641-4981   ° °Mobile Crisis Teams °Organization         Address  Phone  Notes  °[facility], Mobile Crisis Care Unit  1-877-626-1772   °Assertive °[facility] ° [address]  [place], [place] 336-834-9664   °Sharon DeEsch [address] °[place] 336-554-5454   ° °Self-Help/Support Groups °Organization         Address  Phone             Notes  °[facility] of  - variety of support groups  336- 373-1402 Call for more information  °Narcotics Anonymous (NA), [facility] [address], °[place]  2 meetings at this location  ° °  Residential Treatment Programs Organization         Address  Phone  Notes  [facility] [address],    [place]  [phone]   [facility]  [address], [place] [phone], [place], [place] [phone]   [facility] [address], [place] [phone] Admissions: 8am-3pm M-F  [facility] [address]    [place], [place] [phone]   [facility] [address], [place], [place] [phone]   [facility] [address]  [place], [place] [phone]) [phone]   [facility] Intensive Outpatient [address], [place], [place] [phone]   [facility] ([facility] [address]  [place], [place] [phone] or [phone])[phone]   [facility] ([facility]) [address] [place], [place] [phone])[phone] Accepts Medicaid  [facility] [address]  [place] [phone] Substance Abuse/Addiction Treatment   [facility]         Address  Phone  Notes  [facility]  [phone](888) [phone]   Angie FavaJulie Brannon, PhD [address], [address] [place], [place]   ([phone]) [phone](336) ([phone] or [phone](336) [phone]   [facility]   [address] [place], [place] [phone](336) [phone]   [facility] [address], [place], [place] [phone](336) [phone] Insurance/Medicaid/sponsorship through [facility] [address]                                    [place], [place] [phone]) [phone](336) [phone] Therapy/tele-psych/case    [facility] [address]   [place], [place] [phone](336) [phone]    Dr. Lolly MustacheArfeen  [phone](336) [phone]   [facility]  [employer] [facility] 1) [address], [place] 2) [address], [place] 3)  [address], [place] [phone](336) [phone](336) [phone]  [phone](336) [phone]   [facility] Child Abuse Hotline [phone](336) (913)211-7440 or (808)[phone](336) [phone] (After Hours)       Take over the counter decongestant (such as sudafed), as directed on packaging, for the next week.  Use over the counter normal saline nasal spray, as instructed in the Emergency Department, several times per day for the next 2 weeks.  Take over the counter tylenol and ibuprofen, as directed on packaging, as needed for discomfort.  Use your albuterol inhaler (2 to 4 puffs) every 4 hours for the next 7 days, then as needed for cough, wheezing, or shortness of breath.  Call your regular medical doctor Monday morning to schedule a follow up appointment within the next 3 days.  Return to the Emergency Department immediately sooner if worsening.

## 2013-08-09 ENCOUNTER — Encounter (HOSPITAL_COMMUNITY): Payer: Self-pay | Admitting: Emergency Medicine

## 2013-08-09 ENCOUNTER — Emergency Department (HOSPITAL_COMMUNITY)
Admission: EM | Admit: 2013-08-09 | Discharge: 2013-08-09 | Disposition: A | Discharge status: Home / Self Care | Payer: Medicaid Other | Attending: Emergency Medicine | Admitting: Emergency Medicine

## 2013-08-09 DIAGNOSIS — N938 Other specified abnormal uterine and vaginal bleeding: Secondary | ICD-10-CM

## 2013-08-09 DIAGNOSIS — R1032 Left lower quadrant pain: Secondary | ICD-10-CM | POA: Insufficient documentation

## 2013-08-09 DIAGNOSIS — J45909 Unspecified asthma, uncomplicated: Secondary | ICD-10-CM | POA: Insufficient documentation

## 2013-08-09 DIAGNOSIS — N925 Other specified irregular menstruation: Secondary | ICD-10-CM | POA: Insufficient documentation

## 2013-08-09 DIAGNOSIS — Z3202 Encounter for pregnancy test, result negative: Secondary | ICD-10-CM | POA: Insufficient documentation

## 2013-08-09 DIAGNOSIS — Z79899 Other long term (current) drug therapy: Secondary | ICD-10-CM | POA: Insufficient documentation

## 2013-08-09 DIAGNOSIS — F17200 Nicotine dependence, unspecified, uncomplicated: Secondary | ICD-10-CM | POA: Insufficient documentation

## 2013-08-09 DIAGNOSIS — R110 Nausea: Secondary | ICD-10-CM | POA: Insufficient documentation

## 2013-08-09 DIAGNOSIS — Z8619 Personal history of other infectious and parasitic diseases: Secondary | ICD-10-CM | POA: Insufficient documentation

## 2013-08-09 DIAGNOSIS — Z8659 Personal history of other mental and behavioral disorders: Secondary | ICD-10-CM | POA: Insufficient documentation

## 2013-08-09 DIAGNOSIS — N949 Unspecified condition associated with female genital organs and menstrual cycle: Secondary | ICD-10-CM | POA: Insufficient documentation

## 2013-08-09 DIAGNOSIS — Z8679 Personal history of other diseases of the circulatory system: Secondary | ICD-10-CM | POA: Insufficient documentation

## 2013-08-09 LAB — URINALYSIS, ROUTINE W REFLEX MICROSCOPIC
BILIRUBIN URINE: NEGATIVE
Glucose, UA: NEGATIVE mg/dL
Hgb urine dipstick: NEGATIVE
Ketones, ur: NEGATIVE mg/dL
Leukocytes, UA: NEGATIVE
Nitrite: NEGATIVE
PROTEIN: NEGATIVE mg/dL
Specific Gravity, Urine: 1.02 (ref 1.005–1.030)
UROBILINOGEN UA: 0.2 mg/dL (ref 0.0–1.0)
pH: 6 (ref 5.0–8.0)

## 2013-08-09 LAB — WET PREP, GENITAL
Clue Cells Wet Prep HPF POC: NONE SEEN
TRICH WET PREP: NONE SEEN
Yeast Wet Prep HPF POC: NONE SEEN

## 2013-08-09 LAB — PREGNANCY, URINE: Preg Test, Ur: NEGATIVE

## 2013-08-09 MED ORDER — KETOROLAC TROMETHAMINE 60 MG/2ML IM SOLN
60.0000 mg | Freq: Once | INTRAMUSCULAR | Status: AC
  Administered 2013-08-09: 60 mg via INTRAMUSCULAR
  Filled 2013-08-09: qty 2

## 2013-08-09 MED ORDER — DOXYCYCLINE HYCLATE 100 MG PO CAPS: 100.0000 mg | ORAL_CAPSULE | Freq: Two times a day (BID) | ORAL | Status: DC

## 2013-08-09 MED ORDER — NAPROXEN 375 MG PO TABS: 375.0000 mg | ORAL_TABLET | Freq: Two times a day (BID) | ORAL | Status: DC

## 2013-08-09 NOTE — ED Notes (Signed)
Pt reports last month had a period last only lasted for 3 days and today has had heavy vaginal bleeding.  Pt says she thinks she may have been pregnant.

## 2013-08-09 NOTE — ED Provider Notes (Signed)
CSN: [id number]     Arrival date & time 08/09/13  1502 History   This chart was scribed for Ward Givens, MD by Bennett Scrape, ED Scribe. This patient was seen in room APA03/APA03 and the patient's care was started at 5:45 PM.   Chief Complaint  Patient presents with  . Vaginal Bleeding    The history is provided by the patient. No language interpreter was used.    HPI Comments: Ashley Simon is a 28 y.o. female who presents to the Emergency Department complaining of vaginal bleeding described as passing clots that started today with associated bilateral breast soreness with  Nausea the past 3 weeks and LLQ pain today that radiates into her left thigh and into the left knee. She states that at baseline her menses are regular and always starts slow with spotting that then works up to clots. She reports that her LNMP was 06/10/13. She states that she then had one episode that consisted of 3 days of light vagianl spotting that started on 07/08/13. She is G3P3A0, youngest child is 5 months old. She states that the breast soreness and nausea are similar to prior pregnancies and admits that she believed that she was pregnant due to these symptoms prior to the onset of the vaginal bleeding today. She denies doing a home pregnancy test stating "whatever happens, happens".She states that she had been using condoms "sometimes" She admits that last sexual intercourse was 2 weeks which was unprotected.   No PCP Used [facility] for prior pregnancies.    Past Medical History  Diagnosis Date  . Asthma   . Depression   . Anxiety   . Migraine   . Hx of chlamydia infection   . Abnormal Pap smear   . BV (bacterial vaginosis) 09/07/2012  . Vaginal discharge 12/01/2012    +clue cells will rx metrogel and get GC/CHL  states she is O+ blood type  Past Surgical History  Procedure Laterality Date  . No past surgeries     Family History  Problem Relation Age of Onset  . Other Neg Hx   . Hypertension  Paternal Grandfather   . Diabetes Paternal Grandfather   . Hypertension Paternal Grandmother   . Diabetes Paternal Grandmother   . Cancer Paternal Grandmother     breast  . Hypertension Maternal Grandmother   . Coronary artery disease Maternal Grandfather   . Hypertension Maternal Grandfather   . Diabetes Father   . Asthma Mother   . Cancer Mother     breast  . Cancer Maternal Aunt     breast and ovarian   History  Substance Use Topics  . Smoking status: Current Every Day Smoker -- 0.25 packs/day for .5 years    Types: Cigarettes  . Smokeless tobacco: Never Used  . Alcohol Use: No  Works in Set designer at [employer]  OB History   Grav Para Term Preterm Abortions TAB SAB Ect Mult Living   3 3 3       3      Review of Systems  Gastrointestinal: Positive for nausea and abdominal pain. Negative for vomiting.  Genitourinary: Positive for vaginal bleeding.  Musculoskeletal:       + for bilateral breast soreness   All other systems reviewed and are negative.    Allergies  Calcium-containing compounds  Home Medications   Prior to Admission medications   Medication Sig Start Date End Date Taking? Authorizing Provider  albuterol (PROVENTIL HFA;VENTOLIN HFA) 108 (90 BASE) MCG/ACT  inhaler Inhale 2 puffs into the lungs every 6 (six) hours as needed. For shortness of breath    Historical Provider, MD   Triage vitals: BP 106/65  Pulse 71  Temp(Src) 97.6 F (36.4 C) (Oral)  Resp 18  Ht 5\' 2"  (1.575 m)  Wt 110 lb (49.896 kg)  BMI 20.11 kg/m2  SpO2 100%  LMP 07/12/2013  Vital signs normal    Physical Exam  Nursing note and vitals reviewed. Constitutional: She is oriented to person, place, and time. She appears well-developed and well-nourished.  Non-toxic appearance. She does not appear ill. No distress.  Appears uncomfortable   HENT:  Head: Normocephalic and atraumatic.  Nose: No mucosal edema or rhinorrhea.  Mouth/Throat: Mucous membranes are normal.  Eyes: EOM are  normal.  Neck: Normal range of motion and full passive range of motion without pain. Neck supple.  Cardiovascular: Normal rate, regular rhythm and normal heart sounds.  Exam reveals no gallop and no friction rub.   No murmur heard. Pulmonary/Chest: Effort normal and breath sounds normal. No respiratory distress. She has no wheezes. She has no rhonchi. She has no rales. She exhibits no crepitus.  Abdominal: Soft. Normal appearance and bowel sounds are normal. She exhibits no distension. There is tenderness (LLQ). There is no rebound and no guarding.  No tenderness on the right  Genitourinary:  Normal external genitalia, blood in vault. Tender normal size uterus, nontender adnexa and no masses  Musculoskeletal: Normal range of motion. She exhibits no edema and no tenderness.  Moves all extremities well.   Neurological: She is alert and oriented to person, place, and time. She has normal strength. No cranial nerve deficit.  Skin: Skin is warm, dry and intact. No rash noted. No erythema. No pallor.  Psychiatric: She has a normal mood and affect. Her speech is normal and behavior is normal. Her mood appears not anxious.    ED Course  Procedures (including critical care time)  Medications  ketorolac (TORADOL) injection 60 mg (60 mg Intramuscular Given 08/09/13 1909)     DIAGNOSTIC STUDIES: Oxygen Saturation is 100% on RA, normal by my interpretation.    COORDINATION OF CARE: 5:48 PM-Discussed treatment plan which includes pelvic exam, UA and STD testing with pt at bedside and pt agreed to plan.   Labs Review Results for orders placed during the hospital encounter of 08/09/13  WET PREP, GENITAL      Result Value Ref Range   Yeast Wet Prep HPF POC NONE SEEN  NONE SEEN   Trich, Wet Prep NONE SEEN  NONE SEEN   Clue Cells Wet Prep HPF POC NONE SEEN  NONE SEEN   WBC, Wet Prep HPF POC FEW (*) NONE SEEN  URINALYSIS, ROUTINE W REFLEX MICROSCOPIC      Result Value Ref Range   Color, Urine  YELLOW  YELLOW   APPearance CLEAR  CLEAR   Specific Gravity, Urine 1.020  1.005 - 1.030   pH 6.0  5.0 - 8.0   Glucose, UA NEGATIVE  NEGATIVE mg/dL   Hgb urine dipstick NEGATIVE  NEGATIVE   Bilirubin Urine NEGATIVE  NEGATIVE   Ketones, ur NEGATIVE  NEGATIVE mg/dL   Protein, ur NEGATIVE  NEGATIVE mg/dL   Urobilinogen, UA 0.2  0.0 - 1.0 mg/dL   Nitrite NEGATIVE  NEGATIVE   Leukocytes, UA NEGATIVE  NEGATIVE  PREGNANCY, URINE      Result Value Ref Range   Preg Test, Ur NEGATIVE  NEGATIVE   Laboratory interpretation all normal  Imaging Review No results found.   EKG Interpretation None      MDM   Final diagnoses:  DUB (dysfunctional uterine bleeding)    New Prescriptions   DOXYCYCLINE (VIBRAMYCIN) 100 MG CAPSULE    Take 1 capsule (100 mg total) by mouth 2 (two) times daily.   NAPROXEN (NAPROSYN) 375 MG TABLET    Take 1 tablet (375 mg total) by mouth 2 (two) times daily.    Plan discharge   Devoria AlbeIva Jacqui Headen, MD, FACEP   I personally performed the services described in this documentation, which was scribed in my presence. The recorded information has been reviewed and considered.  Devoria AlbeIva Briseida Gittings, MD, FACEP      Ward GivensIva L Keller Mikels, MD 08/09/13 [phone]

## 2013-08-09 NOTE — Discharge Instructions (Signed)
You will be called if your STD tests are positive. The tests that came back tonight are normal, but I started you on an antibiotic for possible infection. Return to the ED if you get a fever, or have uncontrolled vomiting or worsening pain.    Abnormal Uterine Bleeding Abnormal uterine bleeding can affect women at various stages in life, including teenagers, women in their reproductive years, pregnant women, and women who have reached menopause. Several kinds of uterine bleeding are considered abnormal, including:  Bleeding or spotting between periods.   Bleeding after sexual intercourse.   Bleeding that is heavier or more than normal.   Periods that last longer than usual.  Bleeding after menopause.  Many cases of abnormal uterine bleeding are minor and simple to treat, while others are more serious. Any type of abnormal bleeding should be evaluated by your health care provider. Treatment will depend on the cause of the bleeding. HOME CARE INSTRUCTIONS Monitor your condition for any changes. The following actions may help to alleviate any discomfort you are experiencing:  Avoid the use of tampons and douches as directed by your health care provider.  Change your pads frequently. You should get regular pelvic exams and Pap tests. Keep all follow-up appointments for diagnostic tests as directed by your health care provider.  SEEK MEDICAL CARE IF:   Your bleeding lasts more than 1 week.   You feel dizzy at times.  SEEK IMMEDIATE MEDICAL CARE IF:   You pass out.   You are changing pads every 15 to 30 minutes.   You have abdominal pain.  You have a fever.   You become sweaty or weak.   You are passing large blood clots from the vagina.   You start to feel nauseous and vomit. MAKE SURE YOU:   Understand these instructions.  Will watch your condition.  Will get help right away if you are not doing well or get worse. Document Released: 04/07/2005 Document  Revised: 12/08/2012 Document Reviewed: 11/04/2012 [facility] Patient Information 2014 [place] [place].

## 2013-08-09 NOTE — ED Notes (Signed)
Pt says the vaginal bleeding started at 2pm and is able to wear a pad.

## 2013-08-10 LAB — RPR

## 2013-08-11 LAB — GC/CHLAMYDIA PROBE AMP
CT PROBE, AMP APTIMA: POSITIVE — AB
GC Probe RNA: NEGATIVE

## 2013-08-12 ENCOUNTER — Telehealth (HOSPITAL_BASED_OUTPATIENT_CLINIC_OR_DEPARTMENT_OTHER): Payer: Self-pay | Admitting: Emergency Medicine

## 2013-08-12 NOTE — Telephone Encounter (Signed)
+  Chlamydia. Patient treated with Doxycycline. [employer] faxed. 

## 2014-02-20 ENCOUNTER — Encounter (HOSPITAL_COMMUNITY): Payer: Self-pay | Admitting: Emergency Medicine

## 2014-10-02 ENCOUNTER — Encounter (HOSPITAL_COMMUNITY): Payer: Self-pay | Admitting: Emergency Medicine

## 2014-10-02 ENCOUNTER — Emergency Department (HOSPITAL_COMMUNITY): Payer: Medicaid Other

## 2014-10-02 ENCOUNTER — Emergency Department (HOSPITAL_COMMUNITY)
Admission: EM | Admit: 2014-10-02 | Discharge: 2014-10-02 | Disposition: A | Discharge status: Home / Self Care | Payer: Medicaid Other | Attending: Emergency Medicine | Admitting: Emergency Medicine

## 2014-10-02 DIAGNOSIS — R51 Headache: Secondary | ICD-10-CM | POA: Diagnosis present

## 2014-10-02 DIAGNOSIS — Z792 Long term (current) use of antibiotics: Secondary | ICD-10-CM | POA: Insufficient documentation

## 2014-10-02 DIAGNOSIS — Z720 Tobacco use: Secondary | ICD-10-CM | POA: Diagnosis not present

## 2014-10-02 DIAGNOSIS — Z79899 Other long term (current) drug therapy: Secondary | ICD-10-CM | POA: Insufficient documentation

## 2014-10-02 DIAGNOSIS — Z3490 Encounter for supervision of normal pregnancy, unspecified, unspecified trimester: Secondary | ICD-10-CM

## 2014-10-02 DIAGNOSIS — Z331 Pregnant state, incidental: Secondary | ICD-10-CM | POA: Insufficient documentation

## 2014-10-02 DIAGNOSIS — Z8619 Personal history of other infectious and parasitic diseases: Secondary | ICD-10-CM | POA: Diagnosis not present

## 2014-10-02 DIAGNOSIS — G43909 Migraine, unspecified, not intractable, without status migrainosus: Secondary | ICD-10-CM | POA: Diagnosis not present

## 2014-10-02 DIAGNOSIS — Z8742 Personal history of other diseases of the female genital tract: Secondary | ICD-10-CM | POA: Insufficient documentation

## 2014-10-02 DIAGNOSIS — Z8659 Personal history of other mental and behavioral disorders: Secondary | ICD-10-CM | POA: Insufficient documentation

## 2014-10-02 DIAGNOSIS — J45909 Unspecified asthma, uncomplicated: Secondary | ICD-10-CM | POA: Insufficient documentation

## 2014-10-02 DIAGNOSIS — Z791 Long term (current) use of non-steroidal anti-inflammatories (NSAID): Secondary | ICD-10-CM | POA: Insufficient documentation

## 2014-10-02 DIAGNOSIS — G43009 Migraine without aura, not intractable, without status migrainosus: Secondary | ICD-10-CM

## 2014-10-02 LAB — URINE MICROSCOPIC-ADD ON

## 2014-10-02 LAB — URINALYSIS, ROUTINE W REFLEX MICROSCOPIC
Bilirubin Urine: NEGATIVE
GLUCOSE, UA: NEGATIVE mg/dL
Hgb urine dipstick: NEGATIVE
KETONES UR: NEGATIVE mg/dL
NITRITE: NEGATIVE
Protein, ur: NEGATIVE mg/dL
Specific Gravity, Urine: 1.025 (ref 1.005–1.030)
UROBILINOGEN UA: 1 mg/dL (ref 0.0–1.0)
pH: 6 (ref 5.0–8.0)

## 2014-10-02 LAB — PREGNANCY, URINE: Preg Test, Ur: POSITIVE — AB

## 2014-10-02 MED ORDER — METOCLOPRAMIDE HCL 5 MG/ML IJ SOLN
10.0000 mg | Freq: Once | INTRAMUSCULAR | Status: AC
  Administered 2014-10-02: 10 mg via INTRAVENOUS
  Filled 2014-10-02: qty 2

## 2014-10-02 MED ORDER — DEXAMETHASONE SODIUM PHOSPHATE 4 MG/ML IJ SOLN
10.0000 mg | Freq: Once | INTRAMUSCULAR | Status: AC
  Administered 2014-10-02: 10 mg via INTRAVENOUS
  Filled 2014-10-02: qty 3

## 2014-10-02 MED ORDER — SODIUM CHLORIDE 0.9 % IV BOLUS (SEPSIS)
1000.0000 mL | Freq: Once | INTRAVENOUS | Status: AC
  Administered 2014-10-02: 1000 mL via INTRAVENOUS

## 2014-10-02 MED ORDER — DIPHENHYDRAMINE HCL 50 MG/ML IJ SOLN
25.0000 mg | Freq: Once | INTRAMUSCULAR | Status: DC
  Filled 2014-10-02: qty 1

## 2014-10-02 MED ORDER — KETOROLAC TROMETHAMINE 30 MG/ML IJ SOLN
30.0000 mg | Freq: Once | INTRAMUSCULAR | Status: AC
  Administered 2014-10-02: 30 mg via INTRAVENOUS
  Filled 2014-10-02: qty 1

## 2014-10-02 MED ORDER — DIPHENHYDRAMINE HCL 50 MG/ML IJ SOLN
50.0000 mg | Freq: Once | INTRAMUSCULAR | Status: AC
  Administered 2014-10-02: 50 mg via INTRAVENOUS
  Filled 2014-10-02: qty 1

## 2014-10-02 MED ORDER — ONDANSETRON HCL 4 MG/2ML IJ SOLN
4.0000 mg | Freq: Once | INTRAMUSCULAR | Status: AC
  Administered 2014-10-02: 4 mg via INTRAVENOUS

## 2014-10-02 MED ORDER — ONDANSETRON HCL 4 MG/2ML IJ SOLN
INTRAMUSCULAR | Status: AC
  Filled 2014-10-02: qty 2

## 2014-10-02 NOTE — ED Notes (Addendum)
POC pregnancy is positive.  POC machine not recognizing patient's ID as valid ID.

## 2014-10-02 NOTE — ED Notes (Signed)
Patient with no complaints at this time. Respirations even and unlabored. Skin warm/dry. Discharge instructions reviewed with patient at this time. Patient given opportunity to voice concerns/ask questions. IV removed per policy and band-aid applied to site. Patient discharged at this time and left Emergency Department with steady gait.  

## 2014-10-02 NOTE — ED Notes (Signed)
Patient complaining of headache off and on x 3-4 months. States she came in today "because I'm just tired of it." States this headache started today.

## 2014-10-02 NOTE — ED Provider Notes (Signed)
TIME SEEN: 7:40 PM  CHIEF COMPLAINT: Headache  HPI: Pt is a 29 y.o. female with history of migraine headaches, asthma, depression and anxiety who presents to the emergency department with left-sided intermittent headaches for the past 4 months. States she has tried Excedrin, aspirin, ibuprofen and Tylenol at home without relief. They feel similar to her prior migraines but have been more persistent. No head injury. No fever. Pain is worse with lights and sounds. She has had nausea but no vomiting. No numbness, tingling or focal weakness. Is not on anticoagulation.  ROS: See HPI Constitutional: no fever  Eyes: no drainage  ENT: no runny nose   Cardiovascular:  no chest pain  Resp: no SOB  GI: no vomiting GU: no dysuria Integumentary: no rash  Allergy: no hives  Musculoskeletal: no leg swelling  Neurological: no slurred speech ROS otherwise negative  PAST MEDICAL HISTORY/PAST SURGICAL HISTORY:  Past Medical History  Diagnosis Date  . Asthma   . Depression   . Anxiety   . Migraine   . Hx of chlamydia infection   . Abnormal Pap smear   . BV (bacterial vaginosis) 09/07/2012  . Vaginal discharge 12/01/2012    +clue cells will rx metrogel and get GC/CHL    MEDICATIONS:  Prior to Admission medications   Medication Sig Start Date End Date Taking? Authorizing Provider  albuterol (PROVENTIL HFA;VENTOLIN HFA) 108 (90 BASE) MCG/ACT inhaler Inhale 2 puffs into the lungs every 6 (six) hours as needed. For shortness of breath    Historical Provider, MD  doxycycline (VIBRAMYCIN) 100 MG capsule Take 1 capsule (100 mg total) by mouth 2 (two) times daily. 08/09/13   Devoria Albe, MD  naproxen (NAPROSYN) 375 MG tablet Take 1 tablet (375 mg total) by mouth 2 (two) times daily. 08/09/13   Devoria Albe, MD    ALLERGIES:  Allergies  Allergen Reactions  . Calcium-Containing Compounds Nausea And Vomiting    SOCIAL HISTORY:  History  Substance Use Topics  . Smoking status: Current Every Day Smoker --  3.00 packs/day for .5 years    Types: Cigarettes  . Smokeless tobacco: Never Used  . Alcohol Use: No    FAMILY HISTORY: Family History  Problem Relation Age of Onset  . Other Neg Hx   . Hypertension Paternal Grandfather   . Diabetes Paternal Grandfather   . Hypertension Paternal Grandmother   . Diabetes Paternal Grandmother   . Cancer Paternal Grandmother     breast  . Hypertension Maternal Grandmother   . Coronary artery disease Maternal Grandfather   . Hypertension Maternal Grandfather   . Diabetes Father   . Asthma Mother   . Cancer Mother     breast  . Cancer Maternal Aunt     breast and ovarian    EXAM: BP 97/54 mmHg  Pulse 108  Temp(Src) 98.6 F (37 C) (Oral)  Resp 17  Ht 5\' 2"  (1.575 m)  Wt 120 lb (54.432 kg)  BMI 21.94 kg/m2  SpO2 100%  LMP 09/01/2014 CONSTITUTIONAL: Alert and oriented and responds appropriately to questions. Well-appearing; well-nourished, tearful HEAD: Normocephalic EYES: Conjunctivae clear, PERRL, has photophobia ENT: normal nose; no rhinorrhea; moist mucous membranes; pharynx without lesions noted NECK: Supple, no meningismus, no LAD  CARD: RRR; S1 and S2 appreciated; no murmurs, no clicks, no rubs, no gallops RESP: Normal chest excursion without splinting or tachypnea; breath sounds clear and equal bilaterally; no wheezes, no rhonchi, no rales, no hypoxia or respiratory distress, speaking full sentences ABD/GI: Normal bowel  sounds; non-distended; soft, non-tender, no rebound, no guarding, no peritoneal signs BACK:  The back appears normal and is non-tender to palpation, there is no CVA tenderness EXT: Normal ROM in all joints; non-tender to palpation; no edema; normal capillary refill; no cyanosis, no calf tenderness or swelling    SKIN: Normal color for age and race; warm NEURO: Moves all extremities equally, sensation to light touch intact diffusely, cranial nerves II through XII intact PSYCH: The patient's mood and manner are  appropriate. Grooming and personal hygiene are appropriate.  MEDICAL DECISION MAKING: Patient here with migraine headache. Reports more persistent over the past several months. We'll give migraine cocktail. Given change in her headache will obtain a CT of her head.  ED PROGRESS: Patient's headache gone. Head CT shows no acute intracranial abnormality. Suspect these are migrane headaches have discussed with her that she will need to follow-up with a PCP and neurologist for further control of her headaches. Her point care pregnancy test was positive today. She states her last menstrual period was May 13. Denies any current dysuria, hematuria, vaginal bleeding or discharge. No abdominal pain.  She states that she will not plan on keeping this pregnancy. She does not want me to discharge her with prenatal vitamins. Have advised her that Tylenol is only safe medication to take if she does continue to carry this pregnancy. Otherwise her recommend follow-up with neurology and I will give her this follow up information. Discussed return precautions. She verbalized understanding and is comfortable with plan.     Ashley Maw Hettie Roselli, DO 10/02/14 2138

## 2014-10-02 NOTE — Discharge Instructions (Signed)
First Trimester of Pregnancy °The first trimester of pregnancy is from week 1 until the end of week 12 (months 1 through 3). A week after a sperm fertilizes an egg, the egg will implant on the wall of the uterus. This embryo will begin to develop into a baby. Genes from you and your partner are forming the baby. The female genes determine whether the baby is a boy or a girl. At 6-8 weeks, the eyes and face are formed, and the heartbeat can be seen on ultrasound. At the end of 12 weeks, all the baby's organs are formed.  °Now that you are pregnant, you will want to do everything you can to have a healthy baby. Two of the most important things are to get good prenatal care and to follow your health care provider's instructions. Prenatal care is all the medical care you receive before the baby's birth. This care will help prevent, find, and treat any problems during the pregnancy and childbirth. °BODY CHANGES °Your body goes through many changes during pregnancy. The changes vary from woman to woman.  °· You may gain or lose a couple of pounds at first. °· You may feel sick to your stomach (nauseous) and throw up (vomit). If the vomiting is uncontrollable, call your health care provider. °· You may tire easily. °· You may develop headaches that can be relieved by medicines approved by your health care provider. °· You may urinate more often. Painful urination may mean you have a bladder infection. °· You may develop heartburn as a result of your pregnancy. °· You may develop constipation because certain hormones are causing the muscles that push waste through your intestines to slow down. °· You may develop hemorrhoids or swollen, bulging veins (varicose veins). °· Your breasts may begin to grow larger and become tender. Your nipples may stick out more, and the tissue that surrounds them (areola) may become darker. °· Your gums may bleed and may be sensitive to brushing and flossing. °· Dark spots or blotches (chloasma,  mask of pregnancy) may develop on your face. This will likely fade after the baby is born. °· Your menstrual periods will stop. °· You may have a loss of appetite. °· You may develop cravings for certain kinds of food. °· You may have changes in your emotions from day to day, such as being excited to be pregnant or being concerned that something may go wrong with the pregnancy and baby. °· You may have more vivid and strange dreams. °· You may have changes in your hair. These can include thickening of your hair, rapid growth, and changes in texture. Some women also have hair loss during or after pregnancy, or hair that feels dry or thin. Your hair will most likely return to normal after your baby is born. °WHAT TO EXPECT AT YOUR PRENATAL VISITS °During a routine prenatal visit: °· You will be weighed to make sure you and the baby are growing normally. °· Your blood pressure will be taken. °· Your abdomen will be measured to track your baby's growth. °· The fetal heartbeat will be listened to starting around week 10 or 12 of your pregnancy. °· Test results from any previous visits will be discussed. °Your health care provider may ask you: °· How you are feeling. °· If you are feeling the baby move. °· If you have had any abnormal symptoms, such as leaking fluid, bleeding, severe headaches, or abdominal cramping. °· If you have any questions. °Other tests   that may be performed during your first trimester include: °· Blood tests to find your blood type and to check for the presence of any previous infections. They will also be used to check for low iron levels (anemia) and Rh antibodies. Later in the pregnancy, blood tests for diabetes will be done along with other tests if problems develop. °· Urine tests to check for infections, diabetes, or protein in the urine. °· An ultrasound to confirm the proper growth and development of the baby. °· An amniocentesis to check for possible genetic problems. °· Fetal screens for  spina bifida and Down syndrome. °· You may need other tests to make sure you and the baby are doing well. °HOME CARE INSTRUCTIONS  °Medicines °· Follow your health care provider's instructions regarding medicine use. Specific medicines may be either safe or unsafe to take during pregnancy. °· Take your prenatal vitamins as directed. °· If you develop constipation, try taking a stool softener if your health care provider approves. °Diet °· Eat regular, well-balanced meals. Choose a variety of foods, such as meat or vegetable-based protein, fish, milk and low-fat dairy products, vegetables, fruits, and whole grain breads and cereals. Your health care provider will help you determine the amount of weight gain that is right for you. °· Avoid raw meat and uncooked cheese. These carry germs that can cause birth defects in the baby. °· Eating four or five small meals rather than three large meals a day may help relieve nausea and vomiting. If you start to feel nauseous, eating a few soda crackers can be helpful. Drinking liquids between meals instead of during meals also seems to help nausea and vomiting. °· If you develop constipation, eat more high-fiber foods, such as fresh vegetables or fruit and whole grains. Drink enough fluids to keep your urine clear or pale yellow. °Activity and Exercise °· Exercise only as directed by your health care provider. Exercising will help you: °· Control your weight. °· Stay in shape. °· Be prepared for labor and delivery. °· Experiencing pain or cramping in the lower abdomen or low back is a good sign that you should stop exercising. Check with your health care provider before continuing normal exercises. °· Try to avoid standing for long periods of time. Move your legs often if you must stand in one place for a long time. °· Avoid heavy lifting. °· Wear low-heeled shoes, and practice good posture. °· You may continue to have sex unless your health care provider directs you  otherwise. °Relief of Pain or Discomfort °· Wear a good support bra for breast tenderness.   °· Take warm sitz baths to soothe any pain or discomfort caused by hemorrhoids. Use hemorrhoid cream if your health care provider approves.   °· Rest with your legs elevated if you have leg cramps or low back pain. °· If you develop varicose veins in your legs, wear support hose. Elevate your feet for 15 minutes, 3-4 times a day. Limit salt in your diet. °Prenatal Care °· Schedule your prenatal visits by the twelfth week of pregnancy. They are usually scheduled monthly at first, then more often in the last 2 months before delivery. °· Write down your questions. Take them to your prenatal visits. °· Keep all your prenatal visits as directed by your health care provider. °Safety °· Wear your seat belt at all times when driving. °· Make a list of emergency phone numbers, including numbers for family, friends, the hospital, and police and fire departments. °General Tips °·   Ask your health care provider for a referral to a local prenatal education class. Begin classes no later than at the beginning of month 6 of your pregnancy.  Ask for help if you have counseling or nutritional needs during pregnancy. Your health care provider can offer advice or refer you to specialists for help with various needs.  Do not use hot tubs, steam rooms, or saunas.  Do not douche or use tampons or scented sanitary pads.  Do not cross your legs for long periods of time.  Avoid cat litter boxes and soil used by cats. These carry germs that can cause birth defects in the baby and possibly loss of the fetus by miscarriage or stillbirth.  Avoid all smoking, herbs, alcohol, and medicines not prescribed by your health care provider. Chemicals in these affect the formation and growth of the baby.  Schedule a dentist appointment. At home, brush your teeth with a soft toothbrush and be gentle when you floss. SEEK MEDICAL CARE IF:   You have  dizziness.  You have mild pelvic cramps, pelvic pressure, or nagging pain in the abdominal area.  You have persistent nausea, vomiting, or diarrhea.  You have a bad smelling vaginal discharge.  You have pain with urination.  You notice increased swelling in your face, hands, legs, or ankles. SEEK IMMEDIATE MEDICAL CARE IF:   You have a fever.  You are leaking fluid from your vagina.  You have spotting or bleeding from your vagina.  You have severe abdominal cramping or pain.  You have rapid weight gain or loss.  You vomit blood or material that looks like coffee grounds.  You are exposed to [place] and have never had them.  You are exposed to fifth disease or chickenpox.  You develop a severe headache.  You have shortness of breath.  You have any kind of trauma, such as from a fall or a car accident. Document Released: 04/01/2001 Document Revised: 08/22/2013 Document Reviewed: 02/15/2013 [facility] Patient Information 2015 [place], [place]. This information is not intended to replace advice given to you by your health care provider. Make sure you discuss any questions you have with your health care provider.  Migraine Headache A migraine headache is an intense, throbbing pain on one or both sides of your head. A migraine can last for 30 minutes to several hours. CAUSES  The exact cause of a migraine headache is not always known. However, a migraine may be caused when nerves in the brain become irritated and release chemicals that cause inflammation. This causes pain. Certain things may also trigger migraines, such as:  Alcohol.  Smoking.  Stress.  Menstruation.  Aged cheeses.  Foods or drinks that contain nitrates, glutamate, aspartame, or tyramine.  Lack of sleep.  Chocolate.  Caffeine.  Hunger.  Physical exertion.  Fatigue.  Medicines used to treat chest pain (nitroglycerine), birth control pills, estrogen, and some blood pressure  medicines. SIGNS AND SYMPTOMS  Pain on one or both sides of your head.  Pulsating or throbbing pain.  Severe pain that prevents daily activities.  Pain that is aggravated by any physical activity.  Nausea, vomiting, or both.  Dizziness.  Pain with exposure to bright lights, loud noises, or activity.  General sensitivity to bright lights, loud noises, or smells. Before you get a migraine, you may get warning signs that a migraine is coming (aura). An aura may include:  Seeing flashing lights.  Seeing bright spots, halos, or zigzag lines.  Having tunnel vision or  blurred vision.  Having feelings of numbness or tingling.  Having trouble talking.  Having muscle weakness. DIAGNOSIS  A migraine headache is often diagnosed based on:  Symptoms.  Physical exam.  A CT scan or MRI of your head. These imaging tests cannot diagnose migraines, but they can help rule out other causes of headaches. TREATMENT Medicines may be given for pain and nausea. Medicines can also be given to help prevent recurrent migraines.  HOME CARE INSTRUCTIONS  Only take over-the-counter or prescription medicines for pain or discomfort as directed by your health care provider. The use of long-term narcotics is not recommended.  Lie down in a dark, quiet room when you have a migraine.  Keep a journal to find out what may trigger your migraine headaches. For example, write down:  What you eat and drink.  How much sleep you get.  Any change to your diet or medicines.  Limit alcohol consumption.  Quit smoking if you smoke.  Get 7-9 hours of sleep, or as recommended by your health care provider.  Limit stress.  Keep lights dim if bright lights bother you and make your migraines worse. SEEK IMMEDIATE MEDICAL CARE IF:   Your migraine becomes severe.  You have a fever.  You have a stiff neck.  You have vision loss.  You have muscular weakness or loss of muscle control.  You start losing  your balance or have trouble walking.  You feel faint or pass out.  You have severe symptoms that are different from your first symptoms. MAKE SURE YOU:   Understand these instructions.  Will watch your condition.  Will get help right away if you are not doing well or get worse. Document Released: 04/07/2005 Document Revised: 08/22/2013 Document Reviewed: 12/13/2012 [facility] Patient Information 2015 [place], [place]. This information is not intended to replace advice given to you by your health care provider. Make sure you discuss any questions you have with your health care provider.

## 2014-10-03 LAB — POC URINE PREG, ED
PREG TEST UR: POSITIVE — AB
Preg Test, Ur: POSITIVE — AB

## 2014-10-03 NOTE — ED Provider Notes (Signed)
4:25 PM  Pt's urine last night showed Trichomonas that was not treated last night.  I have called patient at home at [phone] and informed her of these results. Have also discussed with her that her partner will need to be tested and treated as well. I have called in prescriptions for Flagyl 2 g by mouth 1 dose, cefixime 400 mg by mouth 1 dose and azithromycin 1000 mg by mouth 1 dose to treat her for Trichomonas as well as empirically for gonorrhea and chlamydia to the [facility] on [address]. Have also discussed with patient that she should be tested for other STDs including HIV and syphilis. She goes to the [facility] health department. Have discussed with her that she can have these tests done there. Discussed return precautions. She verbalized understanding.  Layla Maw Ward, DO 10/03/14 1627

## 2014-10-06 ENCOUNTER — Other Ambulatory Visit: Payer: Self-pay | Admitting: Obstetrics & Gynecology

## 2014-10-06 DIAGNOSIS — O3680X1 Pregnancy with inconclusive fetal viability, fetus 1: Secondary | ICD-10-CM

## 2014-10-09 ENCOUNTER — Other Ambulatory Visit: Payer: Medicaid Other

## 2014-10-16 ENCOUNTER — Encounter: Payer: Self-pay | Admitting: Obstetrics & Gynecology

## 2014-10-16 ENCOUNTER — Other Ambulatory Visit: Payer: Self-pay

## 2014-10-29 ENCOUNTER — Encounter (HOSPITAL_COMMUNITY): Payer: Self-pay | Admitting: Emergency Medicine

## 2014-10-29 ENCOUNTER — Emergency Department (HOSPITAL_COMMUNITY)
Admission: EM | Admit: 2014-10-29 | Discharge: 2014-10-29 | Disposition: A | Discharge status: Home / Self Care | Payer: Medicaid Other | Attending: Emergency Medicine | Admitting: Emergency Medicine

## 2014-10-29 ENCOUNTER — Emergency Department (HOSPITAL_COMMUNITY): Payer: Medicaid Other

## 2014-10-29 DIAGNOSIS — M79652 Pain in left thigh: Secondary | ICD-10-CM | POA: Insufficient documentation

## 2014-10-29 DIAGNOSIS — N390 Urinary tract infection, site not specified: Secondary | ICD-10-CM

## 2014-10-29 DIAGNOSIS — F17210 Nicotine dependence, cigarettes, uncomplicated: Secondary | ICD-10-CM | POA: Diagnosis not present

## 2014-10-29 DIAGNOSIS — O2340 Unspecified infection of urinary tract in pregnancy, unspecified trimester: Secondary | ICD-10-CM | POA: Diagnosis not present

## 2014-10-29 DIAGNOSIS — Z79899 Other long term (current) drug therapy: Secondary | ICD-10-CM | POA: Diagnosis not present

## 2014-10-29 DIAGNOSIS — J45909 Unspecified asthma, uncomplicated: Secondary | ICD-10-CM | POA: Diagnosis not present

## 2014-10-29 DIAGNOSIS — Z8679 Personal history of other diseases of the circulatory system: Secondary | ICD-10-CM | POA: Diagnosis not present

## 2014-10-29 DIAGNOSIS — O99511 Diseases of the respiratory system complicating pregnancy, first trimester: Secondary | ICD-10-CM | POA: Diagnosis not present

## 2014-10-29 DIAGNOSIS — O4690 Antepartum hemorrhage, unspecified, unspecified trimester: Secondary | ICD-10-CM

## 2014-10-29 DIAGNOSIS — Z3490 Encounter for supervision of normal pregnancy, unspecified, unspecified trimester: Secondary | ICD-10-CM

## 2014-10-29 DIAGNOSIS — Z8619 Personal history of other infectious and parasitic diseases: Secondary | ICD-10-CM | POA: Diagnosis not present

## 2014-10-29 DIAGNOSIS — Z8659 Personal history of other mental and behavioral disorders: Secondary | ICD-10-CM | POA: Diagnosis not present

## 2014-10-29 DIAGNOSIS — Z3A08 8 weeks gestation of pregnancy: Secondary | ICD-10-CM | POA: Insufficient documentation

## 2014-10-29 DIAGNOSIS — N898 Other specified noninflammatory disorders of vagina: Secondary | ICD-10-CM | POA: Insufficient documentation

## 2014-10-29 DIAGNOSIS — O9989 Other specified diseases and conditions complicating pregnancy, childbirth and the puerperium: Secondary | ICD-10-CM | POA: Diagnosis present

## 2014-10-29 DIAGNOSIS — O99331 Smoking (tobacco) complicating pregnancy, first trimester: Secondary | ICD-10-CM | POA: Insufficient documentation

## 2014-10-29 LAB — URINALYSIS, ROUTINE W REFLEX MICROSCOPIC
Bilirubin Urine: NEGATIVE
GLUCOSE, UA: NEGATIVE mg/dL
KETONES UR: NEGATIVE mg/dL
NITRITE: NEGATIVE
Urobilinogen, UA: 0.2 mg/dL (ref 0.0–1.0)
pH: 6 (ref 5.0–8.0)

## 2014-10-29 LAB — CBC WITH DIFFERENTIAL/PLATELET
Basophils Absolute: 0 10*3/uL (ref 0.0–0.1)
Basophils Relative: 1 % (ref 0–1)
EOS ABS: 0.2 10*3/uL (ref 0.0–0.7)
Eosinophils Relative: 2 % (ref 0–5)
HEMATOCRIT: 36.9 % (ref 36.0–46.0)
HEMOGLOBIN: 12 g/dL (ref 12.0–15.0)
Lymphocytes Relative: 25 % (ref 12–46)
Lymphs Abs: 2.2 10*3/uL (ref 0.7–4.0)
MCH: 30.8 pg (ref 26.0–34.0)
MCHC: 32.5 g/dL (ref 30.0–36.0)
MCV: 94.6 fL (ref 78.0–100.0)
Monocytes Absolute: 1.3 10*3/uL — ABNORMAL HIGH (ref 0.1–1.0)
Monocytes Relative: 15 % — ABNORMAL HIGH (ref 3–12)
Neutro Abs: 4.9 10*3/uL (ref 1.7–7.7)
Neutrophils Relative %: 57 % (ref 43–77)
Platelets: 204 10*3/uL (ref 150–400)
RBC: 3.9 MIL/uL (ref 3.87–5.11)
RDW: 14.1 % (ref 11.5–15.5)
WBC: 8.5 10*3/uL (ref 4.0–10.5)

## 2014-10-29 LAB — URINE MICROSCOPIC-ADD ON

## 2014-10-29 LAB — COMPREHENSIVE METABOLIC PANEL
ALK PHOS: 48 U/L (ref 38–126)
ALT: 28 U/L (ref 14–54)
AST: 27 U/L (ref 15–41)
Albumin: 3.6 g/dL (ref 3.5–5.0)
Anion gap: 7 (ref 5–15)
BUN: 12 mg/dL (ref 6–20)
CHLORIDE: 107 mmol/L (ref 101–111)
CO2: 23 mmol/L (ref 22–32)
CREATININE: 0.75 mg/dL (ref 0.44–1.00)
Calcium: 9 mg/dL (ref 8.9–10.3)
GFR calc non Af Amer: >60 mL/min (ref >60)
GLUCOSE: 81 mg/dL (ref 65–99)
Potassium: 3.5 mmol/L (ref 3.5–5.1)
Sodium: 137 mmol/L (ref 135–145)
Total Bilirubin: 0.4 mg/dL (ref 0.3–1.2)
Total Protein: 6.6 g/dL (ref 6.5–8.1)

## 2014-10-29 LAB — PREGNANCY, URINE: Preg Test, Ur: POSITIVE — AB

## 2014-10-29 LAB — HCG, QUANTITATIVE, PREGNANCY: hCG, Beta Chain, Quant, S: 249561 m[IU]/mL — ABNORMAL HIGH (ref <5)

## 2014-10-29 LAB — WET PREP, GENITAL
Clue Cells Wet Prep HPF POC: NONE SEEN
Yeast Wet Prep HPF POC: NONE SEEN

## 2014-10-29 LAB — TYPE AND SCREEN
ABO/RH(D): O POS
Antibody Screen: NEGATIVE

## 2014-10-29 MED ORDER — CEPHALEXIN 500 MG PO CAPS: 500.0000 mg | ORAL_CAPSULE | Freq: Four times a day (QID) | ORAL | Status: DC

## 2014-10-29 MED ORDER — SODIUM CHLORIDE 0.9 % IV BOLUS (SEPSIS)
1000.0000 mL | Freq: Once | INTRAVENOUS | Status: AC
  Administered 2014-10-29: 1000 mL via INTRAVENOUS

## 2014-10-29 MED ORDER — CEFTRIAXONE SODIUM 1 G IJ SOLR
1.0000 g | Freq: Once | INTRAMUSCULAR | Status: AC
  Administered 2014-10-29: 1 g via INTRAVENOUS
  Filled 2014-10-29: qty 10

## 2014-10-29 NOTE — ED Notes (Addendum)
Patient c/o bilaterally flank pain, left hip pain, and intermittent vaginal pain. Per patient large amount of bleeding with clots at first but now occasionally spotting. Per patient was seen here in ER 1 month ago and told that she was pregnant. Patient has not had follow-up with ob/gyn and is not sure how far along she is. Patient also unsure of last actual period. Per patient has had some nausea and vomiting as well.

## 2014-10-29 NOTE — ED Provider Notes (Signed)
CSN: [id number]     Arrival date & time 10/29/14  1558 History   First MD Initiated Contact with Patient 10/29/14 1649     Chief Complaint  Patient presents with  . Flank Pain     (Consider location/radiation/quality/duration/timing/severity/associated sxs/prior Treatment) HPI...Marland Kitchen.G4P3 last menstrual period unknown presents with known positive pregnancy test.   She also complains of pain in her left thigh. No dysuria, fever, chills,  ? vaginal bleeding.  Apparently she has trichomonas, but has not taken her prescriptions. She has not seen an obstetrician.  Past Medical History  Diagnosis Date  . Asthma   . Depression   . Anxiety   . Migraine   . Hx of chlamydia infection   . Abnormal Pap smear   . BV (bacterial vaginosis) 09/07/2012  . Vaginal discharge 12/01/2012    +clue cells will rx metrogel and get GC/CHL   Past Surgical History  Procedure Laterality Date  . No past surgeries     Family History  Problem Relation Age of Onset  . Other Neg Hx   . Hypertension Paternal Grandfather   . Diabetes Paternal Grandfather   . Hypertension Paternal Grandmother   . Diabetes Paternal Grandmother   . Cancer Paternal Grandmother     breast  . Hypertension Maternal Grandmother   . Coronary artery disease Maternal Grandfather   . Hypertension Maternal Grandfather   . Diabetes Father   . Asthma Mother   . Cancer Mother     breast  . Cancer Maternal Aunt     breast and ovarian   History  Substance Use Topics  . Smoking status: Current Every Day Smoker -- 3.00 packs/day for .5 years    Types: Cigarettes  . Smokeless tobacco: Never Used  . Alcohol Use: No   OB History    Gravida Para Term Preterm AB TAB SAB Ectopic Multiple Living   4 3 3       3      Review of Systems  All other systems reviewed and are negative.     Allergies  Calcium-containing compounds  Home Medications   Prior to Admission medications   Medication Sig Start Date End Date Taking? Authorizing  Provider  albuterol (PROVENTIL HFA;VENTOLIN HFA) 108 (90 BASE) MCG/ACT inhaler Inhale 2 puffs into the lungs every 6 (six) hours as needed. For shortness of breath   Yes Historical Provider, MD  cephALEXin (KEFLEX) 500 MG capsule Take 1 capsule (500 mg total) by mouth 4 (four) times daily. 10/29/14   Donnetta HutchingBrian Nikita Surman, MD   BP 85/52 mmHg  Pulse 60  Temp(Src) 98.4 F (36.9 C) (Oral)  Resp 20  Ht 5\' 2"  (1.575 m)  Wt 116 lb (52.617 kg)  BMI 21.21 kg/m2  SpO2 100%  LMP 09/01/2014 Physical Exam  Constitutional: She is oriented to person, place, and time. She appears well-developed and well-nourished.  HENT:  Head: Normocephalic and atraumatic.  Eyes: Conjunctivae and EOM are normal. Pupils are equal, round, and reactive to light.  Neck: Normal range of motion. Neck supple.  Cardiovascular: Normal rate and regular rhythm.   Pulmonary/Chest: Effort normal and breath sounds normal.  Abdominal: Soft. Bowel sounds are normal.  Nontender  Genitourinary:  Normal external genitalia. White cervical discharge. No cervical motion tenderness. No adnexal tenderness.  Musculoskeletal: Normal range of motion.  Neurological: She is alert and oriented to person, place, and time.  Skin: Skin is warm and dry.  Psychiatric: She has a normal mood and affect. Her behavior is normal.  Nursing note and vitals reviewed.   ED Course  Procedures (including critical care time) Labs Review Labs Reviewed  WET PREP, GENITAL - Abnormal; Notable for the following:    Trich, Wet Prep FEW (*)    WBC, Wet Prep HPF POC FEW (*)    All other components within normal limits  URINALYSIS, ROUTINE W REFLEX MICROSCOPIC (NOT AT [facility]) - Abnormal; Notable for the following:    Specific Gravity, Urine >1.030 (*)    Hgb urine dipstick LARGE (*)    Protein, ur TRACE (*)    Leukocytes, UA SMALL (*)    All other components within normal limits  PREGNANCY, URINE - Abnormal; Notable for the following:    Preg Test, Ur POSITIVE (*)     All other components within normal limits  CBC WITH DIFFERENTIAL/PLATELET - Abnormal; Notable for the following:    Monocytes Relative 15 (*)    Monocytes Absolute 1.3 (*)    All other components within normal limits  URINE MICROSCOPIC-ADD ON - Abnormal; Notable for the following:    Squamous Epithelial / LPF MANY (*)    Bacteria, UA FEW (*)    All other components within normal limits  HCG, QUANTITATIVE, PREGNANCY - Abnormal; Notable for the following:    hCG, Beta Chain, Mahalia Longest [id number] (*)    All other components within normal limits  URINE CULTURE  COMPREHENSIVE METABOLIC PANEL  HIV ANTIBODY (ROUTINE TESTING)  TYPE AND SCREEN  GC/CHLAMYDIA PROBE AMP () NOT AT [facility]    Imaging Review US Ob Comp Less 14 Wks  10/29/2014   CLINICAL DATA:  First trimester of pregnancy, vaginal bleeding.  EXAM: OBSTETRIC <14 WK [place] AND TRANSVAGINAL OB US  TECHNIQUE: Both transabdominal and transvaginal ultrasound examinations were performed for complete evaluation of the gestation as well as the maternal uterus, adnexal regions, and pelvic cul-de-sac. Transvaginal technique was performed to assess early pregnancy.  COMPARISON:  None.  FINDINGS: Intrauterine gestational sac: Visualized/normal in shape.  Yolk sac:  Visualized.  Embryo:  Visualized.  Cardiac Activity: Visualized.  Heart Rate: 158  bpm  CRL: 17.9 mm 8 w 2 d [place] [facility]: June 08, 2015.  Maternal uterus/adnexae: Small subchorionic hemorrhage is noted. Probable corpus luteum cyst seen in right ovary. Left ovary appears normal.  IMPRESSION: Single live intrauterine gestation of 8 weeks 2 days. Small subchronic hemorrhage is noted.   Electronically Signed   By: Lupita Raider, M.D.   On: 10/29/2014 19:57   US Ob Transvaginal  10/29/2014   CLINICAL DATA:  First trimester of pregnancy, vaginal bleeding.  EXAM: OBSTETRIC <14 WK [place] AND TRANSVAGINAL OB US  TECHNIQUE: Both transabdominal and transvaginal ultrasound examinations were performed for  complete evaluation of the gestation as well as the maternal uterus, adnexal regions, and pelvic cul-de-sac. Transvaginal technique was performed to assess early pregnancy.  COMPARISON:  None.  FINDINGS: Intrauterine gestational sac: Visualized/normal in shape.  Yolk sac:  Visualized.  Embryo:  Visualized.  Cardiac Activity: Visualized.  Heart Rate: 158  bpm  CRL: 17.9 mm 8 w 2 d [place] [facility]: June 08, 2015.  Maternal uterus/adnexae: Small subchorionic hemorrhage is noted. Probable corpus luteum cyst seen in right ovary. Left ovary appears normal.  IMPRESSION: Single live intrauterine gestation of 8 weeks 2 days. Small subchronic hemorrhage is noted.   Electronically Signed   By: Lupita Raider, M.D.   On: 10/29/2014 19:57     EKG Interpretation None      MDM  Final diagnoses:  Pregnancy  UTI (lower urinary tract infection)    No acute abdomen. Ultrasound reveals an 8 week 2 day gestation with small subchorionic hemorrhage. Will Rx for urinary tract infection. Patient encouraged to see Dr. Emelda Fear this week. She has been noncompliant with her medication.    Donnetta Hutching, MD 10/29/14 2101

## 2014-10-29 NOTE — Discharge Instructions (Signed)
You are 8 weeks and 2 days pregnant.   Additionally, you have a urinary tract infection. Prescription for antibiotic. Increase fluids. Rest. Follow-up with Dr. Emelda FearFerguson this week.

## 2014-10-31 LAB — URINE CULTURE
Culture: NO GROWTH
SPECIAL REQUESTS: NORMAL

## 2014-10-31 LAB — HIV ANTIBODY (ROUTINE TESTING W REFLEX): HIV Screen 4th Generation wRfx: NONREACTIVE

## 2014-10-31 LAB — GC/CHLAMYDIA PROBE AMP (~~LOC~~) NOT AT ARMC
Chlamydia: NEGATIVE
Neisseria Gonorrhea: NEGATIVE

## 2015-01-02 ENCOUNTER — Other Ambulatory Visit: Payer: Self-pay | Admitting: Obstetrics and Gynecology

## 2015-01-02 DIAGNOSIS — O3680X0 Pregnancy with inconclusive fetal viability, not applicable or unspecified: Secondary | ICD-10-CM

## 2015-01-03 ENCOUNTER — Other Ambulatory Visit: Payer: Self-pay

## 2015-01-08 ENCOUNTER — Encounter: Payer: Self-pay | Admitting: Women's Health

## 2015-01-09 ENCOUNTER — Encounter: Payer: Self-pay | Admitting: Women's Health

## 2015-01-09 ENCOUNTER — Ambulatory Visit (INDEPENDENT_AMBULATORY_CARE_PROVIDER_SITE_OTHER): Payer: Medicaid Other | Admitting: Women's Health

## 2015-01-09 ENCOUNTER — Other Ambulatory Visit (HOSPITAL_COMMUNITY)
Admission: RE | Admit: 2015-01-09 | Discharge: 2015-01-09 | Disposition: A | Discharge status: Home / Self Care | Payer: Medicaid Other | Source: Ambulatory Visit | Attending: Obstetrics & Gynecology | Admitting: Obstetrics & Gynecology

## 2015-01-09 VITALS — BP 112/58 | HR 60 | Wt 120.0 lb

## 2015-01-09 DIAGNOSIS — Z363 Encounter for antenatal screening for malformations: Secondary | ICD-10-CM

## 2015-01-09 DIAGNOSIS — F17200 Nicotine dependence, unspecified, uncomplicated: Secondary | ICD-10-CM | POA: Insufficient documentation

## 2015-01-09 DIAGNOSIS — Z3492 Encounter for supervision of normal pregnancy, unspecified, second trimester: Secondary | ICD-10-CM

## 2015-01-09 DIAGNOSIS — Z369 Encounter for antenatal screening, unspecified: Secondary | ICD-10-CM

## 2015-01-09 DIAGNOSIS — Z01419 Encounter for gynecological examination (general) (routine) without abnormal findings: Secondary | ICD-10-CM | POA: Diagnosis present

## 2015-01-09 DIAGNOSIS — Z124 Encounter for screening for malignant neoplasm of cervix: Secondary | ICD-10-CM

## 2015-01-09 DIAGNOSIS — Z0283 Encounter for blood-alcohol and blood-drug test: Secondary | ICD-10-CM

## 2015-01-09 DIAGNOSIS — Z1389 Encounter for screening for other disorder: Secondary | ICD-10-CM

## 2015-01-09 DIAGNOSIS — Z3682 Encounter for antenatal screening for nuchal translucency: Secondary | ICD-10-CM

## 2015-01-09 DIAGNOSIS — Z3490 Encounter for supervision of normal pregnancy, unspecified, unspecified trimester: Secondary | ICD-10-CM | POA: Insufficient documentation

## 2015-01-09 DIAGNOSIS — Z113 Encounter for screening for infections with a predominantly sexual mode of transmission: Secondary | ICD-10-CM | POA: Diagnosis present

## 2015-01-09 DIAGNOSIS — Z331 Pregnant state, incidental: Secondary | ICD-10-CM

## 2015-01-09 DIAGNOSIS — F418 Other specified anxiety disorders: Secondary | ICD-10-CM | POA: Insufficient documentation

## 2015-01-09 LAB — POCT URINALYSIS DIPSTICK
Glucose, UA: NEGATIVE
Ketones, UA: NEGATIVE
NITRITE UA: NEGATIVE
PROTEIN UA: NEGATIVE
RBC UA: NEGATIVE

## 2015-01-09 MED ORDER — ALBUTEROL SULFATE HFA 108 (90 BASE) MCG/ACT IN AERS: 1.0000 | INHALATION_SPRAY | Freq: Four times a day (QID) | RESPIRATORY_TRACT | Status: DC | PRN

## 2015-01-09 MED ORDER — CITRANATAL ASSURE 35-1 & 300 MG PO MISC: ORAL | Status: DC

## 2015-01-09 NOTE — Patient Instructions (Signed)
Second Trimester of Pregnancy The second trimester is from week 13 through week 28, months 4 through 6. The second trimester is often a time when you feel your best. Your body has also adjusted to being pregnant, and you begin to feel better physically. Usually, morning sickness has lessened or quit completely, you may have more energy, and you may have an increase in appetite. The second trimester is also a time when the fetus is growing rapidly. At the end of the sixth month, the fetus is about 9 inches long and weighs about 1 pounds. You will likely begin to feel the baby move (quickening) between 18 and 20 weeks of the pregnancy. BODY CHANGES Your body goes through many changes during pregnancy. The changes vary from woman to woman.   Your weight will continue to increase. You will notice your lower abdomen bulging out.  You may begin to get stretch marks on your hips, abdomen, and breasts.  You may develop headaches that can be relieved by medicines approved by your health care provider.  You may urinate more often because the fetus is pressing on your bladder.  You may develop or continue to have heartburn as a result of your pregnancy.  You may develop constipation because certain hormones are causing the muscles that push waste through your intestines to slow down.  You may develop hemorrhoids or swollen, bulging veins (varicose veins).  You may have back pain because of the weight gain and pregnancy hormones relaxing your joints between the bones in your pelvis and as a result of a shift in weight and the muscles that support your balance.  Your breasts will continue to grow and be tender.  Your gums may bleed and may be sensitive to brushing and flossing.  Dark spots or blotches (chloasma, mask of pregnancy) may develop on your face. This will likely fade after the baby is born.  A dark line from your belly button to the pubic area (linea nigra) may appear. This will likely fade  after the baby is born.  You may have changes in your hair. These can include thickening of your hair, rapid growth, and changes in texture. Some women also have hair loss during or after pregnancy, or hair that feels dry or thin. Your hair will most likely return to normal after your baby is born. WHAT TO EXPECT AT YOUR PRENATAL VISITS During a routine prenatal visit:  You will be weighed to make sure you and the fetus are growing normally.  Your blood pressure will be taken.  Your abdomen will be measured to track your baby's growth.  The fetal heartbeat will be listened to.  Any test results from the previous visit will be discussed. Your health care provider may ask you:  How you are feeling.  If you are feeling the baby move.  If you have had any abnormal symptoms, such as leaking fluid, bleeding, severe headaches, or abdominal cramping.  If you have any questions. Other tests that may be performed during your second trimester include:  Blood tests that check for:  Low iron levels (anemia).  Gestational diabetes (between 24 and 28 weeks).  Rh antibodies.  Urine tests to check for infections, diabetes, or protein in the urine.  An ultrasound to confirm the proper growth and development of the baby.  An amniocentesis to check for possible genetic problems.  Fetal screens for spina bifida and Down syndrome. HOME CARE INSTRUCTIONS   Avoid all smoking, herbs, alcohol, and unprescribed   drugs. These chemicals affect the formation and growth of the baby.  Follow your health care provider's instructions regarding medicine use. There are medicines that are either safe or unsafe to take during pregnancy.  Exercise only as directed by your health care provider. Experiencing uterine cramps is a good sign to stop exercising.  Continue to eat regular, healthy meals.  Wear a good support bra for breast tenderness.  Do not use hot tubs, steam rooms, or saunas.  Wear your  seat belt at all times when driving.  Avoid raw meat, uncooked cheese, cat litter boxes, and soil used by cats. These carry germs that can cause birth defects in the baby.  Take your prenatal vitamins.  Try taking a stool softener (if your health care provider approves) if you develop constipation. Eat more high-fiber foods, such as fresh vegetables or fruit and whole grains. Drink plenty of fluids to keep your urine clear or pale yellow.  Take warm sitz baths to soothe any pain or discomfort caused by hemorrhoids. Use hemorrhoid cream if your health care provider approves.  If you develop varicose veins, wear support hose. Elevate your feet for 15 minutes, 3-4 times a day. Limit salt in your diet.  Avoid heavy lifting, wear low heel shoes, and practice good posture.  Rest with your legs elevated if you have leg cramps or low back pain.  Visit your dentist if you have not gone yet during your pregnancy. Use a soft toothbrush to brush your teeth and be gentle when you floss.  A sexual relationship may be continued unless your health care provider directs you otherwise.  Continue to go to all your prenatal visits as directed by your health care provider. SEEK MEDICAL CARE IF:   You have dizziness.  You have mild pelvic cramps, pelvic pressure, or nagging pain in the abdominal area.  You have persistent nausea, vomiting, or diarrhea.  You have a bad smelling vaginal discharge.  You have pain with urination. SEEK IMMEDIATE MEDICAL CARE IF:   You have a fever.  You are leaking fluid from your vagina.  You have spotting or bleeding from your vagina.  You have severe abdominal cramping or pain.  You have rapid weight gain or loss.  You have shortness of breath with chest pain.  You notice sudden or extreme swelling of your face, hands, ankles, feet, or legs.  You have not felt your baby move in over an hour.  You have severe headaches that do not go away with  medicine.  You have vision changes. Document Released: 04/01/2001 Document Revised: 04/12/2013 Document Reviewed: 06/08/2012 Exit[employer] Patient Information 2015 [employer]. This information is not intended to replace advice given to you by your health care provider. Make sure you discuss any questions you have with your health care provider.  
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
## 2015-01-10 LAB — CYTOLOGY - PAP

## 2015-01-11 LAB — URINE CULTURE

## 2015-01-19 LAB — CBC
HEMOGLOBIN: 12.5 g/dL (ref 11.1–15.9)
Hematocrit: 39.3 % (ref 34.0–46.6)
MCH: 30.9 pg (ref 26.6–33.0)
MCHC: 31.8 g/dL (ref 31.5–35.7)
MCV: 97 fL (ref 79–97)
PLATELETS: 170 10*3/uL (ref 150–379)
RBC: 4.05 x10E6/uL (ref 3.77–5.28)
RDW: 13.8 % (ref 12.3–15.4)
WBC: 9.4 10*3/uL (ref 3.4–10.8)

## 2015-01-19 LAB — SICKLE CELL SCREEN: Sickle Cell Screen: NEGATIVE

## 2015-01-19 LAB — AFP, QUAD SCREEN
DIA MOM VALUE: 1.59
DIA VALUE (EIA): 326.87 pg/mL
DSR (By Age)    1 IN: 842
DSR (SECOND TRIMESTER) 1 IN: 3254
GESTATIONAL AGE AFP: 18.6 wk
MATERNAL AGE AT EDD: 28.2 a
MSAFP Mom: 1.32
MSAFP: 77.4 ng/mL
MSHCG Mom: 1.79
MSHCG: 54235 m[IU]/mL
Osb Risk: 9058
T18 (By Age): 1:3281 {titer}
Test Results:: NEGATIVE
UE3 MOM: 1.04
UE3 VALUE: 1.76 ng/mL
WEIGHT: 120 [lb_av]

## 2015-01-19 LAB — HIV ANTIBODY (ROUTINE TESTING W REFLEX): HIV SCREEN 4TH GENERATION: NONREACTIVE

## 2015-01-19 LAB — PMP SCREEN PROFILE (10S), URINE
AMPHETAMINE SCRN UR: NEGATIVE ng/mL
BARBITURATE SCRN UR: NEGATIVE ng/mL
Benzodiazepine Screen, Urine: NEGATIVE ng/mL
COCAINE(METAB.) SCREEN, URINE: NEGATIVE ng/mL
Cannabinoids Ur Ql Scn: NEGATIVE ng/mL
Creatinine(Crt), U: 75.1 mg/dL (ref 20.0–300.0)
METHADONE SCREEN, URINE: NEGATIVE ng/mL
OPIATE SCRN UR: NEGATIVE ng/mL
Oxycodone+Oxymorphone Ur Ql Scn: NEGATIVE ng/mL
PCP Scrn, Ur: NEGATIVE ng/mL
PH UR, DRUG SCRN: 7.4 (ref 4.5–8.9)
Propoxyphene, Screen: NEGATIVE ng/mL

## 2015-01-19 LAB — ABO/RH: RH TYPE: POSITIVE

## 2015-01-19 LAB — URINALYSIS, ROUTINE W REFLEX MICROSCOPIC
Bilirubin, UA: NEGATIVE
GLUCOSE, UA: NEGATIVE
KETONES UA: NEGATIVE
Nitrite, UA: NEGATIVE
Protein, UA: NEGATIVE
RBC, UA: NEGATIVE
SPEC GRAV UA: 1.017 (ref 1.005–1.030)
UUROB: 1 mg/dL (ref 0.2–1.0)
pH, UA: 7.5 (ref 5.0–7.5)

## 2015-01-19 LAB — HEPATITIS B SURFACE ANTIGEN: HEP B S AG: NEGATIVE

## 2015-01-19 LAB — MICROSCOPIC EXAMINATION: Casts: NONE SEEN /lpf

## 2015-01-19 LAB — ANTIBODY SCREEN: ANTIBODY SCREEN: NEGATIVE

## 2015-01-19 LAB — RPR: RPR Ser Ql: NONREACTIVE

## 2015-01-19 LAB — RUBELLA SCREEN: RUBELLA: 2.8 {index} (ref >0.99)

## 2015-01-19 LAB — VARICELLA ZOSTER ANTIBODY, IGG: Varicella zoster IgG: 1619 index (ref >165)

## 2015-01-25 ENCOUNTER — Encounter: Payer: Self-pay | Admitting: Obstetrics and Gynecology

## 2015-01-25 ENCOUNTER — Ambulatory Visit (INDEPENDENT_AMBULATORY_CARE_PROVIDER_SITE_OTHER): Payer: Medicaid Other

## 2015-01-25 ENCOUNTER — Ambulatory Visit (INDEPENDENT_AMBULATORY_CARE_PROVIDER_SITE_OTHER): Payer: Medicaid Other | Admitting: Obstetrics and Gynecology

## 2015-01-25 VITALS — BP 110/62 | HR 80 | Wt 123.0 lb

## 2015-01-25 DIAGNOSIS — Z363 Encounter for antenatal screening for malformations: Secondary | ICD-10-CM

## 2015-01-25 DIAGNOSIS — Z3A20 20 weeks gestation of pregnancy: Secondary | ICD-10-CM

## 2015-01-25 DIAGNOSIS — Z3492 Encounter for supervision of normal pregnancy, unspecified, second trimester: Secondary | ICD-10-CM

## 2015-01-25 DIAGNOSIS — Z331 Pregnant state, incidental: Secondary | ICD-10-CM

## 2015-01-25 DIAGNOSIS — Z36 Encounter for antenatal screening of mother: Secondary | ICD-10-CM

## 2015-01-25 DIAGNOSIS — Z1389 Encounter for screening for other disorder: Secondary | ICD-10-CM

## 2015-01-25 LAB — POCT URINALYSIS DIPSTICK
Blood, UA: NEGATIVE
Glucose, UA: NEGATIVE
Ketones, UA: NEGATIVE
LEUKOCYTES UA: NEGATIVE
NITRITE UA: NEGATIVE
Protein, UA: NEGATIVE

## 2015-01-25 NOTE — Progress Notes (Signed)
[place] 20+6wks,measurements c/w dates,efw 370g,normal ov's bilat,cephalic,post pl gr 0,cx 3.5cm,svp of fluid 5.9cm,fhr 139bpm,anatomy complete no obvious abn seen

## 2015-01-25 NOTE — Progress Notes (Signed)
Pt denies any problems or concerns at this time.  

## 2015-01-25 NOTE — Progress Notes (Signed)
Patient ID: [name], female   DOB: June 25, 1985, 29 y.o.   MRN: [id number]  [id number] [redacted]w[redacted]d Estimated Date of Delivery: 06/08/15  Blood pressure 110/62, pulse 80, weight 123 lb (55.792 kg), last menstrual period 09/01/2014.   refer to the ob flow sheet for FH and FHR, also BP, Wt, Urine results:notable for negative  Patient reports too early for fetal movement, denies any bleeding and no rupture of membranes symptoms or regular contractions. Patient complaints: None.  Questions were answered. Assessment: Pregnancy [redacted]w[redacted]d, LROB   U/S done today; results reviewed with pt. Plan:  Continued routine obstetrical care,   F/u in 4 weeks for pnx care    By signing my name below, I, Ronney Lion, attest that this documentation has been prepared under the direction and in the presence of Tilda Burrow, MD. Electronically Signed: Ronney Lion, ED Scribe. 01/25/2015. 10:43 AM.  I personally performed the services described in this documentation, which was SCRIBED in my presence. The recorded information has been reviewed and considered accurate. It has been edited as necessary during review. Tilda Burrow, MD

## 2015-02-22 ENCOUNTER — Ambulatory Visit (INDEPENDENT_AMBULATORY_CARE_PROVIDER_SITE_OTHER): Payer: Medicaid Other | Admitting: Advanced Practice Midwife

## 2015-02-22 ENCOUNTER — Encounter: Payer: Self-pay | Admitting: Advanced Practice Midwife

## 2015-02-22 VITALS — BP 100/64 | HR 68 | Wt 133.0 lb

## 2015-02-22 DIAGNOSIS — Z1389 Encounter for screening for other disorder: Secondary | ICD-10-CM

## 2015-02-22 DIAGNOSIS — Z331 Pregnant state, incidental: Secondary | ICD-10-CM

## 2015-02-22 DIAGNOSIS — Z3492 Encounter for supervision of normal pregnancy, unspecified, second trimester: Secondary | ICD-10-CM

## 2015-02-22 LAB — POCT URINALYSIS DIPSTICK
Blood, UA: NEGATIVE
GLUCOSE UA: NEGATIVE
Ketones, UA: NEGATIVE
Leukocytes, UA: NEGATIVE
NITRITE UA: NEGATIVE
PROTEIN UA: NEGATIVE

## 2015-02-22 NOTE — Patient Instructions (Signed)

## 2015-02-22 NOTE — Progress Notes (Addendum)
Pt denies any problems or concerns at this time.  

## 2015-02-22 NOTE — Progress Notes (Addendum)
[id number] Estimated Date of Delivery: 06/08/15  Blood pressure 100/64, pulse 68, weight 133 lb (60.328 kg), last menstrual period 09/01/2014.   BP weight and urine results all reviewed and noted.  Please refer to the obstetrical flow sheet for the fundal height and fetal heart rate documentation:  Patient reports good fetal movement, denies any bleeding and no rupture of membranes symptoms or regular contractions. Patient is without complaints.wants CfDNA (was too late for NT/IT and then "dates got messed up" and didn't get AFP.   All questions were answered.  Orders Placed This Encounter  Procedures  . POCT urinalysis dipstick    Plan:  Continued routine obstetrical care,   Return in about 3 weeks (around 03/15/2015) for PN2/LROB.

## 2015-03-13 ENCOUNTER — Ambulatory Visit (INDEPENDENT_AMBULATORY_CARE_PROVIDER_SITE_OTHER): Payer: Medicaid Other | Admitting: Advanced Practice Midwife

## 2015-03-13 ENCOUNTER — Other Ambulatory Visit: Payer: Medicaid Other

## 2015-03-13 ENCOUNTER — Encounter: Payer: Self-pay | Admitting: Advanced Practice Midwife

## 2015-03-13 VITALS — BP 100/60 | HR 80 | Wt 140.0 lb

## 2015-03-13 DIAGNOSIS — Z3492 Encounter for supervision of normal pregnancy, unspecified, second trimester: Secondary | ICD-10-CM

## 2015-03-13 DIAGNOSIS — Z1389 Encounter for screening for other disorder: Secondary | ICD-10-CM

## 2015-03-13 DIAGNOSIS — Z369 Encounter for antenatal screening, unspecified: Secondary | ICD-10-CM

## 2015-03-13 DIAGNOSIS — Z131 Encounter for screening for diabetes mellitus: Secondary | ICD-10-CM

## 2015-03-13 DIAGNOSIS — Z331 Pregnant state, incidental: Secondary | ICD-10-CM

## 2015-03-13 LAB — POCT URINALYSIS DIPSTICK
GLUCOSE UA: NEGATIVE
KETONES UA: NEGATIVE
Leukocytes, UA: NEGATIVE
Nitrite, UA: NEGATIVE
Protein, UA: NEGATIVE
RBC UA: NEGATIVE

## 2015-03-13 NOTE — Progress Notes (Signed)
Pt denies any problems or concerns at this time.  

## 2015-03-13 NOTE — Progress Notes (Signed)
[id number] [id number] Estimated Date of Delivery: 06/08/15  Blood pressure 100/60, pulse 80, weight 140 lb (63.504 kg), last menstrual period 09/01/2014.   BP weight and urine results all reviewed and noted.  Please refer to the obstetrical flow sheet for the fundal height and fetal heart rate documentation:  Patient reports good fetal movement, denies any bleeding and no rupture of membranes symptoms or regular contractions. Patient is without complaints. All questions were answered.  Orders Placed This Encounter  Procedures  . POCT urinalysis dipstick    Plan:  Continued routine obstetrical care, PN2 today.  Doing cfDNA (there was a mix up with AFP, now too late)  Return in about 3 weeks (around 04/03/2015) for LROB.

## 2015-03-14 LAB — CBC
HEMATOCRIT: 32.7 % — AB (ref 34.0–46.6)
Hemoglobin: 11.1 g/dL (ref 11.1–15.9)
MCH: 31.8 pg (ref 26.6–33.0)
MCHC: 33.9 g/dL (ref 31.5–35.7)
MCV: 94 fL (ref 79–97)
Platelets: 143 10*3/uL — ABNORMAL LOW (ref 150–379)
RBC: 3.49 x10E6/uL — AB (ref 3.77–5.28)
RDW: 13 % (ref 12.3–15.4)
WBC: 12 10*3/uL — AB (ref 3.4–10.8)

## 2015-03-14 LAB — GLUCOSE TOLERANCE, 2 HOURS W/ 1HR
GLUCOSE, 1 HOUR: 159 mg/dL (ref 65–179)
GLUCOSE, 2 HOUR: 93 mg/dL (ref 65–152)
GLUCOSE, FASTING: 73 mg/dL (ref 65–91)

## 2015-03-14 LAB — RPR: RPR Ser Ql: NONREACTIVE

## 2015-03-14 LAB — ANTIBODY SCREEN: ANTIBODY SCREEN: NEGATIVE

## 2015-03-14 LAB — HIV ANTIBODY (ROUTINE TESTING W REFLEX): HIV Screen 4th Generation wRfx: NONREACTIVE

## 2015-03-19 LAB — INFORMASEQ(SM) WITH XY ANALYSIS
FETAL FRACTION (%): 25.1
GESTATIONAL AGE AT COLLECTION: 26.5 wk

## 2015-03-20 NOTE — Progress Notes (Signed)
I now see where AFP was done at first [facility]--I somehow missed it when I ordered CfDNA.  All genetic screens negative.  CRESENZO-DISHMAN,Kammy Klett

## 2015-04-03 ENCOUNTER — Encounter: Payer: Medicaid Other | Admitting: Women's Health

## 2015-04-11 ENCOUNTER — Encounter: Payer: Medicaid Other | Admitting: Women's Health

## 2015-04-11 ENCOUNTER — Encounter: Payer: Medicaid Other | Admitting: Obstetrics and Gynecology

## 2015-04-12 ENCOUNTER — Encounter: Payer: Self-pay | Admitting: Obstetrics & Gynecology

## 2015-04-12 ENCOUNTER — Ambulatory Visit (INDEPENDENT_AMBULATORY_CARE_PROVIDER_SITE_OTHER): Payer: Medicaid Other | Admitting: Obstetrics & Gynecology

## 2015-04-12 VITALS — BP 100/60 | HR 94 | Wt 143.0 lb

## 2015-04-12 DIAGNOSIS — Z1389 Encounter for screening for other disorder: Secondary | ICD-10-CM

## 2015-04-12 DIAGNOSIS — Z3493 Encounter for supervision of normal pregnancy, unspecified, third trimester: Secondary | ICD-10-CM

## 2015-04-12 DIAGNOSIS — Z331 Pregnant state, incidental: Secondary | ICD-10-CM

## 2015-04-12 LAB — POCT URINALYSIS DIPSTICK
Glucose, UA: NEGATIVE
Ketones, UA: NEGATIVE
NITRITE UA: NEGATIVE
PROTEIN UA: NEGATIVE
RBC UA: NEGATIVE

## 2015-04-12 NOTE — Progress Notes (Signed)
[id number] 6489w6d Estimated Date of Delivery: 06/08/15  Blood pressure 100/60, pulse 94, weight 143 lb (64.864 kg), last menstrual period 09/01/2014.   BP weight and urine results all reviewed and noted.  Please refer to the obstetrical flow sheet for the fundal height and fetal heart rate documentation:  Patient reports good fetal movement, denies any bleeding and no rupture of membranes symptoms or regular contractions. Patient is without complaints. All questions were answered.  Orders Placed This Encounter  Procedures  . POCT urinalysis dipstick    Plan:  Continued routine obstetrical care, FH lagging a bit will check sonogram EFW  No Follow-up on file.

## 2015-04-14 ENCOUNTER — Inpatient Hospital Stay (HOSPITAL_COMMUNITY)
Admission: EM | Admit: 2015-04-14 | Discharge: 2015-04-17 | DRG: 780 | Disposition: A | Discharge status: Home / Self Care | Payer: Medicaid Other | Attending: Obstetrics and Gynecology | Admitting: Obstetrics and Gynecology

## 2015-04-14 ENCOUNTER — Encounter (HOSPITAL_COMMUNITY): Payer: Self-pay | Admitting: Emergency Medicine

## 2015-04-14 DIAGNOSIS — A5901 Trichomonal vulvovaginitis: Secondary | ICD-10-CM

## 2015-04-14 DIAGNOSIS — O4703 False labor before 37 completed weeks of gestation, third trimester: Principal | ICD-10-CM | POA: Diagnosis present

## 2015-04-14 DIAGNOSIS — O4700 False labor before 37 completed weeks of gestation, unspecified trimester: Secondary | ICD-10-CM

## 2015-04-14 DIAGNOSIS — Z3A32 32 weeks gestation of pregnancy: Secondary | ICD-10-CM

## 2015-04-14 MED ORDER — SODIUM CHLORIDE 0.9 % IV SOLN
INTRAVENOUS | Status: AC
Start: 2015-04-14 — End: 2015-04-15
  Administered 2015-04-14: via INTRAVENOUS

## 2015-04-14 NOTE — ED Provider Notes (Signed)
CSN: [id number]     Arrival date & time 04/14/15  2313 History   First MD Initiated Contact with Patient 04/14/15 2317     Chief Complaint  Patient presents with  . Abdominal Pain     (Consider location/radiation/quality/duration/timing/severity/associated sxs/prior Treatment) HPI Ashley Simon is a 29 y.o. [phone] @ [redacted]w[redacted]d gestation who presents to the ED with abdominal pain. Note from Dr. Despina Hidden 04/12/15 states patient without abdominal pain or leaking of fluid. Tonight patient reports abdominal pain that she states has been off and on for a week but much worse tonight. She also reports a white vaginal d/c that started 2 weeks ago. She is scheduled for an ultrasound 04/26/15. She also reports that she has been leaking fluid.   Past Medical History  Diagnosis Date  . Asthma   . Depression   . Anxiety   . Migraine   . Hx of chlamydia infection   . Abnormal Pap smear   . BV (bacterial vaginosis) 09/07/2012  . Vaginal discharge 12/01/2012    +clue cells will rx metrogel and get GC/CHL   Past Surgical History  Procedure Laterality Date  . No past surgeries     Family History  Problem Relation Age of Onset  . Other Neg Hx   . Hypertension Paternal Grandfather   . Diabetes Paternal Grandfather   . Hypertension Paternal Grandmother   . Diabetes Paternal Grandmother   . Cancer Paternal Grandmother     breast  . Hypertension Maternal Grandmother   . Coronary artery disease Maternal Grandfather   . Hypertension Maternal Grandfather   . Cancer Maternal Grandfather     colon  . Heart disease Maternal Grandfather   . Diabetes Father   . Asthma Mother   . Cancer Mother     breast  . Cancer Maternal Aunt     breast and ovarian   Social History  Substance Use Topics  . Smoking status: Former Smoker -- 0.25 packs/day for .5 years    Types: Cigarettes  . Smokeless tobacco: Never Used  . Alcohol Use: No   OB History    Gravida Para Term Preterm AB TAB SAB Ectopic Multiple Living    Review of Systems  Gastrointestinal: Positive for abdominal pain.  Genitourinary: Positive for vaginal discharge.       Pregnancy  all other systems negative    Allergies  Calcium-containing compounds  Home Medications   Prior to Admission medications   Medication Sig Start Date End Date Taking? Authorizing Provider  albuterol (PROVENTIL HFA;VENTOLIN HFA) 108 (90 BASE) MCG/ACT inhaler Inhale 1-2 puffs into the lungs every 6 (six) hours as needed for wheezing or shortness of breath. For shortness of breath Patient not taking: Reported on 04/12/2015 01/09/15   Cheral Marker, CNM  Prenat w/o A-FeCbGl-DSS-FA-DHA (CITRANATAL ASSURE) 35-1 & 300 MG tablet One tablet and one capsule daily 01/09/15   Cheral Marker, CNM   BP 103/58 mmHg  Pulse 99  Temp(Src) 98 F (36.7 C) (Oral)  Resp 18  Ht  (1.575 m)  Wt 66.679 kg  BMI 26.88 kg/m2  SpO2 99%  LMP 09/01/2014 Physical Exam  Constitutional: She is oriented to person, place, and time. She appears well-developed and well-nourished. No distress.  HENT:  Head: Normocephalic.  Eyes: EOM are normal.  Neck: Normal range of motion. Neck supple.  Cardiovascular: Normal rate and regular rhythm.   Pulmonary/Chest: Effort  normal and breath sounds normal.  Abdominal: Soft. Bowel sounds are normal. There is tenderness.  Contracting every 3 to 5 minutes  Genitourinary:  External genitalia without lesions, yellow d/c vaginal vault, cervix 2-3 cm, vertex, ballottable.   Musculoskeletal: Normal range of motion.  Neurological: She is alert and oriented to person, place, and time. No cranial nerve deficit.  Skin: Skin is warm and dry.  Psychiatric: She has a normal mood and affect. Her behavior is normal.  Nursing note and vitals reviewed.   ED Course  Procedures (including critical care time)  IV fluid bolus, cath ua, EFM, @ 12 MN consult with [facility]stant [facility] and will start Mag 4 grams then 2  grams/hr loading dose and Betamethasone 12 mg. [facility]   Labs Review Results for orders placed or performed during the hospital encounter of 04/14/15 (from the past 24 hour(s))  Wet prep, genital     Status: Abnormal   Collection Time: 04/14/15 11:56 PM  Result Value Ref Range   Yeast Wet Prep HPF POC NONE SEEN NONE SEEN   Trich, Wet Prep PRESENT (A) NONE SEEN   Clue Cells Wet Prep HPF POC NONE SEEN NONE SEEN   WBC, Wet Prep HPF POC MODERATE (A) NONE SEEN   Sperm NONE SEEN   Urinalysis, Routine w reflex microscopic (not at [facility])     Status: Abnormal   Collection Time: 04/15/15 12:04 AM  Result Value Ref Range   Color, Urine YELLOW YELLOW   APPearance CLEAR CLEAR   Specific Gravity, Urine 1.020 1.005 - 1.030   pH 7.0 5.0 - 8.0   Glucose, UA NEGATIVE NEGATIVE mg/dL   Hgb urine dipstick TRACE (A) NEGATIVE   Bilirubin Urine NEGATIVE NEGATIVE   Ketones, ur TRACE (A) NEGATIVE mg/dL   Protein, ur TRACE (A) NEGATIVE mg/dL   Nitrite NEGATIVE NEGATIVE   Leukocytes, UA SMALL (A) NEGATIVE  Urine microscopic-add on     Status: Abnormal   Collection Time: 04/15/15 12:04 AM  Result Value Ref Range   Squamous Epithelial / LPF 0-5 (A) NONE SEEN   WBC, UA 6-30 0 - 5 WBC/hpf   RBC / HPF 6-30 0 - 5 RBC/hpf   Bacteria, UA FEW (A) NONE SEEN     MDM  29 y.o. [id number] @ 541w2d gestation with abdominal pain contracting every 3 to 5 minutes. [facility] to L&D to continue monitoring and further evaluation by OB. I discussed plan of care with the patient and she agrees with plan.   Final diagnoses:  Preterm labor without delivery  Trichomonas vaginalis (TV) infection       [facility] Tien Aispuro, NP 04/15/15 [id number]  Devoria AlbeIva Knapp, MD 04/15/15 [phone]

## 2015-04-14 NOTE — ED Notes (Signed)
Pt c/o abd pain that worse today and c/o white discharge x 2 weeks. Pt states she is scheduled for an emergency ultrasound 04/26/15.

## 2015-04-14 NOTE — Progress Notes (Signed)
[facility] notified about patient arrival to [facility] ED c/o right abdominal pain; patient is 32 1/7 weeks; G4P3; patient receives [facility] and was seen 2 days ago in the office and a ultrasound was scheduled for January 5th per patient; patient states she has had white vaginal discharge for 2 weeks by denies bleeding or leaking of fluid at this time; EFM applied and contractions noted; Lawson FiscalLori, RN taking care of patient called and notified of contractions and given faculty practice attending number for further instruction

## 2015-04-14 NOTE — ED Notes (Signed)
Pt is contracting 3-5 per minute Baby looks good. Contact Dr. Jolayne Pantheronstant at [phone].

## 2015-04-15 DIAGNOSIS — O4703 False labor before 37 completed weeks of gestation, third trimester: Secondary | ICD-10-CM | POA: Diagnosis present

## 2015-04-15 DIAGNOSIS — Z3A32 32 weeks gestation of pregnancy: Secondary | ICD-10-CM | POA: Diagnosis not present

## 2015-04-15 DIAGNOSIS — O4700 False labor before 37 completed weeks of gestation, unspecified trimester: Secondary | ICD-10-CM

## 2015-04-15 LAB — CBC
HCT: 32.1 % — ABNORMAL LOW (ref 36.0–46.0)
Hemoglobin: 10.3 g/dL — ABNORMAL LOW (ref 12.0–15.0)
MCH: 29.9 pg (ref 26.0–34.0)
MCHC: 32.1 g/dL (ref 30.0–36.0)
MCV: 93 fL (ref 78.0–100.0)
PLATELETS: 128 10*3/uL — AB (ref 150–400)
RBC: 3.45 MIL/uL — ABNORMAL LOW (ref 3.87–5.11)
RDW: 14.1 % (ref 11.5–15.5)
WBC: 13.7 10*3/uL — ABNORMAL HIGH (ref 4.0–10.5)

## 2015-04-15 LAB — URINALYSIS, ROUTINE W REFLEX MICROSCOPIC
Bilirubin Urine: NEGATIVE
Glucose, UA: NEGATIVE mg/dL
NITRITE: NEGATIVE
SPECIFIC GRAVITY, URINE: 1.02 (ref 1.005–1.030)
pH: 7 (ref 5.0–8.0)

## 2015-04-15 LAB — TYPE AND SCREEN
ABO/RH(D): O POS
Antibody Screen: NEGATIVE

## 2015-04-15 LAB — HIV ANTIBODY (ROUTINE TESTING W REFLEX): HIV SCREEN 4TH GENERATION: NONREACTIVE

## 2015-04-15 LAB — URINE MICROSCOPIC-ADD ON

## 2015-04-15 LAB — WET PREP, GENITAL
Clue Cells Wet Prep HPF POC: NONE SEEN
SPERM: NONE SEEN
YEAST WET PREP: NONE SEEN

## 2015-04-15 LAB — HEPATITIS B SURFACE ANTIGEN: Hepatitis B Surface Ag: NEGATIVE

## 2015-04-15 LAB — AMNISURE RUPTURE OF MEMBRANE (ROM) NOT AT ARMC: AMNISURE: NEGATIVE

## 2015-04-15 LAB — ABO/RH: ABO/RH(D): O POS

## 2015-04-15 MED ORDER — BETAMETHASONE SOD PHOS & ACET 6 (3-3) MG/ML IJ SUSP
12.0000 mg | Freq: Once | INTRAMUSCULAR | Status: AC
  Administered 2015-04-15: 12 mg via INTRAMUSCULAR
  Filled 2015-04-15: qty 2

## 2015-04-15 MED ORDER — PENICILLIN G POTASSIUM 5000000 UNITS IJ SOLR
2.5000 10*6.[IU] | INTRAMUSCULAR | Status: DC
  Administered 2015-04-15: 2.5 10*6.[IU] via INTRAVENOUS
  Filled 2015-04-15 (×3): qty 2.5

## 2015-04-15 MED ORDER — MAGNESIUM SULFATE 40 G IN LACTATED RINGERS - SIMPLE
2.0000 g/h | INTRAVENOUS | Status: DC
  Administered 2015-04-15 (×3): 2 g/h via INTRAVENOUS
  Filled 2015-04-15 (×2): qty 500

## 2015-04-15 MED ORDER — LACTATED RINGERS IV SOLN
INTRAVENOUS | Status: AC
  Administered 2015-04-15: 01:00:00 via INTRAVENOUS

## 2015-04-15 MED ORDER — BETAMETHASONE SOD PHOS & ACET 6 (3-3) MG/ML IJ SUSP
12.0000 mg | Freq: Once | INTRAMUSCULAR | Status: AC
  Administered 2015-04-16: 12 mg via INTRAMUSCULAR
  Filled 2015-04-15: qty 2

## 2015-04-15 MED ORDER — PENICILLIN G POTASSIUM 5000000 UNITS IJ SOLR
5.0000 10*6.[IU] | Freq: Once | INTRAVENOUS | Status: AC
  Administered 2015-04-15: 5 10*6.[IU] via INTRAVENOUS
  Filled 2015-04-15: qty 5

## 2015-04-15 MED ORDER — MAGNESIUM SULFATE 4 GM/100ML IV SOLN: 4.0000 g | Freq: Once | INTRAVENOUS | Status: DC

## 2015-04-15 MED ORDER — ALBUTEROL SULFATE (2.5 MG/3ML) 0.083% IN NEBU: 3.0000 mL | INHALATION_SOLUTION | Freq: Four times a day (QID) | RESPIRATORY_TRACT | Status: DC | PRN

## 2015-04-15 MED ORDER — DOCUSATE SODIUM 100 MG PO CAPS
100.0000 mg | ORAL_CAPSULE | Freq: Every day | ORAL | Status: DC
  Administered 2015-04-16 – 2015-04-17 (×2): 100 mg via ORAL
  Filled 2015-04-15 (×4): qty 1

## 2015-04-15 MED ORDER — METRONIDAZOLE 500 MG PO TABS
2000.0000 mg | ORAL_TABLET | Freq: Once | ORAL | Status: AC
  Administered 2015-04-15: 2000 mg via ORAL
  Filled 2015-04-15: qty 4

## 2015-04-15 MED ORDER — ACETAMINOPHEN 325 MG PO TABS
650.0000 mg | ORAL_TABLET | ORAL | Status: DC | PRN
  Administered 2015-04-15 – 2015-04-17 (×6): 650 mg via ORAL
  Filled 2015-04-15 (×6): qty 2

## 2015-04-15 MED ORDER — PRENATAL MULTIVITAMIN CH
1.0000 | ORAL_TABLET | Freq: Every day | ORAL | Status: DC
  Administered 2015-04-17: 1 via ORAL
  Filled 2015-04-15 (×2): qty 1

## 2015-04-15 MED ORDER — CALCIUM CARBONATE ANTACID 500 MG PO CHEW
2.0000 | CHEWABLE_TABLET | ORAL | Status: DC | PRN
  Administered 2015-04-15: 400 mg via ORAL
  Filled 2015-04-15: qty 2

## 2015-04-15 MED ORDER — MAGNESIUM SULFATE BOLUS VIA INFUSION
4.0000 g | Freq: Once | INTRAVENOUS | Status: AC
  Administered 2015-04-15: 4 g via INTRAVENOUS
  Filled 2015-04-15: qty 500

## 2015-04-15 MED ORDER — ZOLPIDEM TARTRATE 5 MG PO TABS
5.0000 mg | ORAL_TABLET | Freq: Every evening | ORAL | Status: DC | PRN
Start: 2015-04-15 — End: 2015-04-17
  Administered 2015-04-16: 5 mg via ORAL
  Filled 2015-04-15: qty 1

## 2015-04-15 MED ORDER — FAMOTIDINE 20 MG PO TABS
20.0000 mg | ORAL_TABLET | Freq: Two times a day (BID) | ORAL | Status: DC
  Administered 2015-04-15 – 2015-04-17 (×5): 20 mg via ORAL
  Filled 2015-04-15 (×5): qty 1

## 2015-04-15 MED ORDER — LACTATED RINGERS IV SOLN
INTRAVENOUS | Status: DC
  Administered 2015-04-15 – 2015-04-16 (×3): via INTRAVENOUS

## 2015-04-15 MED ORDER — LACTATED RINGERS IV SOLN
INTRAVENOUS | Status: DC
  Administered 2015-04-15: 03:00:00 via INTRAVENOUS

## 2015-04-15 NOTE — H&P (Signed)
ANTEPARTUM ADMISSION HISTORY AND PHYSICAL NOTE   History of Present Illness: Ashley Simon is a 29 y.o. G4P3003 at [redacted]w[redacted]d admitted for contractions.  Present on and off for approximately one week, stronger past 3 days, currently every 5 to 10 minutes. Presented to outside ED this evening and found to be 2 cm dilated and contracting painfully.  Positive fetal movement, no bleeding. Having a few days of discharge, thinks may be fluid.   Patient Active Problem List   Diagnosis Date Noted  . Threatened preterm labor 04/15/2015  . Supervision of normal pregnancy 01/09/2015  . Smoker 01/09/2015  . H/O Depression with anxiety 01/09/2015  . Vaginal discharge 12/01/2012  . BV (bacterial vaginosis) 09/07/2012  . Asthma 09/06/2012  . Hx of chlamydia infection 09/06/2012  . Abnormal pap 09/06/2012  . Post partum depression 09/06/2012    Past Medical History  Diagnosis Date  . Asthma   . Depression   . Anxiety   . Migraine   . Hx of chlamydia infection   . Abnormal Pap smear   . BV (bacterial vaginosis) 09/07/2012  . Vaginal discharge 12/01/2012    +clue cells will rx metrogel and get GC/CHL    Past Surgical History  Procedure Laterality Date  . No past surgeries      OB History  Gravida Para Term Preterm AB SAB TAB Ectopic Multiple Living  # Outcome Date GA Lbr Len/2nd Weight Sex Delivery Anes PTL Lv  4 Current           3 Term 05/12/12 [redacted]w[redacted]d 09:56 / 01:45 6 lb 11 oz (3.033 kg) F Vag-Vacuum EPI N Y  2 Term 03/23/08 [redacted]w[redacted]d  6 lb 11 oz (3.033 kg) M Vag-Spont EPI N Y  1 Term 05/14/07 [redacted]w[redacted]d  7 lb (3.175 kg) F Vag-Spont EPI N Y      Social History   Social History  . Marital Status: Single    Spouse Name: N/A  . Number of Children: N/A  . Years of Education: N/A   Social History Main Topics  . Smoking status: Former Smoker -- 0.25 packs/day for .5 years    Types: Cigarettes  . Smokeless tobacco: Never Used  . Alcohol Use: No  . Drug Use: No  . Sexual  Activity: Not Currently   Other Topics Concern  . None   Social History Narrative    Family History  Problem Relation Age of Onset  . Other Neg Hx   . Hypertension Paternal Grandfather   . Diabetes Paternal Grandfather   . Hypertension Paternal Grandmother   . Diabetes Paternal Grandmother   . Cancer Paternal Grandmother     breast  . Hypertension Maternal Grandmother   . Coronary artery disease Maternal Grandfather   . Hypertension Maternal Grandfather   . Cancer Maternal Grandfather     colon  . Heart disease Maternal Grandfather   . Diabetes Father   . Asthma Mother   . Cancer Mother     breast  . Cancer Maternal Aunt     breast and ovarian    Allergies  Allergen Reactions  . Calcium-Containing Compounds Nausea And Vomiting    Prescriptions prior to admission  Medication Sig Dispense Refill Last Dose  . albuterol (PROVENTIL HFA;VENTOLIN HFA) 108 (90 BASE) MCG/ACT inhaler Inhale 1-2 puffs into the lungs every 6 (six) hours as needed for wheezing or shortness of breath. For shortness of breath (Patient  not taking: Reported on 04/12/2015) 1 Inhaler 0 Not Taking  . Prenat w/o A-FeCbGl-DSS-FA-DHA (CITRANATAL ASSURE) 35-1 & 300 MG tablet One tablet and one capsule daily 60 tablet 11 Taking    Review of Systems - Negative except as per hpi  Vitals:  BP 103/60 mmHg  Pulse 84  Temp(Src) 98.5 F (36.9 C) (Oral)  Resp 18  Ht 5\' 2"  (1.575 m)  Wt 147 lb (66.679 kg)  BMI 26.88 kg/m2  SpO2 99%  LMP 09/01/2014 Physical Examination: CONSTITUTIONAL: Well-developed, well-nourished female in no acute distress.  HENT:  Normocephalic, atraumatic, External right and left ear normal. Oropharynx is clear and moist EYES: Conjunctivae and EOM are normal. Pupils are equal, round, and reactive to light. No scleral icterus.  NECK: Normal range of motion, supple, no masses SKIN: Skin is warm and dry. No rash noted. Not diaphoretic. No erythema. No pallor. NEUROLGIC: Alert and oriented  to person, place, and time. Normal reflexes, muscle tone coordination. No cranial nerve deficit noted. PSYCHIATRIC: Normal mood and affect. Normal behavior. Normal judgment and thought content. CARDIOVASCULAR: Normal heart rate noted, regular rhythm RESPIRATORY: Effort and breath sounds normal, no problems with respiration noted ABDOMEN: Soft, nontender, nondistended, gravid. MUSCULOSKELETAL: Normal range of motion. No edema and no tenderness. 2+ distal pulses.  Cervix: 3/30/-3, vertex Membranes: appear to be intact Fetal Monitoring: 140/mod/+a/-d Tocometer: q ~10 minutes  Labs:  Results for orders placed or performed during the hospital encounter of 04/14/15 (from the past 24 hour(s))  Wet prep, genital   Collection Time: 04/14/15 11:56 PM  Result Value Ref Range   Yeast Wet Prep HPF POC NONE SEEN NONE SEEN   Trich, Wet Prep PRESENT (A) NONE SEEN   Clue Cells Wet Prep HPF POC NONE SEEN NONE SEEN   WBC, Wet Prep HPF POC MODERATE (A) NONE SEEN   Sperm NONE SEEN   Urinalysis, Routine w reflex microscopic (not at [facility])   Collection Time: 04/15/15 12:04 AM  Result Value Ref Range   Color, Urine YELLOW YELLOW   APPearance CLEAR CLEAR   Specific Gravity, Urine 1.020 1.005 - 1.030   pH 7.0 5.0 - 8.0   Glucose, UA NEGATIVE NEGATIVE mg/dL   Hgb urine dipstick TRACE (A) NEGATIVE   Bilirubin Urine NEGATIVE NEGATIVE   Ketones, ur TRACE (A) NEGATIVE mg/dL   Protein, ur TRACE (A) NEGATIVE mg/dL   Nitrite NEGATIVE NEGATIVE   Leukocytes, UA SMALL (A) NEGATIVE  Urine microscopic-add on   Collection Time: 04/15/15 12:04 AM  Result Value Ref Range   Squamous Epithelial / LPF 0-5 (A) NONE SEEN   WBC, UA 6-30 0 - 5 WBC/hpf   RBC / HPF 6-30 0 - 5 RBC/hpf   Bacteria, UA FEW (A) NONE SEEN    Imaging Studies: No results found.   Assessment and Plan: Patient Active Problem List   Diagnosis Date Noted  . Threatened preterm labor 04/15/2015  . Supervision of normal pregnancy 01/09/2015  .  Smoker 01/09/2015  . H/O Depression with anxiety 01/09/2015  . Vaginal discharge 12/01/2012  . BV (bacterial vaginosis) 09/07/2012  . Asthma 09/06/2012  . Hx of chlamydia infection 09/06/2012  . Abnormal pap 09/06/2012  . Post partum depression 09/06/2012   # Threatened preterm labor: cervix 3 cm dilated, painful contractions q 10 minutes - bmz now and in 24 hours - Mg for tocolysis - PCN for GBS ppx; gbs culture obtained - amnisure to rule-out rupture  # Trichomonas - seen in wet prep at outside  ED - patient informed this is an std and partner needs treatment and other std testing - g/c collected, also ordering hiv, hep b, and rprp - metronidazole 2 g po once  Silvano Bilis, MD OB/GYN Fellow

## 2015-04-15 NOTE — ED Provider Notes (Signed)
By signing my name below, I, Emmanuella Mensah, attest that this documentation has been prepared under the direction and in the presence of Devoria AlbeIva Shalini Mair, MD. Electronically Signed: Angelene GiovanniEmmanuella Mensah, ED Scribe. 04/15/2015. 12:15 AM.   HPI Comments: Ashley Simon is a 29 y.o. female , G4, P3 ABo, [redacted] weeks pregnant [facility] at [facility], who presents to the Emergency Department complaining of intermittent abdominal pain that lasts for approx 10 minutes onset 3 days ago. She states that she saw her OB yesterday but did not mention her abdominal pain because she states "I'm not ready for this baby to be born now." I pointed out if she had mentioned it they could have slowed or delayed her labor, she states "thank you, I didn't know that".    PT has abdomen c/w dates, she is on the fetal monitor. Pelvic exam done by NP Neese.    Medical screening examination/treatment/procedure(s) were conducted as a shared visit with non-physician practitioner(s) and myself.  I personally evaluated the patient during the encounter.   EKG Interpretation None       Devoria AlbeIva Stony Stegmann, MD, Concha PyoFACEP    Winter Jocelyn, MD 04/15/15 [phone]

## 2015-04-15 NOTE — Progress Notes (Signed)
Dr Jolayne Pantheronstant with faculty practice called [facility] to notified that patient will be transferred to L&D for magnesium sulfate and steroid prophylaxis
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
## 2015-04-16 LAB — CULTURE, OB URINE

## 2015-04-16 MED ORDER — SODIUM CHLORIDE 0.9 % IJ SOLN: 3.0000 mL | Freq: Two times a day (BID) | INTRAMUSCULAR | Status: DC

## 2015-04-16 MED ORDER — PROMETHAZINE HCL 25 MG PO TABS
25.0000 mg | ORAL_TABLET | Freq: Four times a day (QID) | ORAL | Status: DC | PRN
  Administered 2015-04-16: 25 mg via ORAL
  Filled 2015-04-16: qty 1

## 2015-04-16 MED ORDER — POLYETHYLENE GLYCOL 3350 17 G PO PACK
17.0000 g | PACK | Freq: Every day | ORAL | Status: DC
  Administered 2015-04-16 – 2015-04-17 (×2): 17 g via ORAL
  Filled 2015-04-16 (×2): qty 1

## 2015-04-16 MED ORDER — NIFEDIPINE ER OSMOTIC RELEASE 30 MG PO TB24
30.0000 mg | ORAL_TABLET | Freq: Every day | ORAL | Status: DC
  Administered 2015-04-16 – 2015-04-17 (×2): 30 mg via ORAL
  Filled 2015-04-16 (×2): qty 1

## 2015-04-16 MED ORDER — SODIUM CHLORIDE 0.9 % IJ SOLN: 3.0000 mL | INTRAMUSCULAR | Status: DC | PRN

## 2015-04-16 NOTE — Progress Notes (Signed)
Patient ID: Ashley Simon, female   DOB: 06/18/1985, 29 y.o.   MRN: [id number]  FACULTY PRACTICE ANTEPARTUM NOTE  Ashley Simon is a 29 y.o. G4P3003 at 823w3d  who is admitted for Preterm labor.   Fetal presentation is cephalic. Length of Stay:  1  Days  Subjective: No contractions.  Continues to have some abdominal pain - lower.  No vaginal bleeding.  Still a little difficulty with walking. Patient reports good fetal movement.   She reports occasional uterine contractions She reports no bleeding  She reports no loss of fluid per vagina.  Vitals:  Blood pressure 97/54, pulse 94, temperature 98.6 F (37 C), temperature source Oral, resp. rate 18, height 5\' 2"  (1.575 m), weight 147 lb (66.679 kg), last menstrual period 09/01/2014, SpO2 99 %. Physical Examination:  General appearance - alert, well appearing, and in no distress Chest - clear to auscultation, no wheezes, rales or rhonchi, symmetric air entry Heart - normal rate, regular rhythm, normal S1, S2, no murmurs, rubs, clicks or gallops Abdomen - soft, tender in the lower quadrants/lower uterine. Fundal Height:  size equals dates Extremities: extremities normal, atraumatic, no cyanosis or edema   Fetal Monitoring:  Baseline: 125 bpm, Variability: Good {> 6 bpm), Accelerations: Reactive and Decelerations: Absent  Labs:  No results found for this or any previous visit (from the past 24 hour(s)).  Imaging Studies:    none   Medications:  Scheduled . docusate sodium  100 mg Oral Daily  . famotidine  20 mg Oral BID  . NIFEdipine  30 mg Oral Daily  . prenatal multivitamin  1 tablet Oral Q1200   I have reviewed the patient's current medications.  ASSESSMENT: Patient Active Problem List   Diagnosis Date Noted  . Threatened preterm labor 04/15/2015  . Supervision of normal pregnancy 01/09/2015  . Smoker 01/09/2015  . H/O Depression with anxiety 01/09/2015  . Vaginal discharge 12/01/2012  . BV (bacterial vaginosis)  09/07/2012  . Asthma 09/06/2012  . Hx of chlamydia infection 09/06/2012  . Abnormal pap 09/06/2012  . Post partum depression 09/06/2012    PLAN: 1.  Threatened PTL  Transition from magnesium to procardia  S/p BMZ x2  If stays stable, d/c later today vs tomorrow. Continue routine antenatal care.   Rhona RaiderJacob J Kayd Launer, DO 04/16/2015,7:15 AM
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
## 2015-04-17 ENCOUNTER — Encounter (HOSPITAL_COMMUNITY): Payer: Self-pay | Admitting: Family Medicine

## 2015-04-17 DIAGNOSIS — O4703 False labor before 37 completed weeks of gestation, third trimester: Principal | ICD-10-CM

## 2015-04-17 LAB — CULTURE, BETA STREP (GROUP B ONLY)

## 2015-04-17 MED ORDER — FAMOTIDINE 20 MG PO TABS: 20.0000 mg | ORAL_TABLET | Freq: Two times a day (BID) | ORAL | Status: DC

## 2015-04-17 MED ORDER — NIFEDIPINE ER 30 MG PO TB24: 30.0000 mg | ORAL_TABLET | Freq: Every day | ORAL | Status: DC

## 2015-04-17 NOTE — Progress Notes (Signed)
Pt c/o dyspnea & chest tightness.  States happens "everytime I take that pill'.  Pt informed MD to be notified.  NAD noted. Resp rate 24, even & non-labored, color/pallor WNL.

## 2015-04-17 NOTE — Progress Notes (Signed)
Ms Ashley Simon was in significant pain from the medication that she was given.  When I entered the room she was sitting up at the edge of the bed and her breathing was labored.  She said she had experienced the same thing previously during this hospital stay after taking this medication. I asked her if the medical team was aware and when shook her head, I notified her nurse.    I came back later to check in on her, but she was in too much pain to talk.  She stated that she had been able to "get a lot off [her] chest" yesterday in talking with the social worker and was grateful for that.  She stated she is ready to go home and see her children, though she is concerned how she will cope with it given the pain the medicine causes her.  I inquired about support from family/friends to help her in the next few days and she replied, "I will figure it out."    [address]Chaplain Katy Keyani Rigdon, Bcc Pager, [phone] 2:22 PM    04/17/15 1400  Clinical Encounter Type  Visited With Patient  Visit Type Initial  Referral From Nurse

## 2015-04-17 NOTE — Discharge Summary (Signed)
Physician Discharge Summary  Patient ID: Ashley Simon MRN: [id number] DOB/AGE: 19-Mar-1986 29 y.o.  Admit date: 04/14/2015 Discharge date: 04/17/2015  Admission Diagnoses: preterm labor  Discharge Diagnoses:  Active Problems:   Threatened preterm labor   Discharged Condition: good  Hospital Course: 29 yo G4P3003 admitted secondary to preterm labor. She was [facility] following vaginal exam which found her to be 3 cm dilated. She received betamethasone and tocolysis with magnesium sulfate and subsequently with procardia. Her contractions stopped. Fetal status remained reassuring. On the day of discharge, her cervical exam had regressed to 2/50/ ballotable. Patient was found stable for discharge. Modified bedrest instructions were provided. Patient to follow up for routine prenatal care as scheduled on Thursday. Preterm labor/ fetal movement precautions reviewed  Consults: None  Treatments: tocolysis with magnesium sulfate and procardia  Discharge Exam: Blood pressure 97/49, pulse 94, temperature 98.5 F (36.9 C), temperature source Oral, resp. rate 18, height 5\' 2"  (1.575 m), weight 147 lb (66.679 kg), last menstrual period 09/01/2014, SpO2 99 %. General appearance: alert, cooperative and no distress Resp: clear to auscultation bilaterally Cardio: regular rate and rhythm GI: soft, non-tender; bowel sounds normal; no masses,  no organomegaly and gravid, NT Extremities: Homans sign is negative, no sign of DVT and no edema, redness or tenderness in the calves or thighs SVE: 2/50/ballotable  FHT: baseline 135, mod variability, + accels, no decels Toco: no contractions  Disposition: 01-Home or Self Care     Medication List    TAKE these medications        albuterol 108 (90 BASE) MCG/ACT inhaler  Commonly known as:  PROVENTIL HFA;VENTOLIN HFA  Inhale 1-2 puffs into the lungs every 6 (six) hours as needed for wheezing or shortness of breath. For shortness  of breath     famotidine 20 MG tablet  Commonly known as:  PEPCID  Take 1 tablet (20 mg total) by mouth 2 (two) times daily.     FLINSTONES GUMMIES OMEGA-3 DHA PO  Take 2 capsules by mouth daily.     NIFEdipine 30 MG 24 hr tablet  Commonly known as:  PROCARDIA-XL/ADALAT CC  Take 1 tablet (30 mg total) by mouth daily.           Follow-up Information    [facility]   Specialty:  Obstetrics and Gynecology   Why:  As scheduled on Thursday   Contact information:   [address] [place] [phone]      Signed: Catalina AntiguaONSTANT,Bradee Common 04/17/2015, 6:19 AM

## 2015-04-17 NOTE — Discharge Instructions (Signed)
Preterm Labor Information °Preterm labor is when labor starts before you are [redacted] weeks pregnant. The normal length of pregnancy is 39 to 41 weeks.  °CAUSES  °The cause of preterm labor is not often known. The most common known cause is infection. °RISK FACTORS °· Having a history of preterm labor. °· Having your water break before it should. °· Having a placenta that covers the opening of the cervix. °· Having a placenta that breaks away from the uterus. °· Having a cervix that is too weak to hold the baby in the uterus. °· Having too much fluid in the amniotic sac. °· Taking drugs or smoking while pregnant. °· Not gaining enough weight while pregnant. °· Being younger than 18 and older than 28 years old. °· Having a low income. °· Being African American. °SYMPTOMS °· Period-like cramps, belly (abdominal) pain, or back pain. °· Contractions that are regular, as often as six in an hour. They may be mild or painful. °· Contractions that start at the top of the belly. They then move to the lower belly and back. °· Lower belly pressure that seems to get stronger. °· Bleeding from the vagina. °· Fluid leaking from the vagina. °TREATMENT  °Treatment depends on: °· Your condition. °· The condition of your baby. °· How many weeks pregnant you are. °Your doctor may have you: °· Take medicine to stop contractions. °· Stay in bed except to use the restroom (bed rest). °· Stay in the hospital. °WHAT SHOULD YOU DO IF YOU THINK YOU ARE IN PRETERM LABOR? °Call your doctor right away. You need to go to the hospital right away.  °HOW CAN YOU PREVENT PRETERM LABOR IN FUTURE PREGNANCIES? °· Stop smoking, if you smoke. °· Maintain healthy weight gain. °· Do not take drugs or be around chemicals that are not needed. °· Tell your doctor if you think you have an infection. °· Tell your doctor if you had a preterm labor before. °  °This information is not intended to replace advice given to you by your health care provider. Make sure you  discuss any questions you have with your health care provider. °  °Document Released: 07/04/2008 Document Revised: 08/22/2014 Document Reviewed: 05/10/2012 °Elsevier Interactive Patient Education ©2016 [employer] Inc. ° °

## 2015-04-17 NOTE — Progress Notes (Signed)
Dr. Shawnie PonsPratt notified regarding pt c/o of dyspnea & chest tightness.  MD informed RR 24, Os sats 100 on room air, color WNL, NAD observed.

## 2015-04-19 ENCOUNTER — Telehealth: Payer: Self-pay | Admitting: *Deleted

## 2015-04-19 NOTE — Telephone Encounter (Signed)
[facility] for [facility], given Procardia 30 mg daily. Pt states Procardia causes her pain in chest and lower back. Pt states once she takes the Procardia she can not breathe and numbness in hands. Pt states [facility] but they told her it would take time to adjust to the med.   Pt states she doesn't thinks she can wait to be seen at her appt next week requesting to be sooner. Pt given an appt for tomorrow for evaluation.

## 2015-04-20 ENCOUNTER — Encounter: Payer: Self-pay | Admitting: Obstetrics and Gynecology

## 2015-04-20 ENCOUNTER — Ambulatory Visit (INDEPENDENT_AMBULATORY_CARE_PROVIDER_SITE_OTHER): Payer: Medicaid Other | Admitting: Obstetrics and Gynecology

## 2015-04-20 VITALS — BP 120/60 | HR 84 | Wt 146.0 lb

## 2015-04-20 DIAGNOSIS — R0600 Dyspnea, unspecified: Secondary | ICD-10-CM

## 2015-04-20 DIAGNOSIS — R0789 Other chest pain: Secondary | ICD-10-CM

## 2015-04-20 DIAGNOSIS — R202 Paresthesia of skin: Secondary | ICD-10-CM

## 2015-04-20 DIAGNOSIS — Z3493 Encounter for supervision of normal pregnancy, unspecified, third trimester: Secondary | ICD-10-CM

## 2015-04-20 DIAGNOSIS — Z331 Pregnant state, incidental: Secondary | ICD-10-CM

## 2015-04-20 DIAGNOSIS — O4703 False labor before 37 completed weeks of gestation, third trimester: Secondary | ICD-10-CM

## 2015-04-20 DIAGNOSIS — Z3483 Encounter for supervision of other normal pregnancy, third trimester: Secondary | ICD-10-CM

## 2015-04-20 DIAGNOSIS — Z3A33 33 weeks gestation of pregnancy: Secondary | ICD-10-CM

## 2015-04-20 DIAGNOSIS — Z1389 Encounter for screening for other disorder: Secondary | ICD-10-CM

## 2015-04-20 LAB — POCT URINALYSIS DIPSTICK
Glucose, UA: NEGATIVE
KETONES UA: NEGATIVE
Nitrite, UA: NEGATIVE
RBC UA: NEGATIVE

## 2015-04-20 LAB — GC/CHLAMYDIA PROBE AMP (~~LOC~~) NOT AT ARMC
CHLAMYDIA, DNA PROBE: NEGATIVE
NEISSERIA GONORRHEA: NEGATIVE

## 2015-04-20 NOTE — Patient Instructions (Signed)

## 2015-04-20 NOTE — Progress Notes (Addendum)
Patient ID: Ashley Simon, female   DOB: March 02, 1986, 29 y.o.   MRN: [id number] [facility]  [id number] 784w0d Estimated Date of Delivery: 06/08/15  Blood pressure 120/60, pulse 84, weight 146 lb (66.225 kg), last menstrual period 09/01/2014.   refer to the ob flow sheet for FH and FHR, also BP, Wt, Urine results:notable for trace leukocytes, +1 protein   Patient reports  + good fetal movement, denies any bleeding and no rupture of membranes symptoms or regular contractions. Patient complaints:moderate chest. Pt states [facility]15 d/t preterm labor with dyspnea and chest tightness. She was prescribed Procardia and notes that this medication causes her chest to hurt, numbness in her hands and feet, lower back pain. Pt states an hour after she takes the medication she experiences these symptoms. Pt notes she wakes up with the numbness and reports resolution within 3 hours. She denies hyperventilation.   FH- 34 cm  Cervix- 1 cm long, soft, -2  Questions were answered. Assessment: LROB G4P3003 @ 724w0d Chest tightness  = anxiety No cervical changes Arm paresthesias due to sleep positions Advised pt to adjust positioning when sleeping to prevent numbness  US scheduled on 04/26/15.   Plan:   Continued routine obstetrical care,  Discontinue Procardia.   F/u in 2 weeks for routine prenatal care    By signing my name below, I, Doreatha MartinEva Mathews, attest that this documentation has been prepared under the direction and in the presence of Tilda BurrowJohn Wrigley Plasencia V, MD. Electronically Signed: Doreatha MartinEva Mathews, ED Scribe. 04/20/2015. 11:25 AM.  `````Attestation of Attending Supervision of Advanced Practitioner: Evaluation and management procedures were performed by the PA/NP/CNM/OB Fellow under my supervision/collaboration. Chart reviewed and agree with management and plan.  Aneshia Jacquet V 04/20/2015 11:38 AM    s

## 2015-04-22 NOTE — L&D Delivery Note (Signed)
Patient is 30 y.o. [id number] [redacted]w[redacted]d admitted for SOL, hx of threatened PTL, PPD, asthma, depression/anxiety No augmentation, normal labor progression, SROM @ 1130 - fluid initially clear then bloody colored   Delivery Note At 12:13 PM a viable female was delivered via Vaginal, Spontaneous Delivery (Presentation: cephalic; LOA).  APGAR: good tone/cry/color; weight  pending.   Placenta status: Intact, Spontaneous - 4cm area of clot with bloody fluid suggesting possible abruption.  Cord: 3 vessels with the following complications: None.    Anesthesia: Epidural  Episiotomy: None Lacerations: 1st degree Suture Repair: none Est. Blood Loss (mL): 150  Mom to postpartum.  Baby to Couplet care / Skin to Skin.  Amber Heckart 05/17/2015, 12:40 PM      Upon arrival patient was complete and pushing. She pushed with good maternal effort to deliver a healthy baby girl. Baby delivered without difficulty, was noted to have good tone and place on maternal abdomen for oral suctioning, drying and stimulation. Delayed cord clamping performed. Placenta delivered intact with 3V cord - 4cm area of clot with bloody fluid suggesting possible abruption Vaginal canal and perineum was inspected and 1st degree perineal laceration was hemostatic, no repair performed. Pitocin was started and uterus massaged until bleeding slowed. Counts of sharps, instruments, and lap pads were all correct.   Wynne Dust, MD, PGY-1  I was gloved and present for entire delivery Agree with note Baby delivered easily No difficulty with shoulders Placental examination revealed small clot, which, coupled with port wine fluid seen prior to delivery indicates possible small abruption Baby was vigorous, however and did well.   Aviva Signs, CNM

## 2015-04-26 ENCOUNTER — Ambulatory Visit (INDEPENDENT_AMBULATORY_CARE_PROVIDER_SITE_OTHER): Payer: Medicaid Other | Admitting: Obstetrics and Gynecology

## 2015-04-26 ENCOUNTER — Encounter: Payer: Medicaid Other | Admitting: Obstetrics and Gynecology

## 2015-04-26 ENCOUNTER — Ambulatory Visit (INDEPENDENT_AMBULATORY_CARE_PROVIDER_SITE_OTHER): Payer: Medicaid Other

## 2015-04-26 ENCOUNTER — Other Ambulatory Visit: Payer: Medicaid Other

## 2015-04-26 VITALS — BP 120/80 | HR 94 | Wt 146.0 lb

## 2015-04-26 DIAGNOSIS — Z3483 Encounter for supervision of other normal pregnancy, third trimester: Secondary | ICD-10-CM

## 2015-04-26 DIAGNOSIS — Z3A34 34 weeks gestation of pregnancy: Secondary | ICD-10-CM

## 2015-04-26 DIAGNOSIS — Z3493 Encounter for supervision of normal pregnancy, unspecified, third trimester: Secondary | ICD-10-CM

## 2015-04-26 DIAGNOSIS — Z1389 Encounter for screening for other disorder: Secondary | ICD-10-CM

## 2015-04-26 DIAGNOSIS — O365931 Maternal care for other known or suspected poor fetal growth, third trimester, fetus 1: Secondary | ICD-10-CM

## 2015-04-26 DIAGNOSIS — Z302 Encounter for sterilization: Secondary | ICD-10-CM

## 2015-04-26 DIAGNOSIS — Z309 Encounter for contraceptive management, unspecified: Secondary | ICD-10-CM | POA: Insufficient documentation

## 2015-04-26 DIAGNOSIS — Z331 Pregnant state, incidental: Secondary | ICD-10-CM

## 2015-04-26 LAB — POCT URINALYSIS DIPSTICK
GLUCOSE UA: NEGATIVE
Ketones, UA: NEGATIVE
Leukocytes, UA: NEGATIVE
NITRITE UA: NEGATIVE
Protein, UA: NEGATIVE
RBC UA: NEGATIVE

## 2015-04-26 NOTE — Progress Notes (Signed)
Patient ID: Ashley Simon, female   DOB: 05-16-1985, 30 y.o.   MRN: [id number]  [id number] 5486w6d Estimated Date of Delivery: 06/08/15  Last menstrual period 09/01/2014.   refer to the ob flow sheet for FH and FHR, also BP, Wt, Urine results:notable for 1+ protein, trace leukocytes  Patient reports  + good fetal movement, denies any bleeding and no rupture of membranes symptoms or regular contractions. Patient complaints: None. Patient states she desires BTL.   FH: 33 cm FHR: 145 bpm  Questions were answered. Assessment: LROB G4P3003 @ 8586w6d                          Size = dates today, will proceed with u/s for growth                        Desires PP sterilization:  TO SIGN BTL PAPERS FOR MA    Plan:  Continued routine obstetrical care,   F/u in 2 weeks for pnx care, tubal ligation papers    By signing my name below, I, Ashley Simon, attest that this documentation has been prepared under the direction and in the presence of Tilda BurrowJohn Yoshimi Sarr V, MD. Electronically Signed: Ronney LionSuzanne Simon, ED Scribe. 04/26/2015. 4:43 PM.  I personally performed the services described in this documentation, which was SCRIBED in my presence. The recorded information has been reviewed and considered accurate. It has been edited as necessary during review. Tilda BurrowFERGUSON,Ivey Cina V, MD

## 2015-04-26 NOTE — Progress Notes (Signed)
Pt states that she recently ate fruit loops and that she had some itching afterwards. Pt states that she is also craving paper and corn startch.

## 2015-04-26 NOTE — Progress Notes (Signed)
US 33+6wks,cephalic,fht 155 bpm,post pl gr 1,afi 11.4cm,bilat adnexa's wnl,efw 2338 48%

## 2015-05-10 ENCOUNTER — Encounter: Payer: Medicaid Other | Admitting: Advanced Practice Midwife

## 2015-05-15 ENCOUNTER — Ambulatory Visit (INDEPENDENT_AMBULATORY_CARE_PROVIDER_SITE_OTHER): Payer: Medicaid Other | Admitting: Advanced Practice Midwife

## 2015-05-15 VITALS — BP 94/48 | HR 90 | Wt 155.8 lb

## 2015-05-15 DIAGNOSIS — Z369 Encounter for antenatal screening, unspecified: Secondary | ICD-10-CM

## 2015-05-15 DIAGNOSIS — Z1389 Encounter for screening for other disorder: Secondary | ICD-10-CM

## 2015-05-15 DIAGNOSIS — Z331 Pregnant state, incidental: Secondary | ICD-10-CM

## 2015-05-15 DIAGNOSIS — O98313 Other infections with a predominantly sexual mode of transmission complicating pregnancy, third trimester: Secondary | ICD-10-CM | POA: Diagnosis not present

## 2015-05-15 DIAGNOSIS — Z3A37 37 weeks gestation of pregnancy: Secondary | ICD-10-CM

## 2015-05-15 DIAGNOSIS — F53 Postpartum depression: Secondary | ICD-10-CM

## 2015-05-15 DIAGNOSIS — Z3483 Encounter for supervision of other normal pregnancy, third trimester: Secondary | ICD-10-CM | POA: Diagnosis not present

## 2015-05-15 DIAGNOSIS — O99345 Other mental disorders complicating the puerperium: Secondary | ICD-10-CM

## 2015-05-15 DIAGNOSIS — A5909 Other urogenital trichomoniasis: Secondary | ICD-10-CM | POA: Diagnosis not present

## 2015-05-15 DIAGNOSIS — Z3685 Encounter for antenatal screening for Streptococcus B: Secondary | ICD-10-CM

## 2015-05-15 DIAGNOSIS — Z3493 Encounter for supervision of normal pregnancy, unspecified, third trimester: Secondary | ICD-10-CM

## 2015-05-15 LAB — OB RESULTS CONSOLE GBS: GBS: NEGATIVE

## 2015-05-15 MED ORDER — METRONIDAZOLE 500 MG PO TABS: 2000.0000 mg | ORAL_TABLET | Freq: Once | ORAL | Status: DC

## 2015-05-15 NOTE — Patient Instructions (Signed)

## 2015-05-15 NOTE — Progress Notes (Signed)
G2X5284 [redacted]w[redacted]d Estimated Date of Delivery: 06/08/15  Blood pressure 94/48, pulse 90, weight 155 lb 12.8 oz (70.67 kg), last menstrual period 09/01/2014.   BP weight and urine results all reviewed and noted.  Please refer to the obstetrical flow sheet for the fundal height and fetal heart rate documentation:  Patient reports good fetal movement, denies any bleeding and no rupture of membranes symptoms or regular contractions. Patient still has dc Was given IV flagyl in [facility]U for trich a month ago hasn't had sex since then. Wet prep + trich, WBC All questions were answered.  Orders Placed This Encounter  Procedures  . GC/Chlamydia Probe Amp  . POCT urinalysis dipstick    Plan:  Continued routine obstetrical care, BTL papers.  GBS, GC/chl collected. Return in about 1 week (around 05/22/2015) for LROB.

## 2015-05-16 LAB — GC/CHLAMYDIA PROBE AMP
CHLAMYDIA, DNA PROBE: NEGATIVE
NEISSERIA GONORRHOEAE BY PCR: NEGATIVE

## 2015-05-17 ENCOUNTER — Inpatient Hospital Stay (HOSPITAL_COMMUNITY): Payer: Medicaid Other | Admitting: Anesthesiology

## 2015-05-17 ENCOUNTER — Encounter (HOSPITAL_COMMUNITY): Payer: Self-pay

## 2015-05-17 ENCOUNTER — Inpatient Hospital Stay (HOSPITAL_COMMUNITY)
Admission: AD | Admit: 2015-05-17 | Discharge: 2015-05-19 | DRG: 775 | Disposition: A | Discharge status: Home / Self Care | Payer: Medicaid Other | Source: Ambulatory Visit | Attending: Obstetrics & Gynecology | Admitting: Obstetrics & Gynecology

## 2015-05-17 DIAGNOSIS — Z87891 Personal history of nicotine dependence: Secondary | ICD-10-CM

## 2015-05-17 DIAGNOSIS — IMO0001 Reserved for inherently not codable concepts without codable children: Secondary | ICD-10-CM

## 2015-05-17 DIAGNOSIS — O9952 Diseases of the respiratory system complicating childbirth: Principal | ICD-10-CM | POA: Diagnosis present

## 2015-05-17 DIAGNOSIS — Z833 Family history of diabetes mellitus: Secondary | ICD-10-CM | POA: Diagnosis not present

## 2015-05-17 DIAGNOSIS — A5909 Other urogenital trichomoniasis: Secondary | ICD-10-CM

## 2015-05-17 DIAGNOSIS — O99344 Other mental disorders complicating childbirth: Secondary | ICD-10-CM | POA: Diagnosis present

## 2015-05-17 DIAGNOSIS — Z3A36 36 weeks gestation of pregnancy: Secondary | ICD-10-CM

## 2015-05-17 DIAGNOSIS — F418 Other specified anxiety disorders: Secondary | ICD-10-CM | POA: Diagnosis not present

## 2015-05-17 DIAGNOSIS — Z8249 Family history of ischemic heart disease and other diseases of the circulatory system: Secondary | ICD-10-CM | POA: Diagnosis not present

## 2015-05-17 DIAGNOSIS — J45909 Unspecified asthma, uncomplicated: Secondary | ICD-10-CM | POA: Diagnosis present

## 2015-05-17 DIAGNOSIS — Z3493 Encounter for supervision of normal pregnancy, unspecified, third trimester: Secondary | ICD-10-CM

## 2015-05-17 LAB — CBC
HEMATOCRIT: 34.5 % — AB (ref 36.0–46.0)
Hemoglobin: 11.2 g/dL — ABNORMAL LOW (ref 12.0–15.0)
MCH: 29.2 pg (ref 26.0–34.0)
MCHC: 32.5 g/dL (ref 30.0–36.0)
MCV: 90.1 fL (ref 78.0–100.0)
PLATELETS: 167 10*3/uL (ref 150–400)
RBC: 3.83 MIL/uL — AB (ref 3.87–5.11)
RDW: 15.4 % (ref 11.5–15.5)
WBC: 13 10*3/uL — AB (ref 4.0–10.5)

## 2015-05-17 LAB — TYPE AND SCREEN
ABO/RH(D): O POS
Antibody Screen: NEGATIVE

## 2015-05-17 LAB — STREP GP B NAA: Strep Gp B NAA: NEGATIVE

## 2015-05-17 MED ORDER — ACETAMINOPHEN 325 MG PO TABS
650.0000 mg | ORAL_TABLET | ORAL | Status: DC | PRN
  Administered 2015-05-17: 650 mg via ORAL
  Filled 2015-05-17 (×4): qty 2

## 2015-05-17 MED ORDER — DIPHENHYDRAMINE HCL 25 MG PO CAPS: 25.0000 mg | ORAL_CAPSULE | Freq: Four times a day (QID) | ORAL | Status: DC | PRN

## 2015-05-17 MED ORDER — LIDOCAINE HCL (PF) 1 % IJ SOLN
30.0000 mL | INTRAMUSCULAR | Status: DC | PRN
  Filled 2015-05-17: qty 30

## 2015-05-17 MED ORDER — OXYTOCIN BOLUS FROM INFUSION
500.0000 mL | INTRAVENOUS | Status: DC
  Administered 2015-05-17: 500 mL via INTRAVENOUS

## 2015-05-17 MED ORDER — BENZOCAINE-MENTHOL 20-0.5 % EX AERO
1.0000 "application " | INHALATION_SPRAY | CUTANEOUS | Status: DC | PRN
  Administered 2015-05-17: 1 via TOPICAL
  Filled 2015-05-17: qty 56

## 2015-05-17 MED ORDER — IBUPROFEN 600 MG PO TABS
600.0000 mg | ORAL_TABLET | Freq: Four times a day (QID) | ORAL | Status: DC
  Administered 2015-05-17 – 2015-05-19 (×7): 600 mg via ORAL
  Filled 2015-05-17 (×8): qty 1

## 2015-05-17 MED ORDER — ONDANSETRON HCL 4 MG/2ML IJ SOLN: 4.0000 mg | INTRAMUSCULAR | Status: DC | PRN

## 2015-05-17 MED ORDER — LANOLIN HYDROUS EX OINT: TOPICAL_OINTMENT | CUTANEOUS | Status: DC | PRN

## 2015-05-17 MED ORDER — FLEET ENEMA 7-19 GM/118ML RE ENEM: 1.0000 | ENEMA | RECTAL | Status: DC | PRN

## 2015-05-17 MED ORDER — FENTANYL CITRATE (PF) 100 MCG/2ML IJ SOLN
100.0000 ug | Freq: Once | INTRAMUSCULAR | Status: AC
  Administered 2015-05-17: 100 ug via INTRAVENOUS
  Filled 2015-05-17: qty 2

## 2015-05-17 MED ORDER — LACTATED RINGERS IV SOLN
2.5000 [IU]/h | INTRAVENOUS | Status: DC
  Administered 2015-05-17: 2.5 [IU]/h via INTRAVENOUS
  Filled 2015-05-17: qty 4

## 2015-05-17 MED ORDER — LACTATED RINGERS IV SOLN
INTRAVENOUS | Status: DC
  Administered 2015-05-17: 10:00:00 via INTRAVENOUS

## 2015-05-17 MED ORDER — LIDOCAINE HCL (PF) 1 % IJ SOLN
INTRAMUSCULAR | Status: DC | PRN
  Administered 2015-05-17 (×2): 8 mL via EPIDURAL

## 2015-05-17 MED ORDER — ACETAMINOPHEN 325 MG PO TABS
650.0000 mg | ORAL_TABLET | ORAL | Status: DC | PRN
  Administered 2015-05-18 (×2): 650 mg via ORAL

## 2015-05-17 MED ORDER — ONDANSETRON HCL 4 MG PO TABS: 4.0000 mg | ORAL_TABLET | ORAL | Status: DC | PRN

## 2015-05-17 MED ORDER — LACTATED RINGERS IV SOLN
500.0000 mL | INTRAVENOUS | Status: DC | PRN
  Administered 2015-05-17: 1000 mL via INTRAVENOUS

## 2015-05-17 MED ORDER — ONDANSETRON HCL 4 MG/2ML IJ SOLN: 4.0000 mg | Freq: Four times a day (QID) | INTRAMUSCULAR | Status: DC | PRN

## 2015-05-17 MED ORDER — OXYCODONE-ACETAMINOPHEN 5-325 MG PO TABS: 1.0000 | ORAL_TABLET | ORAL | Status: DC | PRN

## 2015-05-17 MED ORDER — FENTANYL 2.5 MCG/ML BUPIVACAINE 1/10 % EPIDURAL INFUSION (WH - ANES)
14.0000 mL/h | INTRAMUSCULAR | Status: DC | PRN
  Administered 2015-05-17: 14 mL/h via EPIDURAL
  Filled 2015-05-17: qty 125

## 2015-05-17 MED ORDER — CITRIC ACID-SODIUM CITRATE 334-500 MG/5ML PO SOLN: 30.0000 mL | ORAL | Status: DC | PRN

## 2015-05-17 MED ORDER — ZOLPIDEM TARTRATE 5 MG PO TABS: 5.0000 mg | ORAL_TABLET | Freq: Every evening | ORAL | Status: DC | PRN

## 2015-05-17 MED ORDER — DIBUCAINE 1 % RE OINT
1.0000 "application " | TOPICAL_OINTMENT | RECTAL | Status: DC | PRN
  Administered 2015-05-17: 1 via RECTAL
  Filled 2015-05-17: qty 28

## 2015-05-17 MED ORDER — EPHEDRINE 5 MG/ML INJ
10.0000 mg | INTRAVENOUS | Status: DC | PRN
  Filled 2015-05-17: qty 2

## 2015-05-17 MED ORDER — TETANUS-DIPHTH-ACELL PERTUSSIS 5-2.5-18.5 LF-MCG/0.5 IM SUSP
0.5000 mL | Freq: Once | INTRAMUSCULAR | Status: AC
  Administered 2015-05-19: 0.5 mL via INTRAMUSCULAR

## 2015-05-17 MED ORDER — OXYCODONE-ACETAMINOPHEN 5-325 MG PO TABS: 2.0000 | ORAL_TABLET | ORAL | Status: DC | PRN

## 2015-05-17 MED ORDER — PHENYLEPHRINE 40 MCG/ML (10ML) SYRINGE FOR IV PUSH (FOR BLOOD PRESSURE SUPPORT)
80.0000 ug | PREFILLED_SYRINGE | INTRAVENOUS | Status: DC | PRN
  Filled 2015-05-17: qty 2
  Filled 2015-05-17: qty 20

## 2015-05-17 MED ORDER — SIMETHICONE 80 MG PO CHEW: 80.0000 mg | CHEWABLE_TABLET | ORAL | Status: DC | PRN

## 2015-05-17 MED ORDER — WITCH HAZEL-GLYCERIN EX PADS
1.0000 "application " | MEDICATED_PAD | CUTANEOUS | Status: DC | PRN
  Administered 2015-05-17: 1 via TOPICAL

## 2015-05-17 MED ORDER — SENNOSIDES-DOCUSATE SODIUM 8.6-50 MG PO TABS
2.0000 | ORAL_TABLET | ORAL | Status: DC
  Administered 2015-05-17 – 2015-05-19 (×2): 2 via ORAL
  Filled 2015-05-17 (×2): qty 2

## 2015-05-17 MED ORDER — DIPHENHYDRAMINE HCL 50 MG/ML IJ SOLN: 12.5000 mg | INTRAMUSCULAR | Status: DC | PRN

## 2015-05-17 NOTE — Anesthesia Procedure Notes (Signed)
Epidural Patient location during procedure: OB Start time: 05/17/2015 10:23 AM End time: 05/17/2015 10:27 AM  Staffing Anesthesiologist: Leilani Able Performed by: anesthesiologist   Preanesthetic Checklist Completed: patient identified, surgical consent, pre-op evaluation, timeout performed, IV checked, risks and benefits discussed and monitors and equipment checked  Epidural Patient position: sitting Prep: site prepped and draped and DuraPrep Patient monitoring: continuous pulse ox and blood pressure Approach: midline Location: L3-L4 Injection technique: LOR air  Needle:  Needle type: Tuohy  Needle gauge: 17 G Needle length: 9 cm and 9 Needle insertion depth: 5 cm cm Catheter type: closed end flexible Catheter size: 19 Gauge Catheter at skin depth: 10 cm Test dose: negative and Other  Assessment Sensory level: T9 Events: blood not aspirated, injection not painful, no injection resistance, negative IV test and no paresthesia  Additional Notes Reason for block:procedure for pain

## 2015-05-17 NOTE — Anesthesia Preprocedure Evaluation (Signed)
Anesthesia Evaluation  Patient identified by MRN, date of birth, ID band Patient awake    Reviewed: Allergy & Precautions, H&P , NPO status , Patient's Chart, lab work & pertinent test results  Airway Mallampati: I  TM Distance: >3 FB Neck ROM: full    Dental no notable dental hx.    Pulmonary former smoker,    Pulmonary exam normal        Cardiovascular negative cardio ROS Normal cardiovascular exam     Neuro/Psych    GI/Hepatic negative GI ROS, Neg liver ROS,   Endo/Other  negative endocrine ROS  Renal/GU negative Renal ROS     Musculoskeletal   Abdominal Normal abdominal exam  (+)   Peds  Hematology negative hematology ROS (+)   Anesthesia Other Findings   Reproductive/Obstetrics (+) Pregnancy                             Anesthesia Physical Anesthesia Plan  ASA: II  Anesthesia Plan: Epidural   Post-op Pain Management:    Induction:   Airway Management Planned:   Additional Equipment:   Intra-op Plan:   Post-operative Plan:   Informed Consent: I have reviewed the patients History and Physical, chart, labs and discussed the procedure including the risks, benefits and alternatives for the proposed anesthesia with the patient or authorized representative who has indicated his/her understanding and acceptance.     Plan Discussed with:   Anesthesia Plan Comments:         Anesthesia Quick Evaluation  

## 2015-05-17 NOTE — Progress Notes (Signed)
Williams and hekert at bedside.

## 2015-05-17 NOTE — Lactation Note (Signed)
This note was copied from the chart of Girl Wilda Early. Lactation Consultation Note  Patient Name: Girl Charma Mocarski Today's Date: 05/17/2015 Reason for consult: Initial assessment Baby at 8 hr of life and mom reports bf is going well. She reports nipple soreness, no skin break down noted. Encouraged her to call for latch help. Discussed baby behavior, LPT infant guidelines,  feeding frequency, baby belly size, voids, wt loss, breast changes, and nipple care. Mom declined DEBP, spoon feeding, and manual expression. Given lactation and LPT infant handouts. Aware of OP services and support group.      Maternal Data Has patient been taught Hand Expression?: Yes Does the patient have breastfeeding experience prior to this delivery?: Yes  Feeding Feeding Type: Formula Nipple Type: Slow - flow  LATCH Score/Interventions                      Lactation Tools Discussed/Used WIC Program: Yes   Consult Status Consult Status: Follow-up Date: 05/18/15 Follow-up type: In-patient    Rulon Eisenmenger 05/17/2015, 9:12 PM

## 2015-05-17 NOTE — H&P (Signed)
LABOR ADMISSION HISTORY AND PHYSICAL  Ashley Simon is a 30 y.o. female [phone] with IUP at [redacted]w[redacted]d by LMP c/w 8wk u/s presenting for SOL. She reports strong, frequent contractions beginning this morning that woke her up.  Denies VB, LOF.  Good FM  Denies blurry vision, headaches, peripheral edema, or RUQ pain.  She plans on breast/bottle feeding. She request BLT (papers signed) for birth control.  Dating: By LMP c/w 8wk u/s --->  Estimated Date of Delivery: 06/08/15  Sono:    +6wks,cephalic,fht 155 bpm,post pl gr 1,afi 11.4cm,bilat adnexa's wnl,efw 2338 48%   Prenatal History/Complications: Threatened PTL - 3 day admission on procardia (12/24) Asthma Depression w/ Anxiety Post partum depression  Past Medical History: Past Medical History  Diagnosis Date  . Asthma   . Depression   . Anxiety   . Migraine   . Hx of chlamydia infection   . Abnormal Pap smear   . BV (bacterial vaginosis) 09/07/2012  . Vaginal discharge 12/01/2012    +clue cells will rx metrogel and get GC/CHL    Past Surgical History: Past Surgical History  Procedure Laterality Date  . No past surgeries      Obstetrical History: OB History    Gravida Para Term Preterm AB TAB SAB Ectopic Multiple Living   Social History: Social History   Social History  . Marital Status: Single    Spouse Name: N/A  . Number of Children: N/A  . Years of Education: N/A   Social History Main Topics  . Smoking status: Former Smoker -- 0.25 packs/day for .5 years    Types: Cigarettes  . Smokeless tobacco: Never Used  . Alcohol Use: No  . Drug Use: No  . Sexual Activity: Not Currently    Birth Control/ Protection: None   Other Topics Concern  . None   Social History Narrative    Family History: Family History  Problem Relation Age of Onset  . Other Neg Hx   . Hypertension Paternal Grandfather   . Diabetes Paternal Grandfather   . Hypertension Paternal Grandmother   . Diabetes Paternal  Grandmother   . Cancer Paternal Grandmother     breast  . Hypertension Maternal Grandmother   . Coronary artery disease Maternal Grandfather   . Hypertension Maternal Grandfather   . Cancer Maternal Grandfather     colon  . Heart disease Maternal Grandfather   . Diabetes Father   . Asthma Mother   . Cancer Mother     breast  . Cancer Maternal Aunt     breast and ovarian    Allergies: Allergies  Allergen Reactions  . Calcium-Containing Compounds Nausea And Vomiting    Prescriptions prior to admission  Medication Sig Dispense Refill Last Dose  . albuterol (PROVENTIL HFA;VENTOLIN HFA) 108 (90 BASE) MCG/ACT inhaler Inhale 1-2 puffs into the lungs every 6 (six) hours as needed for wheezing or shortness of breath. For shortness of breath 1 Inhaler 0 Taking  . famotidine (PEPCID) 20 MG tablet Take 1 tablet (20 mg total) by mouth 2 (two) times daily. 60 tablet 1 Taking  . metroNIDAZOLE (FLAGYL) 500 MG tablet Take 4 tablets (2,000 mg total) by mouth once. 4 tablet 0   . Pediatric Multiple Vit-C-FA (FLINSTONES GUMMIES OMEGA-3 DHA PO) Take 2 capsules by mouth daily.   Taking     Review of Systems   All systems reviewed and negative except as  stated in HPI  BP 115/67 mmHg  Pulse 101  Wt 70.308 kg (155 lb)  LMP 09/01/2014 General appearance: alert, appears stated age and moderate distress Lungs: clear to auscultation bilaterally Heart: regular rate and rhythm Abdomen: soft, non-tender; bowel sounds normal Pelvic: adequate, see cervical exam below  Extremities: Homans sign is negative, no sign of DVT, edema DTR's nl Presentation: cephalic Fetal monitoringBaseline: 130s bpm, Variability: Fair (1-6 bpm) and Accelerations: Reactive Uterine activityFrequency: Every 3 minutes Dilation: Lip/rim Effacement (%): 100 Station: -1 Exam by:: Erin Fulling md   Prenatal labs: ABO, Rh: --/--/O POS, O POS (12/25 0350) Antibody: NEG (12/25 0350) Rubella: !Error!immune RPR: Non Reactive  (11/22 0907)  HBsAg: Negative (12/25 0350)  HIV: Non Reactive (12/25 0350)  GBS: Negative (01/24 1400)  2 hr Glucola 73/159/93 Genetic screening  NT/IT too late, CfDNA normal XX Anatomy US normal female  Prenatal Transfer Tool  Maternal Diabetes: No Genetic Screening: Normal Maternal Ultrasounds/Referrals: Normal Fetal Ultrasounds or other Referrals:  None Maternal Substance Abuse:  No Significant Maternal Medications:  None Significant Maternal Lab Results: GBS NAA neg  Results for orders placed or performed during the hospital encounter of 05/17/15 (from the past 24 hour(s))  CBC   Collection Time: 05/17/15  9:23 AM  Result Value Ref Range   WBC 13.0 (H) 4.0 - 10.5 K/uL   RBC 3.83 (L) 3.87 - 5.11 MIL/uL   Hemoglobin 11.2 (L) 12.0 - 15.0 g/dL   HCT 16.1 (L) 09.6 - 04.5 %   MCV 90.1 78.0 - 100.0 fL   MCH 29.2 26.0 - 34.0 pg   MCHC 32.5 30.0 - 36.0 g/dL   RDW 40.9 81.1 - 91.4 %   Platelets 167 150 - 400 K/uL    Patient Active Problem List   Diagnosis Date Noted  . Active labor at term 05/17/2015  . Trichomonal cervicitis 05/15/2015  . Encounter for contraceptive management 04/26/2015  . Threatened preterm labor 04/15/2015  . Supervision of normal pregnancy 01/09/2015  . Smoker 01/09/2015  . H/O Depression with anxiety 01/09/2015  . BV (bacterial vaginosis) 09/07/2012  . Asthma 09/06/2012  . Hx of chlamydia infection 09/06/2012  . Abnormal pap 09/06/2012  . Post partum depression 09/06/2012    Assessment: Ashley Simon is a 30 y.o. G4P3003 at [redacted]w[redacted]d here for SOL, presented in active labor.  #Active Labor:  #PTL @ 36.6w:  -admit to L&D with routine orders  -expectant mgmt  -deferring betamethasone and PCN due to expectant precipitous delivery (multiparous pt at 9.5cm dilation)  #FWB: Category 2 -Continue monitoring   # Pain Control:  -analgesia prn - fentanyl x1 -would like epidural  # GBS: NAA neg -no abx  # Postpartum plan:  -Feeding:  breast -Contraception: BTL - papers signed  Wynne Dust, MD, PGY-1  OB

## 2015-05-17 NOTE — MAU Note (Signed)
Pt arrived via ems with c/o contractions starting this morning at 7am. Pt denies bleeding and leaking of fluid.

## 2015-05-18 LAB — RPR: RPR Ser Ql: NONREACTIVE

## 2015-05-18 MED ORDER — PRENATAL MULTIVITAMIN CH
1.0000 | ORAL_TABLET | Freq: Every day | ORAL | Status: DC
  Administered 2015-05-18: 1 via ORAL
  Filled 2015-05-18 (×2): qty 1

## 2015-05-18 NOTE — Clinical Social Work Maternal (Signed)
CLINICAL SOCIAL WORK MATERNAL/CHILD NOTE  Patient Details  Name: Ashley Simon MRN: [id number] Date of Birth: 10/11/1985  Date:  05/18/2015  Clinical Social Worker Initiating Note:  Edina Winningham E. Delron Comer, LCSW Date/ Time Initiated:  05/18/15/1315     Child's Name:  Ashley Simon   Legal Guardian:   (Parents: Ashley Simon and Ashley Simon)   Need for Interpreter:  None   Date of Referral:  05/18/15     Reason for Referral:  Other (Comment) (Hx of Anxiety and PPD.)   Referral Source:  Central Nursery   Address:  [address] [place], [place] [address]  Phone number:  [phone]   Household Members:  Parents, Minor Children (MOB states she lives with her mother and 3 other children, ages 8, 7, and 3.)   Natural Supports (not living in the home):  Immediate Family, Extended Family (MOB talks about a cousin and aunt who are involved and supportive.  She states her main support is "myself.")   Professional Supports:     Employment:     Type of Work:  (MOB works at [employer] in [place], but her job is "on hold" because of lack of transportation at this time.)   Education:      Financial Resources:  Medicaid   Other Resources:      Cultural/Religious Considerations Which May Impact Care: None stated.  Strengths:  Ability to meet basic needs , Pediatrician chosen , Home prepared for child  (Pediatric follow up will be at the [facility].)   Risk Factors/Current Problems:  Mental Health Concerns , Other (Comment) (Grief)   Cognitive State:  Alert , Linear Thinking , Goal Oriented , Insightful    Mood/Affect:  Calm , Interested , Relaxed    CSW Assessment: CSW met with MOB in her first floor room/143 to introduce services, offer support, and complete assessment due to hx of Anxiety and PPD.  FOB was present and MOB states she can talk openly with him in room.  MOB was busy cleaning up and straightening her bed, but eventually sat down and talked with  CSW without distraction.  FOB was holding baby until he left early in the discussion.  MOB was pleasant and seemed open to talking with CSW. MOB reports she did not initially feel excited about the pregnancy, but she accepted it and is "good now."  She reports labor and delivery were "terrible" and she is glad that is behind her.  She states this has been her worst delivery based on how far she was progressed when she got to the hospital and how the epidural did not fully work.  She states feeling well now, but endorses feelings of anxiety and depression in the past few months.  She states she has a plan to move forward and states she is already feeling better, knowing that she has made a plan.  She states she lives with her mother currently and that it is "hell," but denies any safety concerns.  She states her mother has Bipolar Disorder and drinks heavily.  CSW inquired as to who is caring for her children while she is in the hospital and she assured CSW that her mother is not.  She states they are being cared for between a cousin, her grandmother and an aunt, although there has been "drama" over making these arrangements.  She states it is all worked out at this time and although she is confident they are being cared for, she is eager to   get home to them.   MOB expressed significant grief in the last few months with her sister being incarcerated and one uncle shooting and killing another uncle.  She states she deeply misses both of them.  CSW talked about how she can memorialize her uncle, as MOB states this is very important to her.  She states she can never let her children forget him.  CSW advised grief counseling as she seems very saddened while talking about these situations.  She states she is not sure she has time and feels like with having a bachelor degree in Psychology, she should be able to figure it out on her own.  CSW suggested that maybe having a bachelor degree in Psychology, she knows how  important mental health is and how beneficial counseling can be.  She smiled and agreed.  She states a willingness to take resources and consider them in the future.  CSW thanked her for this willingness and looked at it as a strength, and not a downfall.  CSW provided her with counseling resources. CSW provided education regarding signs and symptoms of PPD as a review.  MOB was engaged and seemed interested.  She states she had PPD "real bad" after her 29 year old, but attributes symptoms to the negative relationship she was in.  She states she still loves her 3 year old's father, but knows she made the right decision by ending that toxic relationship.  CSW commends her for this.  MOB states little concern that she will experience PPD again.  She states baby's father is involved, however, they are not in a relationship.  She states they have just always been good friends and she thinks now that he wanted a relationship with her, but she didn't know it at the time.  She states they really don't talk about it and she does not feel she is ready to define their relationship because of her feelings for her ex.   MOB states plans to get a new vehicle with her tax return and states she has already talked to a car salesperson about this.  She also plans to move out of her mother's home, in approximately two weeks, to her own place in [place].  She states she has friends there and will be able to go back to work.  She is motivated and eager to make this move. MOB states she has needed baby supplies at home and states no further questions, concerns or needs for CSW at this time.  She thanked CSW for the visit and seemed to appreciate the support offered and CSW's concern for her emotional wellbeing.  CSW encouraged her to follow up with counseling.  CSW identifies no further interventions needed or barriers to discharge when MOB and baby are medically ready.  CSW Plan/Description:  Patient/Family Education ,  Information/Referral to Community Resources , No Further Intervention Required/No Barriers to Discharge    Raenah Murley Elizabeth, LCSW 05/18/2015, 4:49 PM 

## 2015-05-18 NOTE — Anesthesia Postprocedure Evaluation (Signed)
Anesthesia Post Note  Patient: Ashley Simon  Procedure(s) Performed: * No procedures listed *  Patient location during evaluation: Mother Baby Anesthesia Type: Epidural Level of consciousness: awake and alert, oriented and patient cooperative Pain management: pain level controlled Vital Signs Assessment: post-procedure vital signs reviewed and stable Respiratory status: spontaneous breathing Cardiovascular status: stable Postop Assessment: no headache, epidural receding, patient able to bend at knees and no signs of nausea or vomiting Anesthetic complications: no Comments: Pt interviewed ~0920.  Per patient conversation.  Pt arrived at hospital 8cm dilated.  Received epidural.  Epidural one sided and patient placed on side and given bolus.  Pt then 10cm dilated and delivered baby with one sided block.  Pt states currently pain controlled.      Last Vitals:  Filed Vitals:   05/18/15 0158 05/18/15 0700  BP: 114/55 97/55  Pulse: 93 73  Temp: 36.7 C 36.8 C  Resp: 18 18    Last Pain:  Filed Vitals:   05/18/15 0815  PainSc: 0-No pain                 Tameisha Covell

## 2015-05-18 NOTE — Progress Notes (Signed)
Post Partum Day 1 Subjective: no complaints, up ad lib, voiding and tolerating PO, small lochia, plans to breastfeed, plans to bottle feed, bilateral tubal ligation  Objective: Blood pressure 97/55, pulse 73, temperature 98.3 F (36.8 C), temperature source Oral, resp. rate 18, height  (1.575 m), weight 71.668 kg (158 lb), last menstrual period 09/01/2014, SpO2 98 %, unknown if currently breastfeeding.  Physical Exam:  General: alert, cooperative and no distress Lochia:normal flow Chest: CTAB Heart: RRR no m/r/g Abdomen: +BS, soft, nontender,  Uterine Fundus: firm DVT Evaluation: No evidence of DVT seen on physical exam. Extremities: no edema   Recent Labs  05/17/15 0923  HGB 11.2*  HCT 34.5*    Assessment/Plan: Plan for discharge tomorrow   LOS: 1 day   CRESENZO-DISHMAN,Teneisha Gignac 05/18/2015, 7:48 AM

## 2015-05-19 MED ORDER — IBUPROFEN 600 MG PO TABS: 600.0000 mg | ORAL_TABLET | Freq: Four times a day (QID) | ORAL | Status: DC | PRN

## 2015-05-19 NOTE — Discharge Instructions (Signed)

## 2015-05-19 NOTE — Discharge Summary (Signed)
OB Discharge Summary     Patient Name: Ashley Simon DOB: [birth year] MRN: [id number]  Date of admission: 05/17/2015 Delivering MD: Aviva Signs   Date of discharge: 05/19/2015  Admitting diagnosis: 37WKS,LABOR Intrauterine pregnancy: [redacted]w[redacted]d     Secondary diagnosis:  Active Problems:   Active labor at term  Additional problems: none     Discharge diagnosis: Preterm Pregnancy Delivered @ 36.3wks                                                                                              Post partum procedures:none  Augmentation: none  Complications: None  Hospital course:  Onset of Labor With Vaginal Delivery     30 y.o. yo [id number] at [redacted]w[redacted]d was admitted in Active Labor on 05/17/2015. Patient had an uncomplicated labor course as follows:  Membrane Rupture Time/Date: 11:36 AM ,05/17/2015   Intrapartum Procedures: Episiotomy: None [1]                                         Lacerations:  1st degree [2]  Patient had a delivery of a Viable infant. 05/17/2015  Information for the patient's newborn:  Jane, Birkel Girl Yusra [[id number]]  Delivery Method: Vaginal, Spontaneous Delivery (Filed from Delivery Summary)    Pateint had an uncomplicated postpartum course.  She is ambulating, tolerating a regular diet, passing flatus, and urinating well. Patient is discharged home in stable condition on 05/19/2015.    Physical exam  Filed Vitals:   05/18/15 0158 05/18/15 0700 05/18/15 1835 05/19/15 0533  BP: 114/55 97/55 100/61 102/56  Pulse: 93 73 74 75  Temp: 98.1 F (36.7 C) 98.3 F (36.8 C) 98.1 F (36.7 C) 98.6 F (37 C)  TempSrc: Oral  Oral   Resp: Height:      Weight:      SpO2: 98%      General: alert and cooperative Lochia: appropriate Uterine Fundus: firm Incision: N/A DVT Evaluation: No evidence of DVT seen on physical exam. Labs: Lab Results  Component Value Date   WBC 13.0* 05/17/2015   HGB 11.2* 05/17/2015   HCT 34.5* 05/17/2015   MCV 90.1  05/17/2015   PLT 167 05/17/2015   CMP Latest Ref Rng 10/29/2014  Glucose 65 - 99 mg/dL 81  BUN 6 - 20 mg/dL 12  Creatinine 1.30 - 8.65 mg/dL 7.84  Sodium 696 - 295 mmol/L 137  Potassium 3.5 - 5.1 mmol/L 3.5  Chloride 101 - 111 mmol/L 107  CO2 22 - 32 mmol/L 23  Calcium 8.9 - 10.3 mg/dL 9.0  Total Protein 6.5 - 8.1 g/dL 6.6  Total Bilirubin 0.3 - 1.2 mg/dL 0.4  Alkaline Phos 38 - 126 U/L 48  AST 15 - 41 U/L 27  ALT 14 - 54 U/L 28    Discharge instruction: per After Visit Summary and "Baby and Me Booklet".  After visit meds:    Medication List    STOP taking these medications  famotidine 20 MG tablet  Commonly known as:  PEPCID     metroNIDAZOLE 500 MG tablet  Commonly known as:  FLAGYL      TAKE these medications        albuterol 108 (90 Base) MCG/ACT inhaler  Commonly known as:  PROVENTIL HFA;VENTOLIN HFA  Inhale 1-2 puffs into the lungs every 6 (six) hours as needed for wheezing or shortness of breath. For shortness of breath     FLINSTONES GUMMIES OMEGA-3 DHA PO  Take 2 capsules by mouth daily.     ibuprofen 600 MG tablet  Commonly known as:  ADVIL,MOTRIN  Take 1 tablet (600 mg total) by mouth every 6 (six) hours as needed.     VITAMIN D PO  Take 1 tablet by mouth daily.        Diet: routine diet  Activity: Advance as tolerated. Pelvic rest for 6 weeks.   Outpatient follow up:6 weeks Follow up Appt:No future appointments. Follow up Visit:No Follow-up on file.  Postpartum contraception: Tubal Ligation- already sched w/ FT  Newborn Data: Live born female  Birth Weight: 7 lb 1.4 oz (3215 g) APGAR: 9, 9  Baby Feeding: Bottle and Breast Disposition:home with mother   05/19/2015 Cam Hai, CNM  9:03 AM

## 2015-05-22 ENCOUNTER — Encounter: Payer: Medicaid Other | Admitting: Obstetrics & Gynecology

## 2015-05-30 ENCOUNTER — Ambulatory Visit: Payer: Medicaid Other | Admitting: Advanced Practice Midwife

## 2015-05-30 ENCOUNTER — Encounter: Payer: Self-pay | Admitting: Advanced Practice Midwife

## 2015-06-04 ENCOUNTER — Telehealth: Payer: Self-pay | Admitting: *Deleted

## 2015-06-04 NOTE — Telephone Encounter (Signed)
Ashley Simon, [facility], states pt scored 17 on Edinburgh Depression Scale, c/o no appetite. Pt offered an appt today but states unable to come until Friday, June 08, 2015. Ashley Simon state pt is not having any thoughts of suicide or harming the anyone.

## 2015-06-08 ENCOUNTER — Ambulatory Visit: Payer: Self-pay | Admitting: Obstetrics and Gynecology

## 2015-06-08 ENCOUNTER — Encounter: Payer: Self-pay | Admitting: Obstetrics and Gynecology

## 2015-06-28 ENCOUNTER — Telehealth: Payer: Self-pay | Admitting: *Deleted

## 2015-06-28 NOTE — Telephone Encounter (Signed)
Pt states she is having feelings of depression, no thoughts of harming herself or others, but needs to be seen. Pt had an appt 06/08/2015 and no showed, Edinburgh Depression scale 17 [facility]. Pt given an appt tomorrow for evaluation.

## 2015-06-29 ENCOUNTER — Ambulatory Visit (INDEPENDENT_AMBULATORY_CARE_PROVIDER_SITE_OTHER): Payer: Medicaid Other | Admitting: Adult Health

## 2015-06-29 ENCOUNTER — Encounter: Payer: Self-pay | Admitting: Adult Health

## 2015-06-29 DIAGNOSIS — Z3202 Encounter for pregnancy test, result negative: Secondary | ICD-10-CM

## 2015-06-29 DIAGNOSIS — R519 Headache, unspecified: Secondary | ICD-10-CM

## 2015-06-29 DIAGNOSIS — G8929 Other chronic pain: Secondary | ICD-10-CM

## 2015-06-29 DIAGNOSIS — R51 Headache: Secondary | ICD-10-CM

## 2015-06-29 DIAGNOSIS — Z309 Encounter for contraceptive management, unspecified: Secondary | ICD-10-CM

## 2015-06-29 DIAGNOSIS — N760 Acute vaginitis: Secondary | ICD-10-CM

## 2015-06-29 DIAGNOSIS — N898 Other specified noninflammatory disorders of vagina: Secondary | ICD-10-CM

## 2015-06-29 DIAGNOSIS — Z30013 Encounter for initial prescription of injectable contraceptive: Secondary | ICD-10-CM

## 2015-06-29 DIAGNOSIS — O99345 Other mental disorders complicating the puerperium: Secondary | ICD-10-CM

## 2015-06-29 DIAGNOSIS — B9689 Other specified bacterial agents as the cause of diseases classified elsewhere: Secondary | ICD-10-CM

## 2015-06-29 DIAGNOSIS — F53 Postpartum depression: Secondary | ICD-10-CM

## 2015-06-29 HISTORY — DX: Other specified noninflammatory disorders of vagina: N89.8

## 2015-06-29 HISTORY — DX: Encounter for contraceptive management, unspecified: Z30.9

## 2015-06-29 HISTORY — DX: Headache, unspecified: R51.9

## 2015-06-29 HISTORY — DX: Postpartum depression: F53.0

## 2015-06-29 HISTORY — DX: Other chronic pain: G89.29

## 2015-06-29 LAB — POCT URINE PREGNANCY: PREG TEST UR: NEGATIVE

## 2015-06-29 MED ORDER — ESCITALOPRAM OXALATE 10 MG PO TABS: 10.0000 mg | ORAL_TABLET | Freq: Every day | ORAL | Status: DC

## 2015-06-29 MED ORDER — METRONIDAZOLE 500 MG PO TABS: 500.0000 mg | ORAL_TABLET | Freq: Two times a day (BID) | ORAL | Status: DC

## 2015-06-29 MED ORDER — MEDROXYPROGESTERONE ACETATE 150 MG/ML IM SUSP: 150.0000 mg | INTRAMUSCULAR | Status: DC

## 2015-06-29 NOTE — Patient Instructions (Signed)
Postpartum Depression and Baby Blues The postpartum period begins right after the birth of a baby. During this time, there is often a great amount of joy and excitement. It is also a time of many changes in the life of the parents. Regardless of how many times a mother gives birth, each child brings new challenges and dynamics to the family. It is not unusual to have feelings of excitement along with confusing shifts in moods, emotions, and thoughts. All mothers are at risk of developing postpartum depression or the "baby blues." These mood changes can occur right after giving birth, or they may occur many months after giving birth. The baby blues or postpartum depression can be mild or severe. Additionally, postpartum depression can go away rather quickly, or it can be a long-term condition.  CAUSES Raised hormone levels and the rapid drop in those levels are thought to be a main cause of postpartum depression and the baby blues. A number of hormones change during and after pregnancy. Estrogen and progesterone usually decrease right after the delivery of your baby. The levels of thyroid hormone and various cortisol steroids also rapidly drop. Other factors that play a role in these mood changes include major life events and genetics.  RISK FACTORS If you have any of the following risks for the baby blues or postpartum depression, know what symptoms to watch out for during the postpartum period. Risk factors that may increase the likelihood of getting the baby blues or postpartum depression include: Having a personal or family history of depression.  Having depression while being pregnant.  Having premenstrual mood issues or mood issues related to oral contraceptives. Having a lot of life stress.  Having marital conflict.  Lacking a social support network.  Having a baby with special needs.  Having health problems, such as diabetes.  SIGNS AND SYMPTOMS Symptoms of baby blues include: Brief  changes in mood, such as going from extreme happiness to sadness. Decreased concentration.  Difficulty sleeping.  Crying spells, tearfulness.  Irritability.  Anxiety.  Symptoms of postpartum depression typically begin within the first month after giving birth. These symptoms include: Difficulty sleeping or excessive sleepiness.  Marked weight loss.  Agitation.  Feelings of worthlessness.  Lack of interest in activity or food.  Postpartum psychosis is a very serious condition and can be dangerous. Fortunately, it is rare. Displaying any of the following symptoms is cause for immediate medical attention. Symptoms of postpartum psychosis include:  Hallucinations and delusions.  Bizarre or disorganized behavior.  Confusion or disorientation.  DIAGNOSIS  A diagnosis is made by an evaluation of your symptoms. There are no medical or lab tests that lead to a diagnosis, but there are various questionnaires that a health care provider may use to identify those with the baby blues, postpartum depression, or psychosis. Often, a screening tool called the [place] Postnatal Depression Scale is used to diagnose depression in the postpartum period.  TREATMENT The baby blues usually goes away on its own in 1-2 weeks. Social support is often all that is needed. You will be encouraged to get adequate sleep and rest. Occasionally, you may be given medicines to help you sleep.  Postpartum depression requires treatment because it can last several months or longer if it is not treated. Treatment may include individual or group therapy, medicine, or both to address any social, physiological, and psychological factors that may play a role in the depression. Regular exercise, a healthy diet, rest, and social support may also be strongly  recommended.  Postpartum psychosis is more serious and needs treatment right away. Hospitalization is often needed. HOME CARE INSTRUCTIONS Get as much rest as you can.  Nap when the baby sleeps.  Exercise regularly. Some women find yoga and walking to be beneficial.  Eat a balanced and nourishing diet.  Do little things that you enjoy. Have a cup of tea, take a bubble bath, read your favorite magazine, or listen to your favorite music. Avoid alcohol.  Ask for help with household chores, cooking, grocery shopping, or running errands as needed. Do not try to do everything.  Talk to people close to you about how you are feeling. Get support from your partner, family members, friends, or other new moms. Try to stay positive in how you think. Think about the things you are grateful for.  Do not spend a lot of time alone.  Only take over-the-counter or prescription medicine as directed by your health care provider. Keep all your postpartum appointments.  Let your health care provider know if you have any concerns.  SEEK MEDICAL CARE IF: You are having a reaction to or problems with your medicine. SEEK IMMEDIATE MEDICAL CARE IF: You have suicidal feelings.  You think you may harm the baby or someone else. MAKE SURE YOU: Understand these instructions. Will watch your condition. Will get help right away if you are not doing well or get worse.   This information is not intended to replace advice given to you by your health care provider. Make sure you discuss any questions you have with your health care provider.   Document Released: 01/10/2004 Document Revised: 04/12/2013 Document Reviewed: 01/17/2013 Elsevier Interactive Patient Education 2016 Elsevier Inc. Bacterial Vaginosis Bacterial vaginosis is a vaginal infection that occurs when the normal balance of bacteria in the vagina is disrupted. It results from an overgrowth of certain bacteria. This is the most common vaginal infection in women of childbearing age. Treatment is important to prevent complications, especially in pregnant women, as it can cause a premature delivery. CAUSES  Bacterial  vaginosis is caused by an increase in harmful bacteria that are normally present in smaller amounts in the vagina. Several different kinds of bacteria can cause bacterial vaginosis. However, the reason that the condition develops is not fully understood. RISK FACTORS Certain activities or behaviors can put you at an increased risk of developing bacterial vaginosis, including:  Having a new sex partner or multiple sex partners.  Douching.  Using an intrauterine device (IUD) for contraception. Women do not get bacterial vaginosis from toilet seats, bedding, swimming pools, or contact with objects around them. SIGNS AND SYMPTOMS  Some women with bacterial vaginosis have no signs or symptoms. Common symptoms include:  Grey vaginal discharge.  A fishlike odor with discharge, especially after sexual intercourse.  Itching or burning of the vagina and vulva.  Burning or pain with urination. DIAGNOSIS  Your health care provider will take a medical history and examine the vagina for signs of bacterial vaginosis. A sample of vaginal fluid may be taken. Your health care provider will look at this sample under a microscope to check for bacteria and abnormal cells. A vaginal pH test may also be done.  TREATMENT  Bacterial vaginosis may be treated with antibiotic medicines. These may be given in the form of a pill or a vaginal cream. A second round of antibiotics may be prescribed if the condition comes back after treatment. Because bacterial vaginosis increases your risk for sexually transmitted diseases, getting treated can  help reduce your risk for chlamydia, gonorrhea, HIV, and herpes. HOME CARE INSTRUCTIONS   Only take over-the-counter or prescription medicines as directed by your health care provider.  If antibiotic medicine was prescribed, take it as directed. Make sure you finish it even if you start to feel better.  Tell all sexual partners that you have a vaginal infection. They should see  their health care provider and be treated if they have problems, such as a mild rash or itching.  During treatment, it is important that you follow these instructions:  Avoid sexual activity or use condoms correctly.  Do not douche.  Avoid alcohol as directed by your health care provider.  Avoid breastfeeding as directed by your health care provider. SEEK MEDICAL CARE IF:   Your symptoms are not improving after 3 days of treatment.  You have increased discharge or pain.  You have a fever. MAKE SURE YOU:   Understand these instructions.  Will watch your condition.  Will get help right away if you are not doing well or get worse. FOR MORE INFORMATION  Centers for Disease Control and Prevention, Division of STD Prevention: SolutionApps.co.zawww.cdc.gov/std American Sexual Health Association (ASHA): www.ashastd.org    This information is not intended to replace advice given to you by your health care provider. Make sure you discuss any questions you have with your health care provider.   Document Released: 04/07/2005 Document Revised: 04/28/2014 Document Reviewed: 11/17/2012 Elsevier Interactive Patient Education 2016 [employer] No alcohol or sex Follow up in 3 days for depo Follow up in 3 weeks with me

## 2015-06-29 NOTE — Progress Notes (Signed)
Patient ID: Ashley Simon, female   DOB: [birth year], 30 y.o.   MRN: [id number] Orvil FeilDenee is a 30 year old black female in for postpartum visit, as work in, had missed prior appt. And is feeling depressed and has vaginal odor and wants to get on Depo.She denies any suicidal ideations or homicidal ideations.G4P4.  Delivery Date:05/17/15  Method of Delivery: Vaginal delivery baby girl 7 lbs 1 oz, no stitches  Sexual Activity since delivery: No  Method of Feeding: Bottle now but did breast feed for 3 weeks  Number of weeks bleeding post delivery:  3 weeks  Review of Systems: Patient denies any  Daily headaches(but has chronic headaches by history), hearing loss, fatigue, blurred vision, shortness of breath, chest pain, abdominal pain, problems with bowel movements, urination, or intercourse(no sex yet). No joint pain, See HPI for positives.  Reviewed past medical,surgical, social and family history. Reviewed medications and allergies.   Depression Score: 18  BP 102/70 mmHg  Pulse 76  Ht 5\' 2"  (1.575 m)  Wt 139 lb (63.05 kg)  BMI 25.42 kg/m2  LMP 06/22/2015  Breastfeeding? No UPT negative. Skin warm and dry. Neck: mid line trachea, normal thyroid, good ROM, no lymphadenopathy noted. Lungs: clear to ausculation bilaterally. Cardiovascular: regular rate and rhythm. Pelvic Exam:   External genitalia is normal in appearance, no lesions.  The vagina has good color, moisture and rugae,+pink discharge with odor, no lesions.Urethra has no masses or tenderness noted. The cervix is bulbous.  Uterus is felt to be normal size, shape, and contour, well involuted.  No adnexal masses or tenderness noted.Bladder is non tender, no masses felt.Wet prep +WBC,+RBC and + clue cells, no hemorrhoids noted.Discussed asking for help with kids and take time for her self will try lexapro, she says she does not want to feel high, I assured her she would not, but would take about 2 weeks to get in system good, if not  better at F/U will add counseling if needed. Try to eat often and increase fluids   Impression:  Status post delivery, post partum check, depression screening,Postpartum depression contraceptive management, BV, vaginal odor   Plan:   Rx flagyl 500 mg 1 bid x 7 days, no alcohol, review handout on BV, No sex Rx lexapro 10 mg #30 take 1/2 tab for 4-5 days then 1 daily with 6 refills Rx Depo provera 150 mg, disp.# 1 vail for IM injection every 3 months in office, with 4 refills [facility] and [facility] in pm Return in 3 weeks to see me in follow up of starting lexapro   Review handout on postpartum depression

## 2015-07-02 ENCOUNTER — Ambulatory Visit: Payer: Medicaid Other

## 2015-07-02 ENCOUNTER — Other Ambulatory Visit: Payer: Medicaid Other

## 2015-07-03 ENCOUNTER — Encounter: Payer: Self-pay | Admitting: *Deleted

## 2015-07-03 ENCOUNTER — Other Ambulatory Visit: Payer: Medicaid Other

## 2015-07-03 ENCOUNTER — Ambulatory Visit (INDEPENDENT_AMBULATORY_CARE_PROVIDER_SITE_OTHER): Payer: Medicaid Other | Admitting: *Deleted

## 2015-07-03 DIAGNOSIS — Z3202 Encounter for pregnancy test, result negative: Secondary | ICD-10-CM | POA: Diagnosis not present

## 2015-07-03 DIAGNOSIS — Z30013 Encounter for initial prescription of injectable contraceptive: Secondary | ICD-10-CM | POA: Diagnosis not present

## 2015-07-03 DIAGNOSIS — Z3042 Encounter for surveillance of injectable contraceptive: Secondary | ICD-10-CM

## 2015-07-03 LAB — POCT URINALYSIS DIPSTICK
Glucose, UA: NEGATIVE
PROTEIN UA: NEGATIVE

## 2015-07-03 LAB — POCT URINE PREGNANCY: Preg Test, Ur: NEGATIVE

## 2015-07-03 LAB — BETA HCG QUANT (REF LAB): HCG QUANT: 1 m[IU]/mL

## 2015-07-03 MED ORDER — MEDROXYPROGESTERONE ACETATE 150 MG/ML IM SUSP
150.0000 mg | Freq: Once | INTRAMUSCULAR | Status: AC
  Administered 2015-07-03: 150 mg via INTRAMUSCULAR

## 2015-07-03 NOTE — Progress Notes (Signed)
Pt here for Depo. Pt tolerated shot well. Return in 12 weeks for next shot. JSY 

## 2015-07-09 ENCOUNTER — Encounter (HOSPITAL_COMMUNITY): Payer: Self-pay | Admitting: Emergency Medicine

## 2015-07-09 ENCOUNTER — Emergency Department (HOSPITAL_COMMUNITY)
Admission: EM | Admit: 2015-07-09 | Discharge: 2015-07-09 | Disposition: A | Discharge status: Home / Self Care | Payer: Medicaid Other | Attending: Emergency Medicine | Admitting: Emergency Medicine

## 2015-07-09 ENCOUNTER — Emergency Department (HOSPITAL_COMMUNITY): Payer: Medicaid Other

## 2015-07-09 DIAGNOSIS — F17210 Nicotine dependence, cigarettes, uncomplicated: Secondary | ICD-10-CM | POA: Diagnosis not present

## 2015-07-09 DIAGNOSIS — J45909 Unspecified asthma, uncomplicated: Secondary | ICD-10-CM | POA: Insufficient documentation

## 2015-07-09 DIAGNOSIS — J111 Influenza due to unidentified influenza virus with other respiratory manifestations: Secondary | ICD-10-CM | POA: Insufficient documentation

## 2015-07-09 DIAGNOSIS — F329 Major depressive disorder, single episode, unspecified: Secondary | ICD-10-CM | POA: Insufficient documentation

## 2015-07-09 DIAGNOSIS — R05 Cough: Secondary | ICD-10-CM | POA: Diagnosis present

## 2015-07-09 DIAGNOSIS — R69 Illness, unspecified: Secondary | ICD-10-CM

## 2015-07-09 MED ORDER — IBUPROFEN 800 MG PO TABS
800.0000 mg | ORAL_TABLET | Freq: Once | ORAL | Status: AC
  Administered 2015-07-09: 800 mg via ORAL
  Filled 2015-07-09: qty 1

## 2015-07-09 NOTE — ED Notes (Signed)
Pt c/o cough, nasal congestion, HA x 7 days.

## 2015-07-09 NOTE — ED Provider Notes (Signed)
CSN: [id number]     Arrival date & time 07/09/15  1614 History   First MD Initiated Contact with Patient 07/09/15 1742     Chief Complaint  Patient presents with  . Cough     (Consider location/radiation/quality/duration/timing/severity/associated sxs/prior Treatment) HPI Comments: Patient complains of 3 day history of cough, nasal congestion, "sinus pressure" and headache. Her children have been sick with similar illness. Contrary to triage note she is only been sick for 3 days. She endorses cough productive of clear mucus with nasal congestion, headache and body aches and chills. She did not receive a flu shot. Good by mouth intake and urine output. History of asthma but no wheezing. No abdominal pain, nausea or vomiting. No leg pain or leg swelling. She is not taking any medications.  Patient is a 30 y.o. female presenting with cough. The history is provided by the patient.  Cough Associated symptoms: chills, headaches, myalgias and rhinorrhea   Associated symptoms: no chest pain and no sore throat     Past Medical History  Diagnosis Date  . Asthma   . Depression   . Anxiety   . Migraine   . Hx of chlamydia infection   . Abnormal Pap smear   . BV (bacterial vaginosis) 09/07/2012  . Vaginal discharge 12/01/2012    +clue cells will rx metrogel and get GC/CHL  . Postpartum care and examination 06/29/2015  . 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 Vaginal odor 06/29/2015  . Chronic headaches 06/29/2015  . Contraceptive management 06/29/2015   Past Surgical History  Procedure Laterality Date  . No past surgeries     Family History  Problem Relation Age of Onset  . Hypertension Paternal Grandfather   . Diabetes Paternal Grandfather   . Hypertension Paternal Grandmother   . Diabetes Paternal Grandmother   . Cancer Paternal Grandmother     breast  . Hypertension Maternal Grandmother   . Coronary artery disease Maternal Grandfather   . Hypertension Maternal Grandfather   . Cancer  Maternal Grandfather     colon  . Heart disease Maternal Grandfather   . Diabetes Father   . Asthma Mother   . Cancer Mother     breast  . Cancer Maternal Aunt     breast and ovarian  . Epilepsy Daughter   . Other Daughter     iron def   Social History  Substance Use Topics  . Smoking status: Current Every Day Smoker -- 1.00 packs/day for 2 years    Types: Cigarettes  . Smokeless tobacco: Never Used  . Alcohol Use: Yes     Comment: wine    OB History    Gravida Para Term Preterm AB TAB SAB Ectopic Multiple Living   0 4     Review of Systems  Constitutional: Positive for chills. Negative for activity change, appetite change and fatigue.  HENT: Positive for congestion and rhinorrhea. Negative for sore throat.   Eyes: Negative for visual disturbance.  Respiratory: Positive for cough.   Cardiovascular: Negative for chest pain.  Gastrointestinal: Negative for nausea, vomiting and abdominal pain.  Genitourinary: Negative for dysuria, hematuria, vaginal bleeding and vaginal discharge.  Musculoskeletal: Positive for myalgias and arthralgias.  Skin: Negative for wound.  Neurological: Positive for weakness and headaches. Negative for dizziness and light-headedness.   A complete 10 system review of systems was obtained and all systems are negative except as noted in the HPI and PMH.  Allergies  Calcium-containing compounds  Home Medications   Prior to Admission medications   Medication Sig Start Date End Date Taking? Authorizing Provider  albuterol (PROVENTIL HFA;VENTOLIN HFA) 108 (90 BASE) MCG/ACT inhaler Inhale 1-2 puffs into the lungs every 6 (six) hours as needed for wheezing or shortness of breath. For shortness of breath 01/09/15   Cheral MarkerKimberly R Booker, CNM  Cholecalciferol (VITAMIN D PO) Take 1 tablet by mouth daily.    Historical Provider, MD  escitalopram (LEXAPRO) 10 MG tablet Take 1 tablet (10 mg total) by mouth daily. 06/29/15   Adline PotterJennifer A Griffin, NP   medroxyPROGESTERone (DEPO-PROVERA) 150 MG/ML injection Inject 1 mL (150 mg total) into the muscle every 3 (three) months. 06/29/15   Adline PotterJennifer A Griffin, NP  metroNIDAZOLE (FLAGYL) 500 MG tablet Take 1 tablet (500 mg total) by mouth 2 (two) times daily. 06/29/15   Adline PotterJennifer A Griffin, NP   BP 118/71 mmHg  Pulse 95  Temp(Src) 97.9 F (36.6 C) (Oral)  Resp 18  Ht 5\' 2"  (1.575 m)  Wt 133 lb (60.328 kg)  BMI 24.32 kg/m2  SpO2 100%  LMP 06/22/2015 Physical Exam  Constitutional: She is oriented to person, place, and time. She appears well-developed and well-nourished. No distress.  HENT:  Head: Normocephalic and atraumatic.  Mouth/Throat: Oropharynx is clear and moist. No oropharyngeal exudate.  No sinus tenderness OP clear  Eyes: Conjunctivae and EOM are normal. Pupils are equal, round, and reactive to light.  Neck: Normal range of motion. Neck supple.  No meningismus.  Cardiovascular: Normal rate, regular rhythm, normal heart sounds and intact distal pulses.   No murmur heard. Pulmonary/Chest: Effort normal and breath sounds normal. No respiratory distress. She has no wheezes.  Abdominal: Soft. There is no tenderness. There is no rebound and no guarding.  Musculoskeletal: Normal range of motion. She exhibits no edema or tenderness.  Neurological: She is alert and oriented to person, place, and time. No cranial nerve deficit. She exhibits normal muscle tone. Coordination normal.  No ataxia on finger to nose bilaterally. No pronator drift. 5/5 strength throughout. CN 2-12 intact.Equal grip strength. Sensation intact.   Skin: Skin is warm.  Psychiatric: She has a normal mood and affect. Her behavior is normal.  Nursing note and vitals reviewed.   ED Course  Procedures (including critical care time) Labs Review Labs Reviewed - No data to display  Imaging Review Dg Chest 2 View  07/09/2015  CLINICAL DATA:  Productive cough with fever and congestion for 3 days. Current smoker with  history of asthma. EXAM: CHEST  2 VIEW COMPARISON:  07/03/2014 and 07/22/2013. FINDINGS: The heart size and mediastinal contours are normal. The lungs are clear. There is no pleural effusion or pneumothorax. No acute osseous findings are identified. IMPRESSION: Stable chest.  No active cardiopulmonary process. Electronically Signed   By: Carey BullocksWilliam  Veazey M.D.   On: 07/09/2015 18:31   I have personally reviewed and evaluated these images and lab results as part of my medical decision-making.   EKG Interpretation None      MDM   Final diagnoses:  Influenza-like illness   History of asthma with URI symptoms and multiple sick contacts at home. Lungs clear. No hypoxia. No wheezing. No nausea or vomiting.  CXR negative. No wheezing on exam. Tolerating fluids well in the ED. No meningismus. Talking on the phone in no distress.  Supportive care for likely viral illness, possibly influenza. Discussed by mouth hydration at home, antipyretics, follow up with PCP. Return precautions  discussed.   Glynn Octave, MD 07/10/15 [phone]

## 2015-07-09 NOTE — ED Notes (Signed)
Sprite given to patient to drink. Tolerated well. 

## 2015-07-09 NOTE — Discharge Instructions (Signed)

## 2015-07-09 NOTE — ED Notes (Signed)
Unable to locate patient at d/c. 

## 2015-07-20 ENCOUNTER — Ambulatory Visit: Payer: Medicaid Other | Admitting: Adult Health

## 2015-07-20 ENCOUNTER — Encounter: Payer: Self-pay | Admitting: *Deleted

## 2015-08-19 ENCOUNTER — Encounter (HOSPITAL_COMMUNITY): Payer: Self-pay | Admitting: Emergency Medicine

## 2015-08-19 ENCOUNTER — Emergency Department (HOSPITAL_COMMUNITY)
Admission: EM | Admit: 2015-08-19 | Discharge: 2015-08-19 | Disposition: A | Discharge status: Home / Self Care | Payer: Medicaid Other | Attending: Emergency Medicine | Admitting: Emergency Medicine

## 2015-08-19 DIAGNOSIS — J302 Other seasonal allergic rhinitis: Secondary | ICD-10-CM

## 2015-08-19 DIAGNOSIS — F329 Major depressive disorder, single episode, unspecified: Secondary | ICD-10-CM | POA: Diagnosis not present

## 2015-08-19 DIAGNOSIS — F17210 Nicotine dependence, cigarettes, uncomplicated: Secondary | ICD-10-CM | POA: Insufficient documentation

## 2015-08-19 DIAGNOSIS — Z79899 Other long term (current) drug therapy: Secondary | ICD-10-CM | POA: Diagnosis not present

## 2015-08-19 MED ORDER — LORATADINE-PSEUDOEPHEDRINE ER 5-120 MG PO TB12: 1.0000 | ORAL_TABLET | Freq: Two times a day (BID) | ORAL | Status: DC

## 2015-08-19 MED ORDER — DEXAMETHASONE SODIUM PHOSPHATE 4 MG/ML IJ SOLN
8.0000 mg | Freq: Once | INTRAMUSCULAR | Status: AC
  Administered 2015-08-19: 8 mg via INTRAMUSCULAR
  Filled 2015-08-19: qty 2

## 2015-08-19 MED ORDER — DIPHENHYDRAMINE HCL 25 MG PO CAPS
25.0000 mg | ORAL_CAPSULE | Freq: Once | ORAL | Status: AC
  Administered 2015-08-19: 25 mg via ORAL
  Filled 2015-08-19: qty 1

## 2015-08-19 MED ORDER — DIPHENHYDRAMINE HCL 25 MG PO TABS: ORAL_TABLET | ORAL | Status: DC

## 2015-08-19 MED ORDER — DEXAMETHASONE 4 MG PO TABS: 4.0000 mg | ORAL_TABLET | Freq: Two times a day (BID) | ORAL | Status: DC

## 2015-08-19 NOTE — Discharge Instructions (Signed)
claritin D every 12 hours. Take decadron every 12 hours also. Use benadryl at bed time. See Dr Willa RoughHicks for allergy testing if not improving.  Allergies An allergy is an abnormal reaction to a substance by the body's defense system (immune system). Allergies can develop at any age. WHAT CAUSES ALLERGIES? An allergic reaction happens when the immune system mistakenly reacts to a normally harmless substance, called an allergen, as if it were harmful. The immune system releases antibodies to fight the substance. Antibodies eventually release a chemical called histamine into the bloodstream. The release of histamine is meant to protect the body from infection, but it also causes discomfort. An allergic reaction can be triggered by:  Eating an allergen.  Inhaling an allergen.  Touching an allergen. WHAT TYPES OF ALLERGIES ARE THERE? There are many types of allergies. Common types include:  Seasonal allergies. People with this type of allergy are usually allergic to substances that are only present during certain seasons, such as molds and pollens.  Food allergies.  Drug allergies.  Insect allergies.  Animal dander allergies. WHAT ARE SYMPTOMS OF ALLERGIES? Possible allergy symptoms include:  Swelling of the lips, face, tongue, mouth, or throat.  Sneezing, coughing, or wheezing.  Nasal congestion.  Tingling in the mouth.  Rash.  Itching.  Itchy, red, swollen areas of skin (hives).  Watery eyes.  Vomiting.  Diarrhea.  Dizziness.  Lightheadedness.  Fainting.  Trouble breathing or swallowing.  Chest tightness.  Rapid heartbeat. HOW ARE ALLERGIES DIAGNOSED? Allergies are diagnosed with a medical and family history and one or more of the following:  Skin tests.  Blood tests.  A food diary. A food diary is a record of all the foods and drinks you have in a day and of all the symptoms you experience.  The results of an elimination diet. An elimination diet involves  eliminating foods from your diet and then adding them back in one by one to find out if a certain food causes an allergic reaction. HOW ARE ALLERGIES TREATED? There is no cure for allergies, but allergic reactions can be treated with medicine. Severe reactions usually need to be treated at a hospital. HOW CAN REACTIONS BE PREVENTED? The best way to prevent an allergic reaction is by avoiding the substance you are allergic to. Allergy shots and medicines can also help prevent reactions in some cases. People with severe allergic reactions may be able to prevent a life-threatening reaction called anaphylaxis with a medicine given right after exposure to the allergen.   This information is not intended to replace advice given to you by your health care provider. Make sure you discuss any questions you have with your health care provider.   Document Released: 07/01/2002 Document Revised: 04/28/2014 Document Reviewed: 01/17/2014 Elsevier Interactive Patient Education [employer]2016 [employer]

## 2015-08-19 NOTE — ED Notes (Signed)
Pt made aware to return if symptoms worsen or if any life threatening symptoms occur.   

## 2015-08-19 NOTE — ED Provider Notes (Signed)
CSN: [id number]     Arrival date & time 08/19/15  1225 History  By signing my name below, I, Ashley Simon, attest that this documentation has been prepared under the direction and in the presence of Ivery QualeHobson Brooksie Ellwanger, PA-C. Electronically Signed: Evon Slackerrance Simon, ED Scribe. 08/19/2015. 2:08 PM.    Chief Complaint  Patient presents with  . Allergies   Patient is a 30 y.o. female presenting with allergic reaction. The history is provided by the patient. No language interpreter was used.  Allergic Reaction Presenting symptoms: itching and swelling   Severity:  Moderate Prior allergic episodes:  Seasonal allergies Relieved by:  Nothing Worsened by:  Nothing tried Ineffective treatments:  None tried  HPI Comments: Ashley Simon is a 30 y.o. female who presents to the Emergency Department complaining of season allergies that recently worsened 1 night prior. Pt reports facial swelling, itching, coughing and sneezing. Pt report that she is allergic to pollen. She states that she parked under a tree which made her symptoms worse. She reports relief from being in cool temperatures. Pt denies fever or other related symptoms.   Past Medical History  Diagnosis Date  . Asthma   . Depression   . Anxiety   . Migraine   . Hx of chlamydia infection   . Abnormal Pap smear   . BV (bacterial vaginosis) 09/07/2012  . Vaginal discharge 12/01/2012    +clue cells will rx metrogel and get GC/CHL  . Postpartum care and examination 06/29/2015  . 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 Vaginal odor 06/29/2015  . Chronic headaches 06/29/2015  . Contraceptive management 06/29/2015   Past Surgical History  Procedure Laterality Date  . No past surgeries     Family History  Problem Relation Age of Onset  . Hypertension Paternal Grandfather   . Diabetes Paternal Grandfather   . Hypertension Paternal Grandmother   . Diabetes Paternal Grandmother   . Cancer Paternal Grandmother     breast  . Hypertension Maternal  Grandmother   . Coronary artery disease Maternal Grandfather   . Hypertension Maternal Grandfather   . Cancer Maternal Grandfather     colon  . Heart disease Maternal Grandfather   . Diabetes Father   . Asthma Mother   . Cancer Mother     breast  . Cancer Maternal Aunt     breast and ovarian  . Epilepsy Daughter   . Other Daughter     iron def   Social History  Substance Use Topics  . Smoking status: Current Every Day Smoker -- 1.00 packs/day for 2 years    Types: Cigarettes  . Smokeless tobacco: Never Used  . Alcohol Use: Yes     Comment: wine    OB History    Gravida Para Term Preterm AB TAB SAB Ectopic Multiple Living   4 4 3 1      0 4     Review of Systems  Constitutional: Negative for fever.  Skin: Positive for itching.  All other systems reviewed and are negative.    Allergies  Calcium-containing compounds  Home Medications   Prior to Admission medications   Medication Sig Start Date End Date Taking? Authorizing Provider  albuterol (PROVENTIL HFA;VENTOLIN HFA) 108 (90 BASE) MCG/ACT inhaler Inhale 1-2 puffs into the lungs every 6 (six) hours as needed for wheezing or shortness of breath. For shortness of breath 01/09/15  Yes Cheral MarkerKimberly R Booker, CNM  escitalopram (LEXAPRO) 10 MG tablet Take 10 mg by mouth daily.   Yes  Historical Provider, MD  medroxyPROGESTERone (DEPO-PROVERA) 150 MG/ML injection Inject 1 mL (150 mg total) into the muscle every 3 (three) months. 06/29/15  Yes Adline Potter, NP   BP 119/78 mmHg  Pulse 98  Temp(Src) 98.1 F (36.7 C) (Oral)  Resp 18  Ht  (1.575 m)  Wt 134 lb (60.782 kg)  BMI 24.50 kg/m2  SpO2 100%   Physical Exam  Constitutional: She is oriented to person, place, and time. She appears well-developed and well-nourished. No distress.  HENT:  Head: Normocephalic and atraumatic.  Mouth/Throat: Oropharynx is clear and moist.  Nasal congestion. Air way patent. No swelling of tongue or soft palate   Eyes: Conjunctivae  and EOM are normal.  Mild puffiness of upper and lower lids.   Neck: Neck supple. No tracheal deviation present.  Cardiovascular: Normal rate.   Pulmonary/Chest: Effort normal and breath sounds normal. No respiratory distress. She has no wheezes. She has no rales.  Musculoskeletal: Normal range of motion. She exhibits no edema.  Neurological: She is alert and oriented to person, place, and time.  Skin: Skin is warm and dry.  Psychiatric: She has a normal mood and affect. Her behavior is normal.  Nursing note and vitals reviewed.   ED Course  Procedures (including critical care time) DIAGNOSTIC STUDIES: Oxygen Saturation is 100% on RA, normal by my interpretation.    COORDINATION OF CARE: 2:09 PM-Discussed treatment plan which includes decadron Claritin d and follow up with allergy specialist with pt at bedside and pt agreed to plan.    Labs Review Labs Reviewed - No data to display  Imaging Review No results found.    EKG Interpretation None      MDM Airway patent. Pt speaks in complete sentences. Exam favors seasonal allergies. Plan: Rx for decadron given. Pt to use claritin D and see Dr Willa Rough, allergy specialist.    Final diagnoses:  Seasonal allergies      **I personally performed the services described in this documentation, which was scribed in my presence. The recorded information has been reviewed and is accurate. I have reviewed nursing notes, vital signs, and all appropriate lab and imaging results for this patient.Ivery Quale, PA-C 08/22/15 1135  Eber Hong, MD 08/23/15 [phone]

## 2015-08-19 NOTE — ED Notes (Signed)
Patient c/o seasonal allergies. Patient c/o sinus pressure with sneezing, eyes watering and itching. Patient also reports itching in throat x2 weeks.

## 2015-09-18 ENCOUNTER — Encounter: Payer: Self-pay | Admitting: *Deleted

## 2015-09-18 ENCOUNTER — Other Ambulatory Visit: Payer: Self-pay | Admitting: *Deleted

## 2015-09-18 ENCOUNTER — Ambulatory Visit (INDEPENDENT_AMBULATORY_CARE_PROVIDER_SITE_OTHER): Payer: Medicaid Other | Admitting: *Deleted

## 2015-09-18 DIAGNOSIS — Z3202 Encounter for pregnancy test, result negative: Secondary | ICD-10-CM

## 2015-09-18 DIAGNOSIS — Z3042 Encounter for surveillance of injectable contraceptive: Secondary | ICD-10-CM | POA: Diagnosis not present

## 2015-09-18 LAB — POCT URINE PREGNANCY: PREG TEST UR: NEGATIVE

## 2015-09-18 MED ORDER — LORATADINE-PSEUDOEPHEDRINE ER 5-120 MG PO TB12: 1.0000 | ORAL_TABLET | Freq: Two times a day (BID) | ORAL | Status: DC

## 2015-09-18 MED ORDER — ESCITALOPRAM OXALATE 10 MG PO TABS: 10.0000 mg | ORAL_TABLET | ORAL | Status: DC | PRN

## 2015-09-18 MED ORDER — MEDROXYPROGESTERONE ACETATE 150 MG/ML IM SUSP
150.0000 mg | Freq: Once | INTRAMUSCULAR | Status: AC
Start: 2015-09-18 — End: 2015-09-18
  Administered 2015-09-18: 150 mg via INTRAMUSCULAR

## 2015-09-18 NOTE — Progress Notes (Signed)
Pt here for Depo. Pt tolerated shot well. Return in 12 weeks for next shot. JSY 

## 2015-09-25 ENCOUNTER — Ambulatory Visit: Payer: Medicaid Other

## 2015-11-21 ENCOUNTER — Ambulatory Visit: Payer: Medicaid Other | Admitting: Advanced Practice Midwife

## 2015-12-11 ENCOUNTER — Encounter: Payer: Self-pay | Admitting: *Deleted

## 2015-12-11 ENCOUNTER — Ambulatory Visit: Payer: Medicaid Other

## 2015-12-15 ENCOUNTER — Encounter (HOSPITAL_COMMUNITY): Payer: Self-pay | Admitting: Emergency Medicine

## 2015-12-15 ENCOUNTER — Emergency Department (HOSPITAL_COMMUNITY)
Admission: EM | Admit: 2015-12-15 | Discharge: 2015-12-15 | Disposition: A | Discharge status: Home / Self Care | Payer: Medicaid Other | Attending: Emergency Medicine | Admitting: Emergency Medicine

## 2015-12-15 ENCOUNTER — Emergency Department (HOSPITAL_COMMUNITY): Payer: Medicaid Other

## 2015-12-15 DIAGNOSIS — R079 Chest pain, unspecified: Secondary | ICD-10-CM | POA: Insufficient documentation

## 2015-12-15 DIAGNOSIS — J45909 Unspecified asthma, uncomplicated: Secondary | ICD-10-CM | POA: Diagnosis not present

## 2015-12-15 DIAGNOSIS — F17210 Nicotine dependence, cigarettes, uncomplicated: Secondary | ICD-10-CM | POA: Diagnosis not present

## 2015-12-15 DIAGNOSIS — Z79899 Other long term (current) drug therapy: Secondary | ICD-10-CM | POA: Diagnosis not present

## 2015-12-15 MED ORDER — ALBUTEROL SULFATE (2.5 MG/3ML) 0.083% IN NEBU
2.5000 mg | INHALATION_SOLUTION | Freq: Once | RESPIRATORY_TRACT | Status: AC
  Administered 2015-12-15: 2.5 mg via RESPIRATORY_TRACT
  Filled 2015-12-15: qty 3

## 2015-12-15 MED ORDER — OXYCODONE-ACETAMINOPHEN 5-325 MG PO TABS
1.0000 | ORAL_TABLET | Freq: Once | ORAL | Status: AC
  Administered 2015-12-15: 1 via ORAL
  Filled 2015-12-15: qty 1

## 2015-12-15 MED ORDER — IBUPROFEN 400 MG PO TABS
600.0000 mg | ORAL_TABLET | Freq: Once | ORAL | Status: AC
  Administered 2015-12-15: 600 mg via ORAL
  Filled 2015-12-15: qty 2

## 2015-12-15 MED ORDER — LORAZEPAM 1 MG PO TABS
1.0000 mg | ORAL_TABLET | Freq: Once | ORAL | Status: AC
  Administered 2015-12-15: 1 mg via ORAL
  Filled 2015-12-15: qty 1

## 2015-12-15 NOTE — ED Provider Notes (Signed)
[facility]-EM[facility]ENCY DEPT Provider Note   CSN: [id number] Arrival date & time: 12/15/15  1549     History   Chief Complaint Chief Complaint  Patient presents with  . Chest Pain    HPI Ashley Simon is a 30 y.o. female.  HPI   30 year old female with chest pain and shortness of breath. Onset last night. She feels like she is having an asthma attack. She appears to be visibly upset. Crying and sobbing at times. Difficult to give a history from her and she is given a lot of reassurance and encouragement. She says she has been having some left-sided chest and lateral thoracic back pain since last night as well. She has a hard time describing it otherwise. She could not tell me any significant factors that seem to make it better or worse. She denies any fever. No unusual leg pain or swelling.  Past Medical History:  Diagnosis Date  . Abnormal Pap smear   . Anxiety   . Asthma   . BV (bacterial vaginosis) 09/07/2012  . Chronic headaches 06/29/2015  . Contraceptive management 06/29/2015  . Depression   . Hx of chlamydia infection   . Migraine   . Postpartum care and examination 06/29/2015  . 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 Vaginal discharge 12/01/2012   +clue cells will rx metrogel and get GC/CHL  . Vaginal odor 06/29/2015    Patient Active Problem List   Diagnosis Date Noted  . Postpartum care and examination 06/29/2015  . 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 Vaginal odor 06/29/2015  . Chronic headaches 06/29/2015  . Contraceptive management 06/29/2015  . Active labor at term 05/17/2015  . Trichomonal cervicitis 05/15/2015  . Encounter for contraceptive management 04/26/2015  . Threatened preterm labor 04/15/2015  . Supervision of normal pregnancy 01/09/2015  . Smoker 01/09/2015  . H/O Depression with anxiety 01/09/2015  . BV (bacterial vaginosis) 09/07/2012  . Asthma 09/06/2012  . Hx of chlamydia infection 09/06/2012  . Abnormal pap 09/06/2012  . Post partum depression  09/06/2012    Past Surgical History:  Procedure Laterality Date  . NO PAST SURGERIES      OB History    Gravida Para Term Preterm AB Living   4 4 3 1   4    SAB TAB Ectopic Multiple Live Births         0 4       Home Medications    Prior to Admission medications   Medication Sig Start Date End Date Taking? Authorizing Provider  albuterol (PROVENTIL HFA;VENTOLIN HFA) 108 (90 BASE) MCG/ACT inhaler Inhale 1-2 puffs into the lungs every 6 (six) hours as needed for wheezing or shortness of breath. For shortness of breath 01/09/15  Yes Cheral MarkerKimberly R Booker, CNM  medroxyPROGESTERone (DEPO-PROVERA) 150 MG/ML injection Inject 1 mL (150 mg total) into the muscle every 3 (three) months. 06/29/15  Yes Adline PotterJennifer A Griffin, NP  dexamethasone (DECADRON) 4 MG tablet Take 1 tablet (4 mg total) by mouth 2 (two) times daily with a meal. Patient not taking: Reported on 12/15/2015 08/19/15   Ivery QualeHobson Bryant, PA-C  diphenhydrAMINE (BENADRYL) 25 MG tablet 1 at hs for itching and sneezing. Patient not taking: Reported on 12/15/2015 08/19/15   Ivery QualeHobson Bryant, PA-C  escitalopram (LEXAPRO) 10 MG tablet Take 1 tablet (10 mg total) by mouth as needed. Patient not taking: Reported on 12/15/2015 09/18/15   Jacklyn ShellFrances Cresenzo-Dishmon, CNM  loratadine-pseudoephedrine (CLARITIN-D 12 HOUR) 5-120 MG tablet Take 1 tablet by mouth 2 (two) times daily. Patient  not taking: Reported on 12/15/2015 09/18/15   Jacklyn Shell, CNM    Family History Family History  Problem Relation Age of Onset  . Hypertension Paternal Grandfather   . Diabetes Paternal Grandfather   . Hypertension Paternal Grandmother   . Diabetes Paternal Grandmother   . Cancer Paternal Grandmother     breast  . Hypertension Maternal Grandmother   . Coronary artery disease Maternal Grandfather   . Hypertension Maternal Grandfather   . Cancer Maternal Grandfather     colon  . Heart disease Maternal Grandfather   . Diabetes Father   . Asthma Mother   .  Cancer Mother     breast  . Epilepsy Daughter   . Other Daughter     iron def  . Cancer Maternal Aunt     breast and ovarian    Social History Social History  Substance Use Topics  . Smoking status: Current Every Day Smoker    Packs/day: 1.00    Years: 2.00    Types: Cigarettes  . Smokeless tobacco: Never Used  . Alcohol use Yes     Comment: wine      Allergies   Calcium-containing compounds   Review of Systems Review of Systems  All systems reviewed and negative, other than as noted in HPI.  Physical Exam Updated Vital Signs BP 127/79 (BP Location: Right Arm)   Pulse 93   Temp 97.7 F (36.5 C) (Oral)   Resp 24   Ht 5\' 2"  (1.575 m)   Wt 140 lb (63.5 kg)   SpO2 100%   BMI 25.61 kg/m   Physical Exam  Constitutional: She appears well-developed and well-nourished. No distress.  Laying on her side curled up in a fetal position. Crying.  HENT:  Head: Normocephalic and atraumatic.  Eyes: Conjunctivae are normal.  Neck: Neck supple.  Cardiovascular: Normal rate and regular rhythm.   No murmur heard. Pulmonary/Chest: Effort normal and breath sounds normal. No respiratory distress.  She is somewhat tachypnea but she is also crying and emotionally upset. Her breathing slows when she is calmer and provided reassurance. She is moving good air. Lung sounds are clear.  Abdominal: Soft. There is no tenderness.  Musculoskeletal: She exhibits no edema.  Lower extremities symmetric as compared to each other. No calf tenderness. Negative Homan's. No palpable cords.   Neurological: She is alert.  Skin: Skin is warm and dry.  Psychiatric: She has a normal mood and affect.  Nursing note and vitals reviewed.    ED Treatments / Results  Labs (all labs ordered are listed, but only abnormal results are displayed) Labs Reviewed - No data to display  EKG  EKG Interpretation  Date/Time:  Saturday December 15 2015 16:02:50 EDT Ventricular Rate:  92 PR Interval:    QRS  Duration: 64 QT Interval:  320 QTC Calculation: 396 R Axis:   78 Text Interpretation:  Sinus rhythm ----------unconfirmed---------- Confirmed by OVERREAD, NOT ([id number]), editor PEARSON, BARBARA ([id number]) on 12/17/2015 11:42:44 AM       Radiology No results found.   Dg Chest 2 View  Result Date: 12/15/2015 CLINICAL DATA:  As the attack last night with severe back and torso pain since then. EXAM: CHEST  2 VIEW COMPARISON:  07/09/2015. FINDINGS: The lungs are clear wiithout focal pneumonia, edema, pneumothorax or pleural effusion. The cardiopericardial silhouette is within normal limits for size. The visualized bony structures of the thorax are intact. Telemetry leads overlie the chest. IMPRESSION: No active cardiopulmonary disease. Electronically Signed  By: Kennith Center M.D.   On: 12/15/2015 17:34    Procedures Procedures (including critical care time)  Medications Ordered in ED Medications  LORazepam (ATIVAN) tablet 1 mg (1 mg Oral Given 12/15/15 1815)  ibuprofen (ADVIL,MOTRIN) tablet 600 mg (600 mg Oral Given 12/15/15 1815)  oxyCODONE-acetaminophen (PERCOCET/ROXICET) 5-325 MG per tablet 1 tablet (1 tablet Oral Given 12/15/15 1815)  albuterol (PROVENTIL) (2.5 MG/3ML) 0.083% nebulizer solution 2.5 mg (2.5 mg Nebulization Given 12/15/15 1903)     Initial Impression / Assessment and Plan / ED Course  I have reviewed the triage vital signs and the nursing notes.  Pertinent labs & imaging results that were available during my care of the patient were reviewed by me and considered in my medical decision making (see chart for details).  Clinical Course    30 year old female with chest pain and dyspnea. Extremely atypical. Patient is crying and generally seems extremely anxious/emotional. She is complaining of an asthma exacerbation although her lungs are clear on exam. She was given an albuterol treatment primarily because she requested it and I felt may give her some reassurance. She is  hemodynamically stable. Her chest x-ray is clear. I tried to address why she seemed to be so emotional but I was unable to make any significant headway. Tried to provide her with reassurance. Return precautions were discussed.. I'm generally hesitant to attribute a patient's symptoms to anxiety because not feeling well from an underlying condition often makes people anxious to some degree. In her case though I feel that anxiety is playing a significant component. My suspicion for emergent process is low and I feel she is appropriate for discharge.  Final Clinical Impressions(s) / ED Diagnoses   Final diagnoses:  Chest pain, unspecified chest pain type    New Prescriptions Discharge Medication List as of 12/15/2015  6:43 PM       Raeford Razor, MD 12/30/15 [phone]

## 2015-12-15 NOTE — ED Triage Notes (Addendum)
Pt states she had an asthma attack last night and has been having severe back/torso pain since then.  Pt crying and hyperventilating at triage.  States she is also coughing. Is out of medications.

## 2016-03-05 ENCOUNTER — Encounter: Payer: Self-pay | Admitting: Emergency Medicine

## 2016-03-05 ENCOUNTER — Emergency Department
Admission: EM | Admit: 2016-03-05 | Discharge: 2016-03-05 | Disposition: A | Discharge status: Home / Self Care | Payer: Medicaid Other | Attending: Emergency Medicine | Admitting: Emergency Medicine

## 2016-03-05 DIAGNOSIS — Z79899 Other long term (current) drug therapy: Secondary | ICD-10-CM | POA: Diagnosis not present

## 2016-03-05 DIAGNOSIS — F17210 Nicotine dependence, cigarettes, uncomplicated: Secondary | ICD-10-CM | POA: Insufficient documentation

## 2016-03-05 DIAGNOSIS — F418 Other specified anxiety disorders: Secondary | ICD-10-CM | POA: Insufficient documentation

## 2016-03-05 DIAGNOSIS — J069 Acute upper respiratory infection, unspecified: Secondary | ICD-10-CM | POA: Diagnosis not present

## 2016-03-05 DIAGNOSIS — R05 Cough: Secondary | ICD-10-CM | POA: Diagnosis present

## 2016-03-05 DIAGNOSIS — J9801 Acute bronchospasm: Secondary | ICD-10-CM | POA: Diagnosis not present

## 2016-03-05 MED ORDER — ALBUTEROL SULFATE HFA 108 (90 BASE) MCG/ACT IN AERS: 2.0000 | INHALATION_SPRAY | Freq: Four times a day (QID) | RESPIRATORY_TRACT | 2 refills | Status: DC | PRN

## 2016-03-05 MED ORDER — PSEUDOEPH-BROMPHEN-DM 30-2-10 MG/5ML PO SYRP: 5.0000 mL | ORAL_SOLUTION | Freq: Four times a day (QID) | ORAL | 0 refills | Status: DC | PRN

## 2016-03-05 MED ORDER — METHYLPREDNISOLONE 4 MG PO TBPK: ORAL_TABLET | ORAL | 0 refills | Status: DC

## 2016-03-05 NOTE — ED Triage Notes (Signed)
Patient ambulatory to triage with steady gait, without difficulty or distress noted; pt reports cough & congestion x 2weeks; here with 2 children to be seen for same symptoms

## 2016-03-05 NOTE — ED Provider Notes (Signed)
[facility] Emergency Department Provider Note   ____________________________________________   None    (approximate)  I have reviewed the triage vital signs and the nursing notes.   HISTORY  Chief Complaint Cough and Nasal Congestion    HPI Ashley Simon is a 30 y.o. female patient complaining of cough with nasal and chest congestion for 2 weeks. Patient state her chills and also had same complaints. Patient denies nausea vomiting or diarrhea. Patient states she has coughing spells which causes wheezing. Patient has a history of asthma but does not currently have an inhaler. Patient rates her pain discomfort as 8/10. Patient describes her pain as "achy". No palliative measures for this complaint.  Past Medical History:  Diagnosis Date  . Abnormal Pap smear   . Anxiety   . Asthma   . BV (bacterial vaginosis) 09/07/2012  . Chronic headaches 06/29/2015  . Contraceptive management 06/29/2015  . Depression   . Hx of chlamydia infection   . Migraine   . Postpartum care and examination 06/29/2015  . 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 Vaginal discharge 12/01/2012   +clue cells will rx metrogel and get GC/CHL  . Vaginal odor 06/29/2015    Patient Active Problem List   Diagnosis Date Noted  . Postpartum care and examination 06/29/2015  . 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 Vaginal odor 06/29/2015  . Chronic headaches 06/29/2015  . Contraceptive management 06/29/2015  . Active labor at term 05/17/2015  . Trichomonal cervicitis 05/15/2015  . Encounter for contraceptive management 04/26/2015  . Threatened preterm labor 04/15/2015  . Supervision of normal pregnancy 01/09/2015  . Smoker 01/09/2015  . H/O Depression with anxiety 01/09/2015  . BV (bacterial vaginosis) 09/07/2012  . Asthma 09/06/2012  . Hx of chlamydia infection 09/06/2012  . Abnormal pap 09/06/2012  . Post partum depression 09/06/2012    Past Surgical History:  Procedure Laterality  Date  . NO PAST SURGERIES      Prior to Admission medications   Medication Sig Start Date End Date Taking? Authorizing Provider  albuterol (PROVENTIL HFA;VENTOLIN HFA) 108 (90 BASE) MCG/ACT inhaler Inhale 1-2 puffs into the lungs every 6 (six) hours as needed for wheezing or shortness of breath. For shortness of breath 01/09/15   Cheral Marker, CNM  albuterol (PROVENTIL HFA;VENTOLIN HFA) 108 (90 Base) MCG/ACT inhaler Inhale 2 puffs into the lungs every 6 (six) hours as needed for wheezing or shortness of breath. 03/05/16   Joni Reining, PA-C  brompheniramine-pseudoephedrine-DM 30-2-10 MG/5ML syrup Take 5 mLs by mouth 4 (four) times daily as needed. 03/05/16   Joni Reining, PA-C  dexamethasone (DECADRON) 4 MG tablet Take 1 tablet (4 mg total) by mouth 2 (two) times daily with a meal. Patient not taking: Reported on 12/15/2015 08/19/15   Ivery Quale, PA-C  diphenhydrAMINE (BENADRYL) 25 MG tablet 1 at hs for itching and sneezing. Patient not taking: Reported on 12/15/2015 08/19/15   Ivery Quale, PA-C  escitalopram (LEXAPRO) 10 MG tablet Take 1 tablet (10 mg total) by mouth as needed. Patient not taking: Reported on 12/15/2015 09/18/15   Jacklyn Shell, CNM  loratadine-pseudoephedrine (CLARITIN-D 12 HOUR) 5-120 MG tablet Take 1 tablet by mouth 2 (two) times daily. Patient not taking: Reported on 12/15/2015 09/18/15   Jacklyn Shell, CNM  medroxyPROGESTERone (DEPO-PROVERA) 150 MG/ML injection Inject 1 mL (150 mg total) into the muscle every 3 (three) months. 06/29/15   Adline Potter, NP  methylPREDNISolone (MEDROL DOSEPAK) 4 MG TBPK tablet Take Tapered dose  as directed 03/05/16   Joni Reiningonald K Aamari West, PA-C    Allergies Calcium-containing compounds  Family History  Problem Relation Age of Onset  . Hypertension Paternal Grandfather   . Diabetes Paternal Grandfather   . Hypertension Paternal Grandmother   . Diabetes Paternal Grandmother   . Cancer Paternal Grandmother      breast  . Hypertension Maternal Grandmother   . Coronary artery disease Maternal Grandfather   . Hypertension Maternal Grandfather   . Cancer Maternal Grandfather     colon  . Heart disease Maternal Grandfather   . Diabetes Father   . Asthma Mother   . Cancer Mother     breast  . Epilepsy Daughter   . Other Daughter     iron def  . Cancer Maternal Aunt     breast and ovarian    Social History Social History  Substance Use Topics  . Smoking status: Current Every Day Smoker    Packs/day: 1.00    Years: 2.00    Types: Cigarettes  . Smokeless tobacco: Never Used  . Alcohol use Yes     Comment: wine     Review of Systems Constitutional: No fever/chills Eyes: No visual changes. ENT: No sore throat. Cardiovascular: Denies chest pain. Respiratory: Denies shortness of breath. Gastrointestinal: No abdominal pain.  No nausea, no vomiting.  No diarrhea.  No constipation. Genitourinary: Negative for dysuria. Musculoskeletal: Negative for back pain. Skin: Negative for rash. Neurological: Negative for headaches, focal weakness or numbness. Psychiatric:Anxiety and depression   ____________________________________________   PHYSICAL EXAM:  VITAL SIGNS: ED Triage Vitals  Enc Vitals Group     BP 03/05/16 0654 (!) 100/50     Pulse Rate 03/05/16 0654 67     Resp 03/05/16 0654 20     Temp 03/05/16 0654 97.6 F (36.4 C)     Temp Source 03/05/16 0654 Oral     SpO2 03/05/16 0654 99 %     Weight 03/05/16 0647 125 lb (56.7 kg)     Height 03/05/16 0647 5\' 2"  (1.575 m)     Head Circumference --      Peak Flow --      Pain Score 03/05/16 0718 8     Pain Loc --      Pain Edu? --      Excl. in GC? --     Constitutional: Alert and oriented. Well appearing and in no acute distress. Eyes: Conjunctivae are normal. PERRL. EOMI. Head: Atraumatic. Nose: Edematous nasal turbinates with clear rhinorrhea Mouth/Throat: Mucous membranes are moist.  Oropharynx non-erythematous. Copious  postnasal drainage Neck: No stridor.  No cervical spine tenderness to palpation. Hematological/Lymphatic/Immunilogical: No cervical lymphadenopathy. Cardiovascular: Normal rate, regular rhythm. Grossly normal heart sounds.  Good peripheral circulation. Respiratory: Normal respiratory effort.  No retractions. Lungs CTAB. Mild wheezing Gastrointestinal: Soft and nontender. No distention. No abdominal bruits. No CVA tenderness. Musculoskeletal: No lower extremity tenderness nor edema.  No joint effusions. Neurologic:  Normal speech and language. No gross focal neurologic deficits are appreciated. No gait instability. Skin:  Skin is warm, dry and intact. No rash noted. Psychiatric: Mood and affect are normal. Speech and behavior are normal.  ____________________________________________   LABS (all labs ordered are listed, but only abnormal results are displayed)  Labs Reviewed - No data to display ____________________________________________  EKG   ____________________________________________  RADIOLOGY   ____________________________________________   PROCEDURES  Procedure(s) performed: None  Procedures  Critical Care performed: No  ____________________________________________   INITIAL IMPRESSION / ASSESSMENT  AND PLAN / ED COURSE  Pertinent labs & imaging results that were available during my care of the patient were reviewed by me and considered in my medical decision making (see chart for details).  Cough secondary to bronchospasm. Patient given discharge care instructions. Patient given prescription for Medrol Dosepak, Bromfed-DM, and Proventil inhaler. Follow-up family doctor as needed.  Clinical Course      ____________________________________________   FINAL CLINICAL IMPRESSION(S) / ED DIAGNOSES  Final diagnoses:  Cough due to bronchospasm  URI with cough and congestion      NEW MEDICATIONS STARTED DURING THIS VISIT:  New Prescriptions   ALBUTEROL  (PROVENTIL HFA;VENTOLIN HFA) 108 (90 BASE) MCG/ACT INHALER    Inhale 2 puffs into the lungs every 6 (six) hours as needed for wheezing or shortness of breath.   BROMPHENIRAMINE-PSEUDOEPHEDRINE-DM 30-2-10 MG/5ML SYRUP    Take 5 mLs by mouth 4 (four) times daily as needed.   METHYLPREDNISOLONE (MEDROL DOSEPAK) 4 MG TBPK TABLET    Take Tapered dose as directed     Note:  This document was prepared using Dragon voice recognition software and may include unintentional dictation errors.    Joni Reiningonald K Chandria Rookstool, PA-C 03/05/16 [phone]    Nita Sicklearolina Veronese, MD 03/06/16 [phone]

## 2016-08-14 ENCOUNTER — Encounter: Payer: Self-pay | Admitting: Medical Oncology

## 2016-08-14 ENCOUNTER — Emergency Department
Admission: EM | Admit: 2016-08-14 | Discharge: 2016-08-14 | Disposition: A | Discharge status: Home / Self Care | Payer: Medicaid Other | Attending: Emergency Medicine | Admitting: Emergency Medicine

## 2016-08-14 DIAGNOSIS — K0889 Other specified disorders of teeth and supporting structures: Secondary | ICD-10-CM

## 2016-08-14 DIAGNOSIS — F17210 Nicotine dependence, cigarettes, uncomplicated: Secondary | ICD-10-CM | POA: Insufficient documentation

## 2016-08-14 DIAGNOSIS — K029 Dental caries, unspecified: Secondary | ICD-10-CM | POA: Diagnosis not present

## 2016-08-14 DIAGNOSIS — J45909 Unspecified asthma, uncomplicated: Secondary | ICD-10-CM | POA: Insufficient documentation

## 2016-08-14 MED ORDER — NAPROXEN 500 MG PO TABS: 500.0000 mg | ORAL_TABLET | Freq: Two times a day (BID) | ORAL | 0 refills | Status: DC

## 2016-08-14 MED ORDER — AMOXICILLIN 500 MG PO TABS: 500.0000 mg | ORAL_TABLET | Freq: Three times a day (TID) | ORAL | 0 refills | Status: DC

## 2016-08-14 NOTE — ED Triage Notes (Signed)
Left upper dental pain

## 2016-08-14 NOTE — Discharge Instructions (Signed)
OPTIONS FOR DENTAL FOLLOW UP CARE ° °[place] [facility] and [facility] - Local Safety Net Dental Clinics °http://www.ncdhhs.gov/dph/oralhealth/services/safetynetclinics.htm °  °[facility] (336-562-3123) ° °[facility] [place] ([phone]) ° °[facility] ([phone] ext [phone]) ° °[facility] ([phone]) ° °[facility] ([phone]) °This clinic caters to the indigent population and is on a lottery system. °Location: °[facility], [address], [place] °Clinic Hours: °Wednesdays from 6pm - 9pm, patients seen by a lottery system. °For dates, call or go to www.med.[facility].edu/[facility]/patients/Dental-[facility] °Services: °Cleanings, fillings and simple extractions. °Payment Options: °DENTAL WORK IS FREE OF CHARGE. Bring proof of income or support. °Best way to get seen: °Arrive at 5:15 pm - this is a lottery, NOT first come/first serve, so arriving earlier will not increase your chances of being seen. °  °  °[facility] °[phone] °Select option 1 for emergencies °  °Location: °[facility], [address], [place] °Clinic Hours: °No walk-ins accepted - call the day before to schedule an appointment. °Check in times are 9:30 am and 1:30 pm. °Services: °Simple extractions, temporary fillings, pulpectomy/pulp debridement, uncomplicated abscess drainage. °Payment Options: °PAYMENT IS DUE AT THE TIME OF SERVICE.  Fee is usually $100-200, additional surgical procedures (e.g. abscess drainage) may be extra. °Cash, checks, Visa/MasterCard accepted.  Can file Medicaid if patient is covered for dental - patient should call case worker to check. °No discount for [facility] patients. °Best way to get seen: °MUST call the day before and get onto the schedule. Can usually be seen the next 1-2 days. No walk-ins accepted. °  °  °[facility] °[phone] °   °Location: °[facility], [address], [place] °Clinic Hours: °M, W, Th, F 8am or 1:30pm, Tues 9a or 1:30 - first come/first served. °Services: °Simple extractions, temporary fillings, uncomplicated abscess drainage.  You do not need to be an [place] resident. °Payment Options: °PAYMENT IS DUE AT THE TIME OF SERVICE. °Dental insurance, otherwise sliding scale - bring proof of income or support. °Depending on income and treatment needed, cost is usually $50-200. °Best way to get seen: °Arrive early as it is first come/first served. °  °  °[facility] °[phone] °  °Location: °[address] °Clinic Hours: °Mon-Thu 8a-5p °Services: °Most basic dental services including extractions and fillings. °Payment Options: °PAYMENT IS DUE AT THE TIME OF SERVICE. °Sliding scale, up to 50% off - bring proof if income or support. °Medicaid with dental option accepted. °Best way to get seen: °Call to schedule an appointment, can usually be seen within 2 weeks OR they will try to see walk-ins - show up at 8a or 2p (you may have to wait). °  °  °[facility] °919-245-2435 °[place] RESIDENTS ONLY °  °Location: °[facility], [address], [place], [place] [address] °Clinic Hours: By appointment only. °Monday - Thursday 8am-5pm, Friday 8am-12pm °Services: Cleanings, fillings, extractions. °Payment Options: °PAYMENT IS DUE AT THE TIME OF SERVICE. °Cash, Visa or MasterCard. Sliding scale - $30 minimum per service. °Best way to get seen: °Come in to office, complete packet and make an appointment - need proof of income °or support monies for each household member and proof of [place] residence. °Usually takes about a month to get in. °  °  °[facility] °[phone] °  °Location: °[address]   [place] °Clinic Hours: Walk-in Urgent Care Dental Services are offered Monday-Friday  mornings only. °The numbers of emergencies accepted daily is limited to the number of °providers available. °Maximum 15 - Mondays, Wednesdays & Thursdays °Maximum 10 - Tuesdays & Fridays °Services: °You do not need to be a [place] resident to be seen for a dental emergency. °Emergencies are defined as pain, swelling, abnormal bleeding, or dental trauma. Walkins will receive x-rays if needed. °NOTE: Dental cleaning is not an emergency. °Payment Options: °PAYMENT IS DUE AT THE TIME OF SERVICE. °Minimum co-pay is $40.00 for uninsured patients. °Minimum co-pay is $3.00 for Medicaid with dental coverage. °Dental Insurance is accepted and must be presented at time of visit. °Medicare does not cover dental. °Forms of payment: Cash, credit card, checks. °Best way to get seen: °If not previously registered with the clinic, walk-in dental registration begins at 7:15 am and is on a first come/first serve basis. °If previously registered with the clinic, call to make an appointment. °  °  °[facility] °[phone] °[place] RESIDENTS ONLY °  °Location: °[address], [place], [place] °Clinic Hours: °Mon-Thu 10a-2p °Services: Extractions only! °Payment Options: °FREE (donations accepted) - bring proof of income or support °Best way to get seen: °Call and schedule an appointment OR come at 8am on the 1st Monday of every month (except for holidays) when it is first come/first served. °  °  °[facility] °[phone] °  °Location: °[address], [place] °Clinic Hours: °Friday mornings °Services, Payment Options, Best way to get seen: °Call for info °

## 2016-08-15 NOTE — ED Provider Notes (Signed)
[facility] Emergency Department Provider Note ____________________________________________  Time seen: Approximately 4:06 PM  I have reviewed the triage vital signs and the nursing notes.   HISTORY  Chief Complaint Dental Pain   HPI Ashley Simon is a 31 y.o. female who presents to the emergency department for evaluation of dental pain that has been present for the past 3 days. She has not taken any medication for pain relief.    Past Medical History:  Diagnosis Date  . Abnormal Pap smear   . Anxiety   . Asthma   . BV (bacterial vaginosis) 09/07/2012  . Chronic headaches 06/29/2015  . Contraceptive management 06/29/2015  . Depression   . Hx of chlamydia infection   . Migraine   . Postpartum care and examination 06/29/2015  . 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 Vaginal discharge 12/01/2012   +clue cells will rx metrogel and get GC/CHL  . Vaginal odor 06/29/2015    Patient Active Problem List   Diagnosis Date Noted  . Postpartum care and examination 06/29/2015  . 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 Vaginal odor 06/29/2015  . Chronic headaches 06/29/2015  . Contraceptive management 06/29/2015  . Active labor at term 05/17/2015  . Trichomonal cervicitis 05/15/2015  . Encounter for contraceptive management 04/26/2015  . Threatened preterm labor 04/15/2015  . Supervision of normal pregnancy 01/09/2015  . Smoker 01/09/2015  . H/O Depression with anxiety 01/09/2015  . BV (bacterial vaginosis) 09/07/2012  . Asthma 09/06/2012  . Hx of chlamydia infection 09/06/2012  . Abnormal pap 09/06/2012  . Post partum depression 09/06/2012    Past Surgical History:  Procedure Laterality Date  . NO PAST SURGERIES      Prior to Admission medications   Medication Sig Start Date End Date Taking? Authorizing Provider  albuterol (PROVENTIL HFA;VENTOLIN HFA) 108 (90 BASE) MCG/ACT inhaler Inhale 1-2 puffs into the lungs every 6 (six) hours as needed for wheezing or  shortness of breath. For shortness of breath 01/09/15   Cheral Marker, CNM  albuterol (PROVENTIL HFA;VENTOLIN HFA) 108 (90 Base) MCG/ACT inhaler Inhale 2 puffs into the lungs every 6 (six) hours as needed for wheezing or shortness of breath. 03/05/16   Joni Reining, PA-C  amoxicillin (AMOXIL) 500 MG tablet Take 1 tablet (500 mg total) by mouth 3 (three) times daily. 08/14/16   Chinita Pester, FNP  brompheniramine-pseudoephedrine-DM 30-2-10 MG/5ML syrup Take 5 mLs by mouth 4 (four) times daily as needed. 03/05/16   Joni Reining, PA-C  dexamethasone (DECADRON) 4 MG tablet Take 1 tablet (4 mg total) by mouth 2 (two) times daily with a meal. Patient not taking: Reported on 12/15/2015 08/19/15   Ivery Quale, PA-C  diphenhydrAMINE (BENADRYL) 25 MG tablet 1 at hs for itching and sneezing. Patient not taking: Reported on 12/15/2015 08/19/15   Ivery Quale, PA-C  escitalopram (LEXAPRO) 10 MG tablet Take 1 tablet (10 mg total) by mouth as needed. Patient not taking: Reported on 12/15/2015 09/18/15   Jacklyn Shell, CNM  loratadine-pseudoephedrine (CLARITIN-D 12 HOUR) 5-120 MG tablet Take 1 tablet by mouth 2 (two) times daily. Patient not taking: Reported on 12/15/2015 09/18/15   Jacklyn Shell, CNM  medroxyPROGESTERone (DEPO-PROVERA) 150 MG/ML injection Inject 1 mL (150 mg total) into the muscle every 3 (three) months. 06/29/15   Adline Potter, NP  methylPREDNISolone (MEDROL DOSEPAK) 4 MG TBPK tablet Take Tapered dose as directed 03/05/16   Joni Reining, PA-C  naproxen (NAPROSYN) 500 MG tablet Take 1 tablet (500  mg total) by mouth 2 (two) times daily with a meal. 08/14/16   Chinita Pester, FNP    Allergies Calcium-containing compounds  Family History  Problem Relation Age of Onset  . Hypertension Paternal Grandfather   . Diabetes Paternal Grandfather   . Hypertension Paternal Grandmother   . Diabetes Paternal Grandmother   . Cancer Paternal Grandmother     breast  .  Hypertension Maternal Grandmother   . Coronary artery disease Maternal Grandfather   . Hypertension Maternal Grandfather   . Cancer Maternal Grandfather     colon  . Heart disease Maternal Grandfather   . Diabetes Father   . Asthma Mother   . Cancer Mother     breast  . Epilepsy Daughter   . Other Daughter     iron def  . Cancer Maternal Aunt     breast and ovarian    Social History Social History  Substance Use Topics  . Smoking status: Current Every Day Smoker    Packs/day: 1.00    Years: 2.00    Types: Cigarettes  . Smokeless tobacco: Never Used  . Alcohol use Yes     Comment: wine     Review of Systems Constitutional: Well appearing ENT: Positive for dental pain Musculoskeletal: Positive for jaw pain. Negative  for trismus. Skin: Negative for erythema or edema. ____________________________________________   PHYSICAL EXAM:  VITAL SIGNS: ED Triage Vitals [08/14/16 1534]  Enc Vitals Group     BP (!) 98/52     Pulse Rate 79     Resp 18     Temp 98.2 F (36.8 C)     Temp Source Oral     SpO2 100 %     Weight 119 lb (54 kg)     Height  (1.575 m)     Head Circumference      Peak Flow      Pain Score 9     Pain Loc      Pain Edu?      Excl. in GC?     Constitutional: Alert and oriented. Well appearing and in no acute distress. Eyes: Conjunctivae are normal.EOMI. Mouth/Throat: Mucous membranes are moist. Oropharynx non-erythematous. Periodontal Exam    Hematological/Lymphatic/Immunilogical: No cervical lymphadenopathy. Respiratory: Normal respiratory effort.  Musculoskeletal: Full ROM x 4 extremities. Neurologic:  Normal speech and language. No gross focal neurologic deficits are appreciated. Speech is normal. Skin:  No area of fluctuance or gingival edema of the left upper side. Psychiatric: Mood and affect are normal. Speech and behavior are normal.  ____________________________________________   LABS (all labs ordered are listed, but only  abnormal results are displayed)  Labs Reviewed - No data to display ____________________________________________   RADIOLOGY  Not indicated. ____________________________________________   PROCEDURES  Procedure(s) performed: None  Critical Care performed: No ____________________________________________   INITIAL IMPRESSION / ASSESSMENT AND PLAN / ED COURSE  31 year old female presenting to the emergency department for treatment of dental pain. She will receive a prescription for amoxicillin and naprosyn. She is to follow up with the dentist as soon as possible. She is to return to the ER for symptoms that change or worsen if unable to schedule and appointment.   Pertinent labs & imaging results that were available during my care of the patient were reviewed by me and considered in my medical decision making (see chart for details).  ____________________________________________   FINAL CLINICAL IMPRESSION(S) / ED DIAGNOSES  Final diagnoses:  Pain, dental  Discharge Medication List as of 08/14/2016  4:55 PM    START taking these medications   Details  amoxicillin (AMOXIL) 500 MG tablet Take 1 tablet (500 mg total) by mouth 3 (three) times daily., Starting Thu 08/14/2016, Print    naproxen (NAPROSYN) 500 MG tablet Take 1 tablet (500 mg total) by mouth 2 (two) times daily with a meal., Starting Thu 08/14/2016, Print        If controlled substance prescribed during this visit, 12 month history viewed on the [facility] prior to issuing an initial prescription for Schedule II or III opiod.  Note:  This document was prepared using Dragon voice recognition software and may include unintentional dictation errors.     Chinita Pester, FNP 08/15/16 1846    Rockne Menghini, MD 08/19/16 1105

## 2016-08-28 ENCOUNTER — Encounter: Payer: Self-pay | Admitting: Emergency Medicine

## 2016-08-28 ENCOUNTER — Emergency Department
Admission: EM | Admit: 2016-08-28 | Discharge: 2016-08-29 | Disposition: A | Discharge status: Home / Self Care | Payer: Medicaid Other | Attending: Emergency Medicine | Admitting: Emergency Medicine

## 2016-08-28 DIAGNOSIS — Z79899 Other long term (current) drug therapy: Secondary | ICD-10-CM | POA: Insufficient documentation

## 2016-08-28 DIAGNOSIS — K529 Noninfective gastroenteritis and colitis, unspecified: Secondary | ICD-10-CM | POA: Insufficient documentation

## 2016-08-28 DIAGNOSIS — F17210 Nicotine dependence, cigarettes, uncomplicated: Secondary | ICD-10-CM | POA: Insufficient documentation

## 2016-08-28 DIAGNOSIS — J45909 Unspecified asthma, uncomplicated: Secondary | ICD-10-CM | POA: Diagnosis not present

## 2016-08-28 DIAGNOSIS — R1110 Vomiting, unspecified: Secondary | ICD-10-CM | POA: Diagnosis present

## 2016-08-28 LAB — COMPREHENSIVE METABOLIC PANEL WITH GFR
ALT: 24 U/L (ref 14–54)
AST: 29 U/L (ref 15–41)
Albumin: 4 g/dL (ref 3.5–5.0)
Alkaline Phosphatase: 81 U/L (ref 38–126)
Anion gap: 5 (ref 5–15)
BUN: 11 mg/dL (ref 6–20)
CO2: 24 mmol/L (ref 22–32)
Calcium: 8.5 mg/dL — ABNORMAL LOW (ref 8.9–10.3)
Chloride: 108 mmol/L (ref 101–111)
Creatinine, Ser: 0.72 mg/dL (ref 0.44–1.00)
GFR calc Af Amer: >60 mL/min
GFR calc non Af Amer: >60 mL/min
Glucose, Bld: 101 mg/dL — ABNORMAL HIGH (ref 65–99)
Potassium: 3.2 mmol/L — ABNORMAL LOW (ref 3.5–5.1)
Sodium: 137 mmol/L (ref 135–145)
Total Bilirubin: 0.7 mg/dL (ref 0.3–1.2)
Total Protein: 7.5 g/dL (ref 6.5–8.1)

## 2016-08-28 LAB — CBC
HCT: 37.8 % (ref 35.0–47.0)
Hemoglobin: 12.7 g/dL (ref 12.0–16.0)
MCH: 29.6 pg (ref 26.0–34.0)
MCHC: 33.5 g/dL (ref 32.0–36.0)
MCV: 88.3 fL (ref 80.0–100.0)
Platelets: 176 K/uL (ref 150–440)
RBC: 4.28 MIL/uL (ref 3.80–5.20)
RDW: 14.5 % (ref 11.5–14.5)
WBC: 4.6 K/uL (ref 3.6–11.0)

## 2016-08-28 LAB — POCT PREGNANCY, URINE: Preg Test, Ur: NEGATIVE

## 2016-08-28 LAB — URINALYSIS, COMPLETE (UACMP) WITH MICROSCOPIC
Bacteria, UA: NONE SEEN
Bilirubin Urine: NEGATIVE
Glucose, UA: NEGATIVE mg/dL
Hgb urine dipstick: NEGATIVE
Ketones, ur: NEGATIVE mg/dL
Nitrite: NEGATIVE
Protein, ur: 30 mg/dL — AB
Specific Gravity, Urine: 1.031 — ABNORMAL HIGH (ref 1.005–1.030)
pH: 5 (ref 5.0–8.0)

## 2016-08-28 LAB — LIPASE, BLOOD: Lipase: 22 U/L (ref 11–51)

## 2016-08-28 NOTE — ED Triage Notes (Signed)
Pt in with co upper abd pain with n.v.d since yest. States unable to keep food or fluids down, here with 2 children that have same symptoms.

## 2016-08-29 MED ORDER — LOPERAMIDE HCL 2 MG PO CAPS
4.0000 mg | ORAL_CAPSULE | Freq: Once | ORAL | Status: AC
  Administered 2016-08-29: 4 mg via ORAL
  Filled 2016-08-29: qty 2

## 2016-08-29 MED ORDER — SODIUM CHLORIDE 0.9 % IV BOLUS (SEPSIS)
1000.0000 mL | Freq: Once | INTRAVENOUS | Status: AC
  Administered 2016-08-29: 1000 mL via INTRAVENOUS

## 2016-08-29 MED ORDER — ONDANSETRON HCL 4 MG/2ML IJ SOLN
INTRAMUSCULAR | Status: AC
  Filled 2016-08-29: qty 2

## 2016-08-29 MED ORDER — LOPERAMIDE HCL 2 MG PO TABS: 2.0000 mg | ORAL_TABLET | Freq: Four times a day (QID) | ORAL | 0 refills | Status: DC | PRN

## 2016-08-29 MED ORDER — ONDANSETRON 4 MG PO TBDP: 4.0000 mg | ORAL_TABLET | Freq: Three times a day (TID) | ORAL | 0 refills | Status: DC | PRN

## 2016-08-29 MED ORDER — ONDANSETRON HCL 4 MG/2ML IJ SOLN
4.0000 mg | Freq: Once | INTRAMUSCULAR | Status: AC
  Administered 2016-08-29: 4 mg via INTRAVENOUS

## 2016-08-29 NOTE — ED Notes (Signed)
Pt states that last night around 9:00pm she ate [employer] and the kids ate little ce[employer] when suddenly the kids started throwing up and crying. Shortly after helping the kids she herself began throwing up. 2 children at the bedside.

## 2016-08-29 NOTE — ED Provider Notes (Signed)
[facility] Emergency Department Provider Note   First MD Initiated Contact with Patient 08/29/16 0007     (approximate)  I have reviewed the triage vital signs and the nursing notes.   HISTORY  Chief Complaint Abdominal Pain   HPI Ashley Simon is a 31 y.o. female with below list of chronic medical conditions presents to the emergency department with nonbloody vomiting and diarrhea times one day. Patient denies any fever. Of note patient's 2 children are currently in the emergency department being seen for the same. Patient denies any abdominal pain but does admit to dizziness with standing.   Past Medical History:  Diagnosis Date  . Abnormal Pap smear   . Anxiety   . Asthma   . BV (bacterial vaginosis) 09/07/2012  . Chronic headaches 06/29/2015  . Contraceptive management 06/29/2015  . Depression   . Hx of chlamydia infection   . Migraine   . Postpartum care and examination 06/29/2015  . 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 Vaginal discharge 12/01/2012   +clue cells will rx metrogel and get GC/CHL  . Vaginal odor 06/29/2015    Patient Active Problem List   Diagnosis Date Noted  . Postpartum care and examination 06/29/2015  . 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 Vaginal odor 06/29/2015  . Chronic headaches 06/29/2015  . Contraceptive management 06/29/2015  . Active labor at term 05/17/2015  . Trichomonal cervicitis 05/15/2015  . Encounter for contraceptive management 04/26/2015  . Threatened preterm labor 04/15/2015  . Supervision of normal pregnancy 01/09/2015  . Smoker 01/09/2015  . H/O Depression with anxiety 01/09/2015  . BV (bacterial vaginosis) 09/07/2012  . Asthma 09/06/2012  . Hx of chlamydia infection 09/06/2012  . Abnormal pap 09/06/2012  . Post partum depression 09/06/2012    Past Surgical History:  Procedure Laterality Date  . NO PAST SURGERIES      Prior to Admission medications   Medication Sig Start Date End Date  Taking? Authorizing Provider  albuterol (PROVENTIL HFA;VENTOLIN HFA) 108 (90 BASE) MCG/ACT inhaler Inhale 1-2 puffs into the lungs every 6 (six) hours as needed for wheezing or shortness of breath. For shortness of breath 01/09/15   Cheral MarkerBooker, Kimberly R, CNM  albuterol (PROVENTIL HFA;VENTOLIN HFA) 108 (90 Base) MCG/ACT inhaler Inhale 2 puffs into the lungs every 6 (six) hours as needed for wheezing or shortness of breath. 03/05/16   Joni ReiningSmith, Ronald K, PA-C  amoxicillin (AMOXIL) 500 MG tablet Take 1 tablet (500 mg total) by mouth 3 (three) times daily. 08/14/16   Triplett, Rulon Eisenmengerari B, FNP  brompheniramine-pseudoephedrine-DM 30-2-10 MG/5ML syrup Take 5 mLs by mouth 4 (four) times daily as needed. 03/05/16   Joni ReiningSmith, Ronald K, PA-C  dexamethasone (DECADRON) 4 MG tablet Take 1 tablet (4 mg total) by mouth 2 (two) times daily with a meal. Patient not taking: Reported on 12/15/2015 08/19/15   Ivery QualeBryant, Hobson, PA-C  diphenhydrAMINE (BENADRYL) 25 MG tablet 1 at hs for itching and sneezing. Patient not taking: Reported on 12/15/2015 08/19/15   Ivery QualeBryant, Hobson, PA-C  escitalopram (LEXAPRO) 10 MG tablet Take 1 tablet (10 mg total) by mouth as needed. Patient not taking: Reported on 12/15/2015 09/18/15   Cresenzo-Dishmon, Scarlette CalicoFrances, CNM  loratadine-pseudoephedrine (CLARITIN-D 12 HOUR) 5-120 MG tablet Take 1 tablet by mouth 2 (two) times daily. Patient not taking: Reported on 12/15/2015 09/18/15   Cresenzo-Dishmon, Scarlette CalicoFrances, CNM  medroxyPROGESTERone (DEPO-PROVERA) 150 MG/ML injection Inject 1 mL (150 mg total) into the muscle every 3 (three) months. 06/29/15   Adline PotterGriffin, Jennifer A,  NP  methylPREDNISolone (MEDROL DOSEPAK) 4 MG TBPK tablet Take Tapered dose as directed 03/05/16   Joni Reining, PA-C  naproxen (NAPROSYN) 500 MG tablet Take 1 tablet (500 mg total) by mouth 2 (two) times daily with a meal. 08/14/16   Triplett, Cari B, FNP    Allergies Calcium-containing compounds  Family History  Problem Relation Age of Onset  .  Hypertension Paternal Grandfather   . Diabetes Paternal Grandfather   . Hypertension Paternal Grandmother   . Diabetes Paternal Grandmother   . Cancer Paternal Grandmother        breast  . Hypertension Maternal Grandmother   . Coronary artery disease Maternal Grandfather   . Hypertension Maternal Grandfather   . Cancer Maternal Grandfather        colon  . Heart disease Maternal Grandfather   . Diabetes Father   . Asthma Mother   . Cancer Mother        breast  . Epilepsy Daughter   . Other Daughter        iron def  . Cancer Maternal Aunt        breast and ovarian    Social History Social History  Substance Use Topics  . Smoking status: Current Every Day Smoker    Packs/day: 1.00    Years: 2.00    Types: Cigarettes  . Smokeless tobacco: Never Used  . Alcohol use Yes     Comment: wine     Review of Systems Constitutional: No fever/chills Eyes: No visual changes. ENT: No sore throat. Cardiovascular: Denies chest pain. Respiratory: Denies shortness of breath. Gastrointestinal: No abdominal pain. Positive for vomiting and diarrhea Genitourinary: Negative for dysuria. Musculoskeletal: Negative for back pain. Integumentary: Negative for rash. Neurological: Negative for headaches, focal weakness or numbness.   ____________________________________________   PHYSICAL EXAM:  VITAL SIGNS: ED Triage Vitals  Enc Vitals Group     BP 08/28/16 2252 (!) 99/50     Pulse Rate 08/28/16 2252 74     Resp 08/28/16 2252 18     Temp 08/28/16 2252 98.4 F (36.9 C)     Temp Source 08/28/16 2252 Oral     SpO2 08/28/16 2252 100 %     Weight 08/28/16 2253 118 lb (53.5 kg)     Height 08/28/16 2253 5\' 2"  (1.575 m)     Head Circumference --      Peak Flow --      Pain Score 08/28/16 2251 7     Pain Loc --      Pain Edu? --      Excl. in GC? --     Constitutional: Alert and oriented. Well appearing and in no acute distress. Eyes: Conjunctivae are normal. PERRL. EOMI. Head:  Atraumatic. Mouth/Throat: Mucous membranes are dry.  Oropharynx non-erythematous. Neck: No stridor.   Cardiovascular: Normal rate, regular rhythm. Good peripheral circulation. Grossly normal heart sounds. Respiratory: Normal respiratory effort.  No retractions. Lungs CTAB. Gastrointestinal: Soft and nontender. No distention.  Musculoskeletal: No lower extremity tenderness nor edema. No gross deformities of extremities. Neurologic:  Normal speech and language. No gross focal neurologic deficits are appreciated.  Skin:  Skin is warm, dry and intact. No rash noted. Psychiatric: Mood and affect are normal. Speech and behavior are normal.  ____________________________________________   LABS (all labs ordered are listed, but only abnormal results are displayed)  Labs Reviewed  COMPREHENSIVE METABOLIC PANEL - Abnormal; Notable for the following:       Result Value  Potassium 3.2 (*)    Glucose, Bld 101 (*)    Calcium 8.5 (*)    All other components within normal limits  URINALYSIS, COMPLETE (UACMP) WITH MICROSCOPIC - Abnormal; Notable for the following:    Color, Urine YELLOW (*)    APPearance HAZY (*)    Specific Gravity, Urine 1.031 (*)    Protein, ur 30 (*)    Leukocytes, UA TRACE (*)    Squamous Epithelial / LPF 0-5 (*)    All other components within normal limits  CBC  LIPASE, BLOOD  POCT PREGNANCY, URINE  POC URINE PREG, ED     Procedures   ____________________________________________   INITIAL IMPRESSION / ASSESSMENT AND PLAN / ED COURSE  Pertinent labs & imaging results that were available during my care of the patient were reviewed by me and considered in my medical decision making (see chart for details).  Patient given IV normal saline 1 L Zofran 4 mg and Imodium.She'll be prescribed Imodium and Zofran for home. Suspect viral gastroenteritis etiology for the patient's symptoms.      ____________________________________________  FINAL CLINICAL IMPRESSION(S)  / ED DIAGNOSES  Final diagnoses:  Gastroenteritis     MEDICATIONS GIVEN DURING THIS VISIT:  Medications  sodium chloride 0.9 % bolus 1,000 mL (1,000 mLs Intravenous New Bag/Given 08/29/16 0035)  ondansetron (ZOFRAN) injection 4 mg (4 mg Intravenous Given 08/29/16 0034)  loperamide (IMODIUM) capsule 4 mg (4 mg Oral Given 08/29/16 0032)     NEW OUTPATIENT MEDICATIONS STARTED DURING THIS VISIT:  New Prescriptions   No medications on file    Modified Medications   No medications on file    Discontinued Medications   No medications on file     Note:  This document was prepared using Dragon voice recognition software and may include unintentional dictation errors.    Darci Current, MD 08/29/16 [phone]

## 2017-07-18 ENCOUNTER — Emergency Department (HOSPITAL_COMMUNITY)
Admission: EM | Admit: 2017-07-18 | Discharge: 2017-07-18 | Discharge status: Against Medical Advice | Payer: Medicaid Other

## 2017-07-18 NOTE — ED Notes (Signed)
Pt notified registration she is LWBS

## 2017-12-02 ENCOUNTER — Other Ambulatory Visit: Payer: Self-pay | Admitting: Adult Health

## 2018-09-02 ENCOUNTER — Other Ambulatory Visit: Payer: Self-pay

## 2018-09-02 ENCOUNTER — Other Ambulatory Visit: Payer: Medicaid Other

## 2018-09-02 ENCOUNTER — Telehealth: Payer: Self-pay | Admitting: Adult Health

## 2018-09-02 DIAGNOSIS — Z202 Contact with and (suspected) exposure to infections with a predominantly sexual mode of transmission: Secondary | ICD-10-CM | POA: Diagnosis not present

## 2018-09-02 NOTE — Telephone Encounter (Signed)
Voice mail not set up @ 10:22 am. If pt calls back, can schedule an appt for nurse visit for self swab. JSY

## 2018-09-02 NOTE — Telephone Encounter (Signed)
Patient called, she would like an appointment for STD check.  [phone]

## 2018-09-10 ENCOUNTER — Telehealth: Payer: Self-pay | Admitting: Women's Health

## 2018-09-10 NOTE — Telephone Encounter (Signed)
Pt would like a call with her STD lab results

## 2018-09-10 NOTE — Telephone Encounter (Signed)
Attempted to call patient back, heard message that vm full. Results not back, will call her Monday.

## 2018-09-12 LAB — NUSWAB VAGINITIS PLUS (VG+)
Atopobium vaginae: HIGH Score — AB
BVAB 2: HIGH Score — AB
Candida albicans, NAA: NEGATIVE
Candida glabrata, NAA: NEGATIVE
Chlamydia trachomatis, NAA: NEGATIVE
Megasphaera 1: HIGH Score — AB
Neisseria gonorrhoeae, NAA: NEGATIVE
Trich vag by NAA: POSITIVE — AB

## 2018-09-14 ENCOUNTER — Telehealth: Payer: Self-pay | Admitting: Adult Health

## 2018-09-14 ENCOUNTER — Other Ambulatory Visit: Payer: Self-pay | Admitting: Adult Health

## 2018-09-14 MED ORDER — METRONIDAZOLE 500 MG PO TABS: 500.0000 mg | ORAL_TABLET | Freq: Three times a day (TID) | ORAL | 0 refills | Status: DC

## 2018-09-14 NOTE — Telephone Encounter (Signed)
Pt aware that: Rx flagyl 500 mg tid for 7 days for BV and trich(sent to [facility]) and appt for POC 6/5 at 11, no sex and will treat partner Novian Herbin DOB 07/08/[birth year] with flagyl 500 mg #4 4 po now, no alcohol, sent to [facility].

## 2018-09-14 NOTE — Telephone Encounter (Signed)
Patient called, stated that she got a message on mychart to call back with her pharmacy.  [facility].  [phone]

## 2018-09-22 ENCOUNTER — Encounter: Payer: Self-pay | Admitting: *Deleted

## 2018-09-24 ENCOUNTER — Other Ambulatory Visit: Payer: Self-pay

## 2018-09-24 ENCOUNTER — Ambulatory Visit (INDEPENDENT_AMBULATORY_CARE_PROVIDER_SITE_OTHER): Payer: Medicaid Other | Admitting: Adult Health

## 2018-09-24 ENCOUNTER — Encounter: Payer: Self-pay | Admitting: Adult Health

## 2018-09-24 VITALS — BP 108/66 | HR 70 | Temp 98.5°F | Ht 63.0 in | Wt 127.0 lb

## 2018-09-24 DIAGNOSIS — Z8619 Personal history of other infectious and parasitic diseases: Secondary | ICD-10-CM

## 2018-09-24 LAB — POCT WET PREP (WET MOUNT)
Trichomonas Wet Prep HPF POC: ABSENT
WBC, Wet Prep HPF POC: POSITIVE

## 2018-09-24 NOTE — Progress Notes (Signed)
Patient ID: Ashley Simon, female   DOB: 1985/05/13, 33 y.o.   MRN: [id number] History of Present Illness:  Ashley Simon is a 33 year old black female, [id number] in for proof of cure for recent trich, she and partner took meds but she has had unprotected sex since. PCP is [facility].  Current Medications, Allergies, Past Medical History, Past Surgical History, Family History and Social History were reviewed in [employer] record.     Review of Systems: +vaginal discharge    Physical Exam:BP 108/66 (BP Location: Left Arm, Patient Position: Sitting, Cuff Size: Normal)   Pulse 70   Temp 98.5 F (36.9 C)   Ht 5\' 3"  (1.6 m)   Wt 127 lb (57.6 kg)   LMP 09/13/2018 (Approximate)   BMI 22.50 kg/m  General:  Well developed, well nourished, no acute distress Skin:  Warm and dry Pelvic:  External genitalia is normal in appearance, no lesions.  The vagina is normal in appearance, creamy discharge, no odor. Urethra has no lesions or masses. The cervix is bulbous.  Uterus is felt to be normal size, shape, and contour.  No adnexal masses or tenderness noted.Bladder is non tender, no masses felt. Wet prep:+WBCs, no trich Psych:  No mood changes, alert and cooperative,seems happy Fall risk is low. Examination chaperoned by Malachy Mood LPN.   Impression:   1. History of trichomoniasis       Plan: Pap and physical scheduled for 11/08/18 Use condoms

## 2018-10-11 ENCOUNTER — Telehealth: Payer: Self-pay | Admitting: Adult Health

## 2018-10-11 NOTE — Telephone Encounter (Signed)
Pt requesting an appt to see if her STD has gone away.

## 2018-10-12 NOTE — Telephone Encounter (Signed)
Pt decided to wait to be seen on her already scheduled appt.

## 2018-11-01 DIAGNOSIS — Z3009 Encounter for other general counseling and advice on contraception: Secondary | ICD-10-CM | POA: Diagnosis not present

## 2018-11-01 DIAGNOSIS — Z1388 Encounter for screening for disorder due to exposure to contaminants: Secondary | ICD-10-CM | POA: Diagnosis not present

## 2018-11-01 DIAGNOSIS — Z0389 Encounter for observation for other suspected diseases and conditions ruled out: Secondary | ICD-10-CM | POA: Diagnosis not present

## 2018-11-08 ENCOUNTER — Other Ambulatory Visit: Payer: Medicaid Other | Admitting: Adult Health

## 2018-12-01 ENCOUNTER — Other Ambulatory Visit: Payer: Medicaid Other | Admitting: Adult Health

## 2018-12-14 ENCOUNTER — Other Ambulatory Visit: Payer: Self-pay

## 2018-12-14 ENCOUNTER — Encounter: Payer: Self-pay | Admitting: Obstetrics and Gynecology

## 2018-12-14 ENCOUNTER — Ambulatory Visit: Payer: Medicaid Other | Admitting: Obstetrics and Gynecology

## 2018-12-14 VITALS — BP 101/63 | HR 69 | Ht 63.0 in | Wt 135.2 lb

## 2018-12-14 DIAGNOSIS — N921 Excessive and frequent menstruation with irregular cycle: Secondary | ICD-10-CM | POA: Diagnosis not present

## 2018-12-14 DIAGNOSIS — Z331 Pregnant state, incidental: Secondary | ICD-10-CM

## 2018-12-14 DIAGNOSIS — Z3202 Encounter for pregnancy test, result negative: Secondary | ICD-10-CM | POA: Diagnosis not present

## 2018-12-14 LAB — POCT URINE PREGNANCY: Preg Test, Ur: NEGATIVE

## 2018-12-14 MED ORDER — TRANEXAMIC ACID 650 MG PO TABS: 1300.0000 mg | ORAL_TABLET | Freq: Three times a day (TID) | ORAL | 0 refills | Status: DC

## 2018-12-14 MED ORDER — NORGESTIMATE-ETH ESTRADIOL 0.25-35 MG-MCG PO TABS: 1.0000 | ORAL_TABLET | Freq: Every day | ORAL | 11 refills | Status: DC

## 2018-12-14 MED ORDER — DOXYCYCLINE HYCLATE 100 MG PO CAPS: 100.0000 mg | ORAL_CAPSULE | Freq: Two times a day (BID) | ORAL | 0 refills | Status: DC

## 2018-12-14 NOTE — Progress Notes (Signed)
[facility] Visit  @DATE @            Patient name: Ashley PuntDenee S Renville MRN [id number]  Date of birth: 25-Feb-1986  CC & HPI:  Ashley PuntDenee S Christian is a 33 y.o. female presenting today for irregular bleeding with clots since the start of August.  She is sexually active without birth control during June and July.  Not sexually active at present..  She will submit pregnancy test She is uncomfortable with the expulsion of cramps.  No tissue seen and she denies fever, but has not actually taken her temperature ROS:  ROS Not desiring pregnancy no sexual partner at this time..  Pertinent History Reviewed:   Reviewed: Significant for  Medical         Past Medical History:  Diagnosis Date  . Abnormal Pap smear   . Anxiety   . Asthma   . BV (bacterial vaginosis) 09/07/2012  . Chronic headaches 06/29/2015  . Contraceptive management 06/29/2015  . Depression   . Hx of chlamydia infection   . Migraine   . Postpartum care and examination 06/29/2015  . 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 Trichimoniasis   . Vaginal discharge 12/01/2012   +clue cells will rx metrogel and get GC/CHL  . Vaginal odor 06/29/2015                              Surgical Hx:    Past Surgical History:  Procedure Laterality Date  . NO PAST SURGERIES     Medications: Reviewed & Updated - see associated section                       Current Outpatient Medications:  .  albuterol (PROVENTIL HFA;VENTOLIN HFA) 108 (90 Base) MCG/ACT inhaler, Inhale 2 puffs into the lungs every 6 (six) hours as needed for wheezing or shortness of breath. (Patient not taking: Reported on 12/14/2018), Disp: 1 Inhaler, Rfl: 2 .  guaiFENesin-codeine 100-10 MG/5ML syrup, Take by mouth., Disp: , Rfl:  .  loperamide (IMODIUM A-D) 2 MG tablet, Take 1 tablet (2 mg total) by mouth 4 (four) times daily as needed for diarrhea or loose stools. (Patient not taking: Reported on 09/24/2018), Disp: 30 tablet, Rfl: 0 .  loratadine-pseudoephedrine (CLARITIN-D 12  HOUR) 5-120 MG tablet, Take 1 tablet by mouth 2 (two) times daily. (Patient not taking: Reported on 12/15/2015), Disp: 60 tablet, Rfl: 0 .  metroNIDAZOLE (FLAGYL) 500 MG tablet, Take 1 tablet (500 mg total) by mouth 3 (three) times daily. (Patient not taking: Reported on 12/14/2018), Disp: 21 tablet, Rfl: 0 .  naproxen (NAPROSYN) 500 MG tablet, Take 1 tablet (500 mg total) by mouth 2 (two) times daily with a meal. (Patient not taking: Reported on 09/24/2018), Disp: 30 tablet, Rfl: 0 .  ondansetron (ZOFRAN ODT) 4 MG disintegrating tablet, Take 1 tablet (4 mg total) by mouth every 8 (eight) hours as needed for nausea or vomiting. (Patient not taking: Reported on 09/24/2018), Disp: 20 tablet, Rfl: 0   Social History: Reviewed -  reports that she has been smoking cigarettes. She has a 2.00 pack-year smoking history. She has never used smokeless tobacco.  Objective Findings:  Vitals: Blood pressure 101/63, pulse 69, height 5\' 3"  (1.6 m), weight 135 lb 3.2 oz (61.3 kg), last menstrual period 11/10/2018.  PHYSICAL EXAMINATION General appearance - alert, well appearing, and in no distress and peers uncomfortable Mental status - alert,  oriented to person, place, and time Chest -  Heart -  Abdomen -  Breasts -  Skin -   PELVIC External genitalia -normal external genitalia Vulva -  Vagina -clots in the vagina no tissue Cervix -multiparous nonpurulent  Uterus -mid position, not particularly tender  Adnexa -nontender Wet [place] -  Rectal -       Assessment & Plan:   A:  1. Dysmenorrhea, menorrhagia rule out pregnancy  P:  1.  Run pregnancy test . negative 1. 3 times daily birth control pills 2.  TXA, tranexamic acid 3 times daily until bleeding stops 3.  gc and Chlamydia collected

## 2018-12-14 NOTE — Addendum Note (Signed)
Addended by: Octaviano Glow on: 12/14/2018 10:00 AM   Modules accepted: Orders

## 2018-12-17 LAB — GC/CHLAMYDIA PROBE AMP
Chlamydia trachomatis, NAA: NEGATIVE
Neisseria Gonorrhoeae by PCR: NEGATIVE

## 2019-02-25 DIAGNOSIS — Z114 Encounter for screening for human immunodeficiency virus [HIV]: Secondary | ICD-10-CM | POA: Diagnosis not present

## 2019-02-25 DIAGNOSIS — Z0389 Encounter for observation for other suspected diseases and conditions ruled out: Secondary | ICD-10-CM | POA: Diagnosis not present

## 2019-02-25 DIAGNOSIS — N760 Acute vaginitis: Secondary | ICD-10-CM | POA: Diagnosis not present

## 2019-02-25 DIAGNOSIS — Z113 Encounter for screening for infections with a predominantly sexual mode of transmission: Secondary | ICD-10-CM | POA: Diagnosis not present

## 2019-02-25 DIAGNOSIS — Z1388 Encounter for screening for disorder due to exposure to contaminants: Secondary | ICD-10-CM | POA: Diagnosis not present

## 2019-02-25 DIAGNOSIS — Z3009 Encounter for other general counseling and advice on contraception: Secondary | ICD-10-CM | POA: Diagnosis not present

## 2019-02-25 DIAGNOSIS — N72 Inflammatory disease of cervix uteri: Secondary | ICD-10-CM | POA: Diagnosis not present

## 2019-03-28 ENCOUNTER — Telehealth: Payer: Self-pay | Admitting: *Deleted

## 2019-03-28 NOTE — Telephone Encounter (Signed)
Patient called stating [facility] she [facility] was upsetting her stomach. She needs to make an appointment as soon as possible for a check up.

## 2019-03-29 NOTE — Telephone Encounter (Signed)
No answer @ 5:02 pm. JSY

## 2019-03-30 ENCOUNTER — Telehealth: Payer: Self-pay | Admitting: *Deleted

## 2019-03-30 NOTE — Telephone Encounter (Signed)
Patient left message that she needs her prescription changed.

## 2019-03-30 NOTE — Telephone Encounter (Signed)
No answer @ 5:01 pm. JSY

## 2019-03-30 NOTE — Telephone Encounter (Signed)
No answer @ both #'s @ 10:38 am. JSY

## 2019-03-31 MED ORDER — METRONIDAZOLE 0.75 % VA GEL: VAGINAL | 0 refills | Status: DC

## 2019-03-31 NOTE — Telephone Encounter (Signed)
Will rx Metrogel  

## 2019-03-31 NOTE — Telephone Encounter (Addendum)
Pt has cramps, vomiting with Flagyl. Flagyl was prescribed from the [facility] dept on Nov.6 for BV. She took the med but stopped it due to the cramps and vomiting.  Pt still has a vaginal discharge. Can you prescribe the gel or does she need to be seen? Thanks!! Treynor

## 2019-03-31 NOTE — Addendum Note (Signed)
Addended by: Derrek Monaco A on: 03/31/2019 04:45 PM   Modules accepted: Orders

## 2020-01-24 ENCOUNTER — Ambulatory Visit
Admission: EM | Admit: 2020-01-24 | Discharge: 2020-01-24 | Disposition: A | Discharge status: Home / Self Care | Payer: Medicaid Other | Attending: Emergency Medicine | Admitting: Emergency Medicine

## 2020-01-24 ENCOUNTER — Other Ambulatory Visit: Payer: Self-pay

## 2020-01-24 DIAGNOSIS — Z1152 Encounter for screening for COVID-19: Secondary | ICD-10-CM | POA: Diagnosis not present

## 2020-01-24 NOTE — ED Triage Notes (Signed)
Needs covid test

## 2020-01-25 LAB — NOVEL CORONAVIRUS, NAA: SARS-CoV-2, NAA: NOT DETECTED

## 2020-01-25 LAB — SARS-COV-2, NAA 2 DAY TAT

## 2020-04-01 ENCOUNTER — Emergency Department (HOSPITAL_COMMUNITY): Payer: Medicaid Other

## 2020-04-01 ENCOUNTER — Encounter (HOSPITAL_COMMUNITY)
Admission: EM | Disposition: A | Discharge status: Home / Self Care | Payer: Self-pay | Source: Home / Self Care | Attending: Orthopedic Surgery

## 2020-04-01 ENCOUNTER — Inpatient Hospital Stay (HOSPITAL_COMMUNITY): Payer: Medicaid Other

## 2020-04-01 ENCOUNTER — Inpatient Hospital Stay (HOSPITAL_COMMUNITY)
Admission: EM | Admit: 2020-04-01 | Discharge: 2020-04-07 | DRG: 488 | Disposition: A | Discharge status: Home / Self Care | Payer: Medicaid Other | Attending: Orthopedic Surgery | Admitting: Orthopedic Surgery

## 2020-04-01 ENCOUNTER — Other Ambulatory Visit: Payer: Self-pay

## 2020-04-01 ENCOUNTER — Encounter (HOSPITAL_COMMUNITY): Payer: Self-pay | Admitting: Emergency Medicine

## 2020-04-01 ENCOUNTER — Emergency Department (HOSPITAL_COMMUNITY): Payer: Medicaid Other | Admitting: Certified Registered Nurse Anesthetist

## 2020-04-01 DIAGNOSIS — Z79899 Other long term (current) drug therapy: Secondary | ICD-10-CM

## 2020-04-01 DIAGNOSIS — R10812 Left upper quadrant abdominal tenderness: Secondary | ICD-10-CM | POA: Diagnosis present

## 2020-04-01 DIAGNOSIS — S72431A Displaced fracture of medial condyle of right femur, initial encounter for closed fracture: Secondary | ICD-10-CM | POA: Diagnosis present

## 2020-04-01 DIAGNOSIS — F4310 Post-traumatic stress disorder, unspecified: Secondary | ICD-10-CM | POA: Diagnosis present

## 2020-04-01 DIAGNOSIS — Z803 Family history of malignant neoplasm of breast: Secondary | ICD-10-CM

## 2020-04-01 DIAGNOSIS — F419 Anxiety disorder, unspecified: Secondary | ICD-10-CM | POA: Diagnosis present

## 2020-04-01 DIAGNOSIS — Z825 Family history of asthma and other chronic lower respiratory diseases: Secondary | ICD-10-CM | POA: Diagnosis not present

## 2020-04-01 DIAGNOSIS — F430 Acute stress reaction: Secondary | ICD-10-CM | POA: Diagnosis present

## 2020-04-01 DIAGNOSIS — Z419 Encounter for procedure for purposes other than remedying health state, unspecified: Secondary | ICD-10-CM

## 2020-04-01 DIAGNOSIS — Z8249 Family history of ischemic heart disease and other diseases of the circulatory system: Secondary | ICD-10-CM | POA: Diagnosis not present

## 2020-04-01 DIAGNOSIS — F1910 Other psychoactive substance abuse, uncomplicated: Secondary | ICD-10-CM | POA: Diagnosis present

## 2020-04-01 DIAGNOSIS — Z820 Family history of epilepsy and other diseases of the nervous system: Secondary | ICD-10-CM

## 2020-04-01 DIAGNOSIS — Z9119 Patient's noncompliance with other medical treatment and regimen: Secondary | ICD-10-CM

## 2020-04-01 DIAGNOSIS — Z48817 Encounter for surgical aftercare following surgery on the skin and subcutaneous tissue: Secondary | ICD-10-CM | POA: Diagnosis not present

## 2020-04-01 DIAGNOSIS — Z833 Family history of diabetes mellitus: Secondary | ICD-10-CM | POA: Diagnosis not present

## 2020-04-01 DIAGNOSIS — F418 Other specified anxiety disorders: Secondary | ICD-10-CM | POA: Diagnosis not present

## 2020-04-01 DIAGNOSIS — J45909 Unspecified asthma, uncomplicated: Secondary | ICD-10-CM | POA: Diagnosis not present

## 2020-04-01 DIAGNOSIS — S82871B Displaced pilon fracture of right tibia, initial encounter for open fracture type I or II: Secondary | ICD-10-CM | POA: Diagnosis present

## 2020-04-01 DIAGNOSIS — S82141A Displaced bicondylar fracture of right tibia, initial encounter for closed fracture: Secondary | ICD-10-CM | POA: Diagnosis not present

## 2020-04-01 DIAGNOSIS — M79672 Pain in left foot: Secondary | ICD-10-CM | POA: Diagnosis not present

## 2020-04-01 DIAGNOSIS — T1490XA Injury, unspecified, initial encounter: Secondary | ICD-10-CM

## 2020-04-01 DIAGNOSIS — S3991XA Unspecified injury of abdomen, initial encounter: Secondary | ICD-10-CM | POA: Diagnosis not present

## 2020-04-01 DIAGNOSIS — S299XXA Unspecified injury of thorax, initial encounter: Secondary | ICD-10-CM | POA: Diagnosis not present

## 2020-04-01 DIAGNOSIS — Y92410 Unspecified street and highway as the place of occurrence of the external cause: Secondary | ICD-10-CM

## 2020-04-01 DIAGNOSIS — Z888 Allergy status to other drugs, medicaments and biological substances status: Secondary | ICD-10-CM | POA: Diagnosis not present

## 2020-04-01 DIAGNOSIS — Z8041 Family history of malignant neoplasm of ovary: Secondary | ICD-10-CM

## 2020-04-01 DIAGNOSIS — S82891C Other fracture of right lower leg, initial encounter for open fracture type IIIA, IIIB, or IIIC: Secondary | ICD-10-CM | POA: Diagnosis not present

## 2020-04-01 DIAGNOSIS — N760 Acute vaginitis: Secondary | ICD-10-CM | POA: Diagnosis present

## 2020-04-01 DIAGNOSIS — Z8619 Personal history of other infectious and parasitic diseases: Secondary | ICD-10-CM

## 2020-04-01 DIAGNOSIS — M79675 Pain in left toe(s): Secondary | ICD-10-CM | POA: Diagnosis not present

## 2020-04-01 DIAGNOSIS — E559 Vitamin D deficiency, unspecified: Secondary | ICD-10-CM | POA: Diagnosis not present

## 2020-04-01 DIAGNOSIS — R0902 Hypoxemia: Secondary | ICD-10-CM | POA: Diagnosis not present

## 2020-04-01 DIAGNOSIS — R0689 Other abnormalities of breathing: Secondary | ICD-10-CM | POA: Diagnosis not present

## 2020-04-01 DIAGNOSIS — G43909 Migraine, unspecified, not intractable, without status migrainosus: Secondary | ICD-10-CM | POA: Diagnosis present

## 2020-04-01 DIAGNOSIS — S82891B Other fracture of right lower leg, initial encounter for open fracture type I or II: Secondary | ICD-10-CM | POA: Diagnosis not present

## 2020-04-01 DIAGNOSIS — F32A Depression, unspecified: Secondary | ICD-10-CM | POA: Diagnosis present

## 2020-04-01 DIAGNOSIS — D62 Acute posthemorrhagic anemia: Secondary | ICD-10-CM | POA: Diagnosis present

## 2020-04-01 DIAGNOSIS — S82391A Other fracture of lower end of right tibia, initial encounter for closed fracture: Secondary | ICD-10-CM | POA: Diagnosis not present

## 2020-04-01 DIAGNOSIS — F17210 Nicotine dependence, cigarettes, uncomplicated: Secondary | ICD-10-CM | POA: Diagnosis present

## 2020-04-01 DIAGNOSIS — Z041 Encounter for examination and observation following transport accident: Secondary | ICD-10-CM | POA: Diagnosis not present

## 2020-04-01 DIAGNOSIS — S83281A Other tear of lateral meniscus, current injury, right knee, initial encounter: Secondary | ICD-10-CM | POA: Diagnosis not present

## 2020-04-01 DIAGNOSIS — S99921A Unspecified injury of right foot, initial encounter: Secondary | ICD-10-CM | POA: Diagnosis not present

## 2020-04-01 DIAGNOSIS — S82201A Unspecified fracture of shaft of right tibia, initial encounter for closed fracture: Secondary | ICD-10-CM | POA: Diagnosis not present

## 2020-04-01 DIAGNOSIS — Z471 Aftercare following joint replacement surgery: Secondary | ICD-10-CM | POA: Diagnosis not present

## 2020-04-01 DIAGNOSIS — S82111A Displaced fracture of right tibial spine, initial encounter for closed fracture: Secondary | ICD-10-CM | POA: Diagnosis not present

## 2020-04-01 DIAGNOSIS — S82191A Other fracture of upper end of right tibia, initial encounter for closed fracture: Secondary | ICD-10-CM | POA: Diagnosis not present

## 2020-04-01 DIAGNOSIS — T797XXA Traumatic subcutaneous emphysema, initial encounter: Secondary | ICD-10-CM | POA: Diagnosis not present

## 2020-04-01 DIAGNOSIS — F17200 Nicotine dependence, unspecified, uncomplicated: Secondary | ICD-10-CM | POA: Diagnosis present

## 2020-04-01 DIAGNOSIS — U071 COVID-19: Secondary | ICD-10-CM | POA: Diagnosis present

## 2020-04-01 DIAGNOSIS — J90 Pleural effusion, not elsewhere classified: Secondary | ICD-10-CM | POA: Diagnosis not present

## 2020-04-01 DIAGNOSIS — Z800 Family history of malignant neoplasm of digestive organs: Secondary | ICD-10-CM

## 2020-04-01 DIAGNOSIS — Z96661 Presence of right artificial ankle joint: Secondary | ICD-10-CM | POA: Diagnosis not present

## 2020-04-01 DIAGNOSIS — S82871C Displaced pilon fracture of right tibia, initial encounter for open fracture type IIIA, IIIB, or IIIC: Secondary | ICD-10-CM | POA: Diagnosis present

## 2020-04-01 DIAGNOSIS — S92141A Displaced dome fracture of right talus, initial encounter for closed fracture: Secondary | ICD-10-CM | POA: Diagnosis not present

## 2020-04-01 DIAGNOSIS — T148XXA Other injury of unspecified body region, initial encounter: Secondary | ICD-10-CM

## 2020-04-01 DIAGNOSIS — F411 Generalized anxiety disorder: Secondary | ICD-10-CM | POA: Diagnosis not present

## 2020-04-01 DIAGNOSIS — S82451A Displaced comminuted fracture of shaft of right fibula, initial encounter for closed fracture: Secondary | ICD-10-CM | POA: Diagnosis not present

## 2020-04-01 DIAGNOSIS — S82831A Other fracture of upper and lower end of right fibula, initial encounter for closed fracture: Secondary | ICD-10-CM | POA: Diagnosis not present

## 2020-04-01 DIAGNOSIS — S82899A Other fracture of unspecified lower leg, initial encounter for closed fracture: Secondary | ICD-10-CM

## 2020-04-01 DIAGNOSIS — I959 Hypotension, unspecified: Secondary | ICD-10-CM | POA: Diagnosis not present

## 2020-04-01 HISTORY — PX: COMPLEX WOUND CLOSURE: SHX6446

## 2020-04-01 HISTORY — PX: I & D EXTREMITY: SHX5045

## 2020-04-01 HISTORY — PX: EXTERNAL FIXATION LEG: SHX1549

## 2020-04-01 HISTORY — DX: Nicotine dependence, unspecified, uncomplicated: F17.200

## 2020-04-01 LAB — I-STAT CHEM 8, ED
BUN: 11 mg/dL (ref 6–20)
Calcium, Ion: 1.04 mmol/L — ABNORMAL LOW (ref 1.15–1.40)
Chloride: 111 mmol/L (ref 98–111)
Creatinine, Ser: 0.7 mg/dL (ref 0.44–1.00)
Glucose, Bld: 96 mg/dL (ref 70–99)
HCT: 39 % (ref 36.0–46.0)
Hemoglobin: 13.3 g/dL (ref 12.0–15.0)
Potassium: 4 mmol/L (ref 3.5–5.1)
Sodium: 140 mmol/L (ref 135–145)
TCO2: 19 mmol/L — ABNORMAL LOW (ref 22–32)

## 2020-04-01 LAB — COMPREHENSIVE METABOLIC PANEL
ALT: 35 U/L (ref 0–44)
AST: 37 U/L (ref 15–41)
Albumin: 3.5 g/dL (ref 3.5–5.0)
Alkaline Phosphatase: 83 U/L (ref 38–126)
Anion gap: 13 (ref 5–15)
BUN: 10 mg/dL (ref 6–20)
CO2: 19 mmol/L — ABNORMAL LOW (ref 22–32)
Calcium: 8.6 mg/dL — ABNORMAL LOW (ref 8.9–10.3)
Chloride: 106 mmol/L (ref 98–111)
Creatinine, Ser: 0.78 mg/dL (ref 0.44–1.00)
GFR, Estimated: >60 mL/min (ref >60)
Glucose, Bld: 112 mg/dL — ABNORMAL HIGH (ref 70–99)
Potassium: 3.6 mmol/L (ref 3.5–5.1)
Sodium: 138 mmol/L (ref 135–145)
Total Bilirubin: 0.4 mg/dL (ref 0.3–1.2)
Total Protein: 6.6 g/dL (ref 6.5–8.1)

## 2020-04-01 LAB — LACTIC ACID, PLASMA: Lactic Acid, Venous: 3 mmol/L (ref 0.5–1.9)

## 2020-04-01 LAB — CBC
HCT: 37.4 % (ref 36.0–46.0)
Hemoglobin: 11.2 g/dL — ABNORMAL LOW (ref 12.0–15.0)
MCH: 27.1 pg (ref 26.0–34.0)
MCHC: 29.9 g/dL — ABNORMAL LOW (ref 30.0–36.0)
MCV: 90.3 fL (ref 80.0–100.0)
Platelets: 294 10*3/uL (ref 150–400)
RBC: 4.14 MIL/uL (ref 3.87–5.11)
RDW: 17.1 % — ABNORMAL HIGH (ref 11.5–15.5)
WBC: 10.3 10*3/uL (ref 4.0–10.5)
nRBC: 0 % (ref 0.0–0.2)

## 2020-04-01 LAB — ETHANOL: Alcohol, Ethyl (B): <10 mg/dL (ref <10)

## 2020-04-01 LAB — RESP PANEL BY RT-PCR (FLU A&B, COVID) ARPGX2
Influenza A by PCR: NEGATIVE
Influenza B by PCR: NEGATIVE
SARS Coronavirus 2 by RT PCR: POSITIVE — AB

## 2020-04-01 LAB — SAMPLE TO BLOOD BANK

## 2020-04-01 LAB — I-STAT BETA HCG BLOOD, ED (MC, WL, AP ONLY): I-stat hCG, quantitative: <5 m[IU]/mL (ref <5)

## 2020-04-01 LAB — PROTIME-INR
INR: 0.9 (ref 0.8–1.2)
Prothrombin Time: 12 seconds (ref 11.4–15.2)

## 2020-04-01 SURGERY — IRRIGATION AND DEBRIDEMENT EXTREMITY
Anesthesia: General | Site: Leg Lower | Laterality: Right

## 2020-04-01 MED ORDER — AMISULPRIDE (ANTIEMETIC) 5 MG/2ML IV SOLN: 10.0000 mg | Freq: Once | INTRAVENOUS | Status: DC | PRN

## 2020-04-01 MED ORDER — FENTANYL CITRATE (PF) 250 MCG/5ML IJ SOLN
INTRAMUSCULAR | Status: AC
  Filled 2020-04-01: qty 5

## 2020-04-01 MED ORDER — SUGAMMADEX SODIUM 200 MG/2ML IV SOLN
INTRAVENOUS | Status: DC | PRN
  Administered 2020-04-01: 200 mg via INTRAVENOUS

## 2020-04-01 MED ORDER — SUCCINYLCHOLINE CHLORIDE 200 MG/10ML IV SOSY
PREFILLED_SYRINGE | INTRAVENOUS | Status: DC | PRN
  Administered 2020-04-01: 100 mg via INTRAVENOUS

## 2020-04-01 MED ORDER — HYDROMORPHONE HCL 1 MG/ML IJ SOLN
1.0000 mg | Freq: Once | INTRAMUSCULAR | Status: AC
  Administered 2020-04-01: 08:00:00 1 mg via INTRAVENOUS
  Filled 2020-04-01: qty 1

## 2020-04-01 MED ORDER — HYDROMORPHONE HCL 1 MG/ML IJ SOLN
0.5000 mg | INTRAMUSCULAR | Status: DC | PRN
Start: 2020-04-01 — End: 2020-04-02
  Administered 2020-04-01 – 2020-04-02 (×5): 1 mg via INTRAVENOUS
  Filled 2020-04-01 (×5): qty 1

## 2020-04-01 MED ORDER — SODIUM CHLORIDE 0.9 % IR SOLN
Status: DC | PRN
  Administered 2020-04-01: 3000 mL
  Administered 2020-04-01: 1000 mL

## 2020-04-01 MED ORDER — ACETAMINOPHEN 10 MG/ML IV SOLN: 1000.0000 mg | Freq: Once | INTRAVENOUS | Status: DC | PRN

## 2020-04-01 MED ORDER — ACETAMINOPHEN 325 MG PO TABS: 325.0000 mg | ORAL_TABLET | Freq: Four times a day (QID) | ORAL | Status: DC | PRN

## 2020-04-01 MED ORDER — DEXMEDETOMIDINE (PRECEDEX) IN NS 20 MCG/5ML (4 MCG/ML) IV SYRINGE
PREFILLED_SYRINGE | INTRAVENOUS | Status: DC | PRN
  Administered 2020-04-01: 20 ug via INTRAVENOUS

## 2020-04-01 MED ORDER — MIDAZOLAM HCL 2 MG/2ML IJ SOLN
INTRAMUSCULAR | Status: DC | PRN
  Administered 2020-04-01: 2 mg via INTRAVENOUS

## 2020-04-01 MED ORDER — ONDANSETRON HCL 4 MG/2ML IJ SOLN
INTRAMUSCULAR | Status: AC
  Filled 2020-04-01: qty 2

## 2020-04-01 MED ORDER — PANTOPRAZOLE SODIUM 40 MG PO TBEC
40.0000 mg | DELAYED_RELEASE_TABLET | Freq: Every day | ORAL | Status: DC
  Administered 2020-04-02 – 2020-04-07 (×5): 40 mg via ORAL
  Filled 2020-04-01 (×7): qty 1

## 2020-04-01 MED ORDER — ONDANSETRON HCL 4 MG PO TABS: 4.0000 mg | ORAL_TABLET | Freq: Four times a day (QID) | ORAL | Status: DC | PRN

## 2020-04-01 MED ORDER — ROCURONIUM BROMIDE 10 MG/ML (PF) SYRINGE
PREFILLED_SYRINGE | INTRAVENOUS | Status: DC | PRN
  Administered 2020-04-01: 60 mg via INTRAVENOUS

## 2020-04-01 MED ORDER — HYDROMORPHONE HCL 1 MG/ML IJ SOLN
INTRAMUSCULAR | Status: AC
  Filled 2020-04-01: qty 0.5

## 2020-04-01 MED ORDER — FENTANYL CITRATE (PF) 100 MCG/2ML IJ SOLN
50.0000 ug | Freq: Once | INTRAMUSCULAR | Status: AC
  Administered 2020-04-01: 06:00:00 50 ug via INTRAVENOUS
  Filled 2020-04-01: qty 2

## 2020-04-01 MED ORDER — CEFAZOLIN SODIUM-DEXTROSE 2-3 GM-%(50ML) IV SOLR
INTRAVENOUS | Status: DC | PRN
  Administered 2020-04-01: 2 g via INTRAVENOUS

## 2020-04-01 MED ORDER — FENTANYL CITRATE (PF) 250 MCG/5ML IJ SOLN
INTRAMUSCULAR | Status: DC | PRN
  Administered 2020-04-01: 100 ug via INTRAVENOUS
  Administered 2020-04-01 (×6): 50 ug via INTRAVENOUS

## 2020-04-01 MED ORDER — ONDANSETRON HCL 4 MG/2ML IJ SOLN
INTRAMUSCULAR | Status: DC | PRN
  Administered 2020-04-01: 4 mg via INTRAVENOUS

## 2020-04-01 MED ORDER — HYDROMORPHONE HCL 1 MG/ML IJ SOLN
INTRAMUSCULAR | Status: AC
  Filled 2020-04-01: qty 1

## 2020-04-01 MED ORDER — CEFAZOLIN SODIUM-DEXTROSE 1-4 GM/50ML-% IV SOLN
1.0000 g | Freq: Once | INTRAVENOUS | Status: AC
  Administered 2020-04-01: 04:00:00 1 g via INTRAVENOUS
  Filled 2020-04-01: qty 50

## 2020-04-01 MED ORDER — LIDOCAINE 2% (20 MG/ML) 5 ML SYRINGE: INTRAMUSCULAR | Status: DC | PRN

## 2020-04-01 MED ORDER — OXYCODONE HCL 5 MG PO TABS
10.0000 mg | ORAL_TABLET | ORAL | Status: DC | PRN
  Administered 2020-04-01 – 2020-04-02 (×4): 15 mg via ORAL
  Filled 2020-04-01 (×4): qty 3

## 2020-04-01 MED ORDER — DEXMEDETOMIDINE (PRECEDEX) IN NS 20 MCG/5ML (4 MCG/ML) IV SYRINGE
PREFILLED_SYRINGE | INTRAVENOUS | Status: AC
  Filled 2020-04-01: qty 5

## 2020-04-01 MED ORDER — MIDAZOLAM HCL 2 MG/2ML IJ SOLN
INTRAMUSCULAR | Status: AC
  Filled 2020-04-01: qty 2

## 2020-04-01 MED ORDER — ONDANSETRON HCL 4 MG/2ML IJ SOLN: 4.0000 mg | Freq: Four times a day (QID) | INTRAMUSCULAR | Status: DC | PRN

## 2020-04-01 MED ORDER — GABAPENTIN 100 MG PO CAPS
100.0000 mg | ORAL_CAPSULE | Freq: Three times a day (TID) | ORAL | Status: DC
  Administered 2020-04-01 – 2020-04-02 (×2): 100 mg via ORAL
  Filled 2020-04-01 (×4): qty 1

## 2020-04-01 MED ORDER — PROPOFOL 10 MG/ML IV BOLUS
INTRAVENOUS | Status: DC | PRN
  Administered 2020-04-01: 140 mg via INTRAVENOUS
  Administered 2020-04-01: 20 mg via INTRAVENOUS

## 2020-04-01 MED ORDER — DEXAMETHASONE SODIUM PHOSPHATE 10 MG/ML IJ SOLN
INTRAMUSCULAR | Status: DC | PRN
  Administered 2020-04-01: 5 mg via INTRAVENOUS

## 2020-04-01 MED ORDER — LIDOCAINE HCL (PF) 2 % IJ SOLN
INTRAMUSCULAR | Status: AC
  Filled 2020-04-01: qty 5

## 2020-04-01 MED ORDER — OXYCODONE HCL 5 MG PO TABS: 5.0000 mg | ORAL_TABLET | ORAL | Status: DC | PRN

## 2020-04-01 MED ORDER — FENTANYL CITRATE (PF) 100 MCG/2ML IJ SOLN
50.0000 ug | Freq: Once | INTRAMUSCULAR | Status: AC
Start: 2020-04-01 — End: 2020-04-01
  Administered 2020-04-01: 04:00:00 50 ug via INTRAVENOUS
  Filled 2020-04-01: qty 2

## 2020-04-01 MED ORDER — SODIUM CHLORIDE 0.9 % IV BOLUS
1000.0000 mL | Freq: Once | INTRAVENOUS | Status: AC
  Administered 2020-04-01: 04:00:00 1000 mL via INTRAVENOUS

## 2020-04-01 MED ORDER — LIDOCAINE 2% (20 MG/ML) 5 ML SYRINGE
INTRAMUSCULAR | Status: DC | PRN
  Administered 2020-04-01: 60 mg via INTRAVENOUS

## 2020-04-01 MED ORDER — DEXAMETHASONE SODIUM PHOSPHATE 10 MG/ML IJ SOLN
INTRAMUSCULAR | Status: AC
  Filled 2020-04-01: qty 1

## 2020-04-01 MED ORDER — LACTATED RINGERS IV SOLN: INTRAVENOUS | Status: DC | PRN

## 2020-04-01 MED ORDER — DIPHENHYDRAMINE HCL 12.5 MG/5ML PO ELIX
12.5000 mg | ORAL_SOLUTION | ORAL | Status: DC | PRN
  Administered 2020-04-01 – 2020-04-03 (×5): 25 mg via ORAL
  Filled 2020-04-01 (×8): qty 10

## 2020-04-01 MED ORDER — DEXTROSE 5 % IV SOLN
500.0000 mg | Freq: Four times a day (QID) | INTRAVENOUS | Status: DC | PRN
  Filled 2020-04-01: qty 5

## 2020-04-01 MED ORDER — METHOCARBAMOL 500 MG PO TABS
500.0000 mg | ORAL_TABLET | Freq: Four times a day (QID) | ORAL | Status: DC | PRN
  Administered 2020-04-02: 11:00:00 500 mg via ORAL
  Filled 2020-04-01: qty 1

## 2020-04-01 MED ORDER — HYDROMORPHONE HCL 1 MG/ML IJ SOLN
INTRAMUSCULAR | Status: DC | PRN
  Administered 2020-04-01: .5 mg via INTRAVENOUS

## 2020-04-01 MED ORDER — SUCCINYLCHOLINE CHLORIDE 200 MG/10ML IV SOSY
PREFILLED_SYRINGE | INTRAVENOUS | Status: AC
  Filled 2020-04-01: qty 10

## 2020-04-01 MED ORDER — DOCUSATE SODIUM 100 MG PO CAPS
100.0000 mg | ORAL_CAPSULE | Freq: Two times a day (BID) | ORAL | Status: DC
  Administered 2020-04-01 – 2020-04-07 (×12): 100 mg via ORAL
  Filled 2020-04-01 (×12): qty 1

## 2020-04-01 MED ORDER — CHLORHEXIDINE GLUCONATE CLOTH 2 % EX PADS
6.0000 | MEDICATED_PAD | Freq: Every day | CUTANEOUS | Status: DC
  Administered 2020-04-02 – 2020-04-07 (×6): 6 via TOPICAL

## 2020-04-01 MED ORDER — HYDROMORPHONE HCL 1 MG/ML IJ SOLN
0.2500 mg | INTRAMUSCULAR | Status: DC | PRN
  Administered 2020-04-01 (×2): 0.5 mg via INTRAVENOUS

## 2020-04-01 MED ORDER — PROPOFOL 10 MG/ML IV BOLUS
INTRAVENOUS | Status: AC
  Filled 2020-04-01: qty 20

## 2020-04-01 MED ORDER — IOHEXOL 300 MG/ML  SOLN
100.0000 mL | Freq: Once | INTRAMUSCULAR | Status: AC | PRN
  Administered 2020-04-01: 07:00:00 100 mL via INTRAVENOUS

## 2020-04-01 MED ORDER — METOCLOPRAMIDE HCL 5 MG PO TABS
5.0000 mg | ORAL_TABLET | Freq: Three times a day (TID) | ORAL | Status: DC | PRN
  Filled 2020-04-01: qty 2

## 2020-04-01 MED ORDER — SODIUM CHLORIDE 0.9 % IV SOLN
2.0000 g | INTRAVENOUS | Status: DC
  Administered 2020-04-01 – 2020-04-02 (×2): 2 g via INTRAVENOUS
  Filled 2020-04-01 (×2): qty 20

## 2020-04-01 MED ORDER — PROMETHAZINE HCL 25 MG/ML IJ SOLN: 6.2500 mg | INTRAMUSCULAR | Status: DC | PRN

## 2020-04-01 MED ORDER — METOCLOPRAMIDE HCL 5 MG/ML IJ SOLN: 5.0000 mg | Freq: Three times a day (TID) | INTRAMUSCULAR | Status: DC | PRN

## 2020-04-01 MED ORDER — ROCURONIUM BROMIDE 10 MG/ML (PF) SYRINGE
PREFILLED_SYRINGE | INTRAVENOUS | Status: AC
  Filled 2020-04-01: qty 10

## 2020-04-01 MED ORDER — DIPHENHYDRAMINE HCL 50 MG/ML IJ SOLN
INTRAMUSCULAR | Status: DC | PRN
  Administered 2020-04-01 (×3): 6.25 mg via INTRAVENOUS

## 2020-04-01 MED ORDER — SODIUM CHLORIDE 0.9 % IV SOLN: INTRAVENOUS | Status: DC

## 2020-04-01 MED ORDER — CEFAZOLIN SODIUM 1 G IJ SOLR
INTRAMUSCULAR | Status: AC
  Filled 2020-04-01: qty 20

## 2020-04-01 SURGICAL SUPPLY — 66 items
BAR EXFX 400X11 NS LF (EXFIX) ×6
BAR EXFX 600X11 NS LF (EXFIX) ×6
BAR GLASS FIBER EXFX 11X400 (EXFIX) ×4 IMPLANT
BAR GLASS FIBER EXFX 11X600 (EXFIX) ×6 IMPLANT
BLADE SURG 10 STRL SS (BLADE) ×4 IMPLANT
BNDG COHESIVE 4X5 TAN STRL (GAUZE/BANDAGES/DRESSINGS) ×4 IMPLANT
BNDG COHESIVE 6X5 TAN STRL LF (GAUZE/BANDAGES/DRESSINGS) ×8 IMPLANT
BNDG ELASTIC 3X5.8 VLCR STR LF (GAUZE/BANDAGES/DRESSINGS) IMPLANT
BNDG ELASTIC 4X5.8 VLCR STR LF (GAUZE/BANDAGES/DRESSINGS) ×4 IMPLANT
CLAMP BLUE BAR TO BAR (EXFIX) ×4 IMPLANT
CLAMP BLUE BAR TO PIN (EXFIX) ×2 IMPLANT
COVER SURGICAL LIGHT HANDLE (MISCELLANEOUS) ×4 IMPLANT
COVER WAND RF STERILE (DRAPES) ×4 IMPLANT
CUFF TOURN SGL QUICK 18X4 (TOURNIQUET CUFF) ×4 IMPLANT
CUFF TOURN SGL QUICK 34 (TOURNIQUET CUFF)
CUFF TRNQT CYL 34X4.125X (TOURNIQUET CUFF) IMPLANT
DRAPE C-ARM 42X72 X-RAY (DRAPES) ×4 IMPLANT
DRAPE ORTHO SPLIT 77X108 STRL (DRAPES) ×8
DRAPE SURG 17X23 STRL (DRAPES) IMPLANT
DRAPE SURG ORHT 6 SPLT 77X108 (DRAPES) ×6 IMPLANT
DRAPE U-SHAPE 47X51 STRL (DRAPES) ×4 IMPLANT
DRSG PAD ABDOMINAL 8X10 ST (GAUZE/BANDAGES/DRESSINGS) ×3 IMPLANT
DRSG XEROFORM 1X8 (GAUZE/BANDAGES/DRESSINGS) ×3 IMPLANT
ELECT REM PT RETURN 9FT ADLT (ELECTROSURGICAL)
ELECTRODE REM PT RTRN 9FT ADLT (ELECTROSURGICAL) IMPLANT
GAUZE SPONGE 4X4 12PLY STRL (GAUZE/BANDAGES/DRESSINGS) ×5 IMPLANT
GAUZE XEROFORM 1X8 LF (GAUZE/BANDAGES/DRESSINGS) ×4 IMPLANT
GAUZE XEROFORM 5X9 LF (GAUZE/BANDAGES/DRESSINGS) ×4 IMPLANT
GLOVE BIO SURGEON STRL SZ8 (GLOVE) ×4 IMPLANT
GLOVE BIOGEL PI IND STRL 8 (GLOVE) ×6 IMPLANT
GLOVE BIOGEL PI INDICATOR 8 (GLOVE) ×2
GLOVE ORTHO TXT STRL SZ7.5 (GLOVE) ×4 IMPLANT
GLOVE SURG UNDER POLY LF SZ7 (GLOVE) ×9 IMPLANT
GOWN STRL REUS W/ TWL LRG LVL3 (GOWN DISPOSABLE) ×6 IMPLANT
GOWN STRL REUS W/ TWL XL LVL3 (GOWN DISPOSABLE) ×12 IMPLANT
GOWN STRL REUS W/TWL LRG LVL3 (GOWN DISPOSABLE) ×16
GOWN STRL REUS W/TWL XL LVL3 (GOWN DISPOSABLE) ×20
HANDPIECE INTERPULSE COAX TIP (DISPOSABLE) ×4
IV NS IRRIG 3000ML ARTHROMATIC (IV SOLUTION) ×1 IMPLANT
KIT BASIN OR (CUSTOM PROCEDURE TRAY) ×4 IMPLANT
KIT TURNOVER KIT B (KITS) ×4 IMPLANT
MANIFOLD NEPTUNE II (INSTRUMENTS) ×4 IMPLANT
NS IRRIG 1000ML POUR BTL (IV SOLUTION) ×4 IMPLANT
PACK ORTHO EXTREMITY (CUSTOM PROCEDURE TRAY) ×4 IMPLANT
PAD ARMBOARD 7.5X6 YLW CONV (MISCELLANEOUS) ×8 IMPLANT
PADDING WEBRIL 4 STERILE (GAUZE/BANDAGES/DRESSINGS) ×4 IMPLANT
PIN CLAMP 2BAR 75MM BLUE (EXFIX) ×4 IMPLANT
PIN HALF ORANGE 5X200X45MM (EXFIX) ×8 IMPLANT
PIN HALF YELLOW 5X160X35 (EXFIX) ×2 IMPLANT
PIN TRANSFIXING 5.0 (EXFIX) ×2 IMPLANT
SET HNDPC FAN SPRY TIP SCT (DISPOSABLE) IMPLANT
SOL PREP PROV IODINE SCRUB 4OZ (MISCELLANEOUS) ×1 IMPLANT
SPONGE LAP 18X18 RF (DISPOSABLE) ×4 IMPLANT
STOCKINETTE IMPERVIOUS 9X36 MD (GAUZE/BANDAGES/DRESSINGS) ×4 IMPLANT
SUT ETHILON 2 0 FS 18 (SUTURE) IMPLANT
SUT ETHILON 2 0 PSLX (SUTURE) ×2 IMPLANT
SUT ETHILON 3 0 PS 1 (SUTURE) IMPLANT
SUT VIC AB 2-0 CT1 27 (SUTURE)
SUT VIC AB 2-0 CT1 TAPERPNT 27 (SUTURE) IMPLANT
SWAB CULTURE ESWAB REG 1ML (MISCELLANEOUS) IMPLANT
TOWEL GREEN STERILE (TOWEL DISPOSABLE) ×4 IMPLANT
TOWEL GREEN STERILE FF (TOWEL DISPOSABLE) ×4 IMPLANT
TUBE CONNECTING 12X1/4 (SUCTIONS) ×4 IMPLANT
UNDERPAD 30X36 HEAVY ABSORB (UNDERPADS AND DIAPERS) ×4 IMPLANT
WATER STERILE IRR 1000ML POUR (IV SOLUTION) ×4 IMPLANT
YANKAUER SUCT BULB TIP NO VENT (SUCTIONS) ×4 IMPLANT
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
## 2020-04-01 NOTE — ED Provider Notes (Signed)
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 the inherent limitations of voice recognition software.     Marianna Cid, Canary Brim, MD 04/01/20 [phone]

## 2020-04-01 NOTE — Progress Notes (Signed)
Patient ID: Ashley Simon, female   DOB: 1985-10-31, 34 y.o.   MRN: [id number] The CT scan of the right knee does show a highly comminuted and complex tibial plateau fracture.  When we do proceed to surgery today, and external fixation system will need to be applied that spans both the right knee and the right ankle.
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
## 2020-04-01 NOTE — Anesthesia Procedure Notes (Signed)
Procedure Name: Intubation Date/Time: 04/01/2020 9:33 AM Performed by: Janace Litten, CRNA Pre-anesthesia Checklist: Patient identified, Emergency Drugs available, Suction available and Patient being monitored Patient Re-evaluated:Patient Re-evaluated prior to induction Oxygen Delivery Method: Circle System Utilized Preoxygenation: Pre-oxygenation with 100% oxygen Induction Type: IV induction and Rapid sequence Ventilation: Mask ventilation without difficulty Laryngoscope Size: Mac and 3 Grade View: Grade I Tube type: Oral Tube size: 7.0 mm Number of attempts: 1 Airway Equipment and Method: Stylet Placement Confirmation: ETT inserted through vocal cords under direct vision,  positive ETCO2 and breath sounds checked- equal and bilateral Secured at: 21 cm Tube secured with: Tape Dental Injury: Teeth and Oropharynx as per pre-operative assessment
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
## 2020-04-01 NOTE — Plan of Care (Signed)
  Problem: Education: Goal: Knowledge of General Education information will improve Description: Including pain rating scale, medication(s)/side effects and non-pharmacologic comfort measures Outcome: Progressing   Problem: Clinical Measurements: Goal: Diagnostic test results will improve Outcome: Progressing   Problem: Coping: Goal: Level of anxiety will decrease Outcome: Progressing   Problem: Pain Managment: Goal: General experience of comfort will improve Outcome: Progressing   Problem: Safety: Goal: Ability to remain free from injury will improve Outcome: Progressing   Problem: Respiratory: Goal: Will maintain a patent airway Outcome: Progressing   Problem: Activity: Goal: Risk for activity intolerance will decrease Outcome: Not Progressing

## 2020-04-01 NOTE — Progress Notes (Signed)
Patient admitted to 5W from PACU. Patient is alert and oriented x4. Vital signs are stable and she is on room air. Patient has complaints of surgical pain in the right knee. Skin is intact, no signs of skin breakdown noted on exam. Patient belongings at bedside (clothing). The patient was shown how to use the call bell. Call bell, phone and bedside table are within reach; bed is in the lowest position.

## 2020-04-01 NOTE — Anesthesia Postprocedure Evaluation (Signed)
Anesthesia Post Note  Patient: Sharman S Perko  Procedure(s) Performed: IRRIGATION AND DEBRIDEMENT ANKLE (Right Ankle) EXTERNAL FIXATION SPANNING KNEE AND ANKLE (Right Leg Lower) COMPLEX WOUND CLOSURE (Left Knee)     Patient location during evaluation: PACU Anesthesia Type: General Level of consciousness: awake and alert Pain management: pain level controlled Vital Signs Assessment: post-procedure vital signs reviewed and stable Respiratory status: spontaneous breathing, nonlabored ventilation, respiratory function stable and patient connected to nasal cannula oxygen Cardiovascular status: blood pressure returned to baseline and stable Postop Assessment: no apparent nausea or vomiting Anesthetic complications: no   No complications documented.  Last Vitals:  Vitals:   04/01/20 1250 04/01/20 1305  BP: 117/73   Pulse: 85 100  Resp: 16 20  Temp:  36.5 C  SpO2: 96% 95%    Last Pain:  Vitals:   04/01/20 1305  TempSrc:   PainSc: Asleep                 Earl Lites P Inayah Woodin

## 2020-04-01 NOTE — Progress Notes (Signed)
Orthopedic Tech Progress Note Patient Details:  Ashley Simon 04-23-1985 [id number]  Ortho Devices Type of Ortho Device: Post (short leg) splint Ortho Device/Splint Location: rle. With the ED Dr's assist in holding I applied a dressing as instructed by the dr. Then I applied a posterior slab splint from proximal tibia to ankle and an additional slab under the foot from toes to heel. Ortho Device/Splint Interventions: Ordered,Application,Adjustment   Post Interventions Patient Tolerated: Well Instructions Provided: Care of device,Adjustment of device   [name] 04/01/2020, 6:14 AM

## 2020-04-01 NOTE — ED Notes (Signed)
Patient transported to CT scan . 

## 2020-04-01 NOTE — ED Notes (Signed)
EDP and ortho tech at bedside applying right leg splint.

## 2020-04-01 NOTE — Transfer of Care (Signed)
Immediate Anesthesia Transfer of Care Note  Patient: Ashley Simon  Procedure(s) Performed: IRRIGATION AND DEBRIDEMENT ANKLE (Right Ankle) EXTERNAL FIXATION SPANNING KNEE AND ANKLE (Right Leg Lower) COMPLEX WOUND CLOSURE (Left Knee)  Patient Location: PACU  Anesthesia Type:General  Level of Consciousness: drowsy, patient cooperative and responds to stimulation  Airway & Oxygen Therapy: Patient Spontanous Breathing  Post-op Assessment: Report given to RN and Post -op Vital signs reviewed and stable  Post vital signs: Reviewed and stable  Last Vitals:  Vitals Value Taken Time  BP    Temp    Pulse    Resp    SpO2      Last Pain:  Vitals:   04/01/20 0914  TempSrc: Temporal  PainSc:          Complications: No complications documented.

## 2020-04-02 ENCOUNTER — Encounter (HOSPITAL_COMMUNITY): Payer: Self-pay | Admitting: Orthopaedic Surgery

## 2020-04-02 ENCOUNTER — Inpatient Hospital Stay (HOSPITAL_COMMUNITY): Payer: Medicaid Other

## 2020-04-02 DIAGNOSIS — F411 Generalized anxiety disorder: Secondary | ICD-10-CM

## 2020-04-02 LAB — URINALYSIS, ROUTINE W REFLEX MICROSCOPIC
Bacteria, UA: NONE SEEN
Bilirubin Urine: NEGATIVE
Glucose, UA: NEGATIVE mg/dL
Ketones, ur: NEGATIVE mg/dL
Nitrite: NEGATIVE
Protein, ur: NEGATIVE mg/dL
Specific Gravity, Urine: 1.011 (ref 1.005–1.030)
pH: 6 (ref 5.0–8.0)

## 2020-04-02 LAB — CBC
HCT: 33 % — ABNORMAL LOW (ref 36.0–46.0)
Hemoglobin: 10.3 g/dL — ABNORMAL LOW (ref 12.0–15.0)
MCH: 27.8 pg (ref 26.0–34.0)
MCHC: 31.2 g/dL (ref 30.0–36.0)
MCV: 88.9 fL (ref 80.0–100.0)
Platelets: 278 10*3/uL (ref 150–400)
RBC: 3.71 MIL/uL — ABNORMAL LOW (ref 3.87–5.11)
RDW: 17.2 % — ABNORMAL HIGH (ref 11.5–15.5)
WBC: 17.8 10*3/uL — ABNORMAL HIGH (ref 4.0–10.5)
nRBC: 0 % (ref 0.0–0.2)

## 2020-04-02 LAB — HIV ANTIBODY (ROUTINE TESTING W REFLEX): HIV Screen 4th Generation wRfx: NONREACTIVE

## 2020-04-02 LAB — BASIC METABOLIC PANEL
Anion gap: 13 (ref 5–15)
BUN: 6 mg/dL (ref 6–20)
CO2: 23 mmol/L (ref 22–32)
Calcium: 8.7 mg/dL — ABNORMAL LOW (ref 8.9–10.3)
Chloride: 102 mmol/L (ref 98–111)
Creatinine, Ser: 0.8 mg/dL (ref 0.44–1.00)
GFR, Estimated: >60 mL/min (ref >60)
Glucose, Bld: 121 mg/dL — ABNORMAL HIGH (ref 70–99)
Potassium: 3.7 mmol/L (ref 3.5–5.1)
Sodium: 138 mmol/L (ref 135–145)

## 2020-04-02 LAB — RAPID URINE DRUG SCREEN, HOSP PERFORMED
Amphetamines: NOT DETECTED
Barbiturates: NOT DETECTED
Benzodiazepines: NOT DETECTED
Cocaine: NOT DETECTED
Opiates: POSITIVE — AB
Tetrahydrocannabinol: NOT DETECTED

## 2020-04-02 LAB — C-REACTIVE PROTEIN: CRP: 12.3 mg/dL — ABNORMAL HIGH (ref <1.0)

## 2020-04-02 MED ORDER — METHOCARBAMOL 1000 MG/10ML IJ SOLN
500.0000 mg | Freq: Four times a day (QID) | INTRAVENOUS | Status: DC
  Filled 2020-04-02: qty 5

## 2020-04-02 MED ORDER — METHOCARBAMOL 500 MG PO TABS
1000.0000 mg | ORAL_TABLET | Freq: Four times a day (QID) | ORAL | Status: DC
  Administered 2020-04-02 – 2020-04-07 (×17): 1000 mg via ORAL
  Filled 2020-04-02 (×18): qty 2

## 2020-04-02 MED ORDER — OXYCODONE HCL 5 MG PO TABS
5.0000 mg | ORAL_TABLET | ORAL | Status: DC | PRN
  Administered 2020-04-02 – 2020-04-03 (×2): 10 mg via ORAL
  Filled 2020-04-02 (×2): qty 2

## 2020-04-02 MED ORDER — ASCORBIC ACID 500 MG PO TABS
1000.0000 mg | ORAL_TABLET | Freq: Every day | ORAL | Status: DC
  Administered 2020-04-02 – 2020-04-07 (×5): 1000 mg via ORAL
  Filled 2020-04-02 (×6): qty 2

## 2020-04-02 MED ORDER — ENOXAPARIN SODIUM 40 MG/0.4ML ~~LOC~~ SOLN
40.0000 mg | SUBCUTANEOUS | Status: DC
  Administered 2020-04-02: 14:00:00 40 mg via SUBCUTANEOUS
  Filled 2020-04-02: qty 0.4

## 2020-04-02 MED ORDER — ADULT MULTIVITAMIN W/MINERALS CH
1.0000 | ORAL_TABLET | Freq: Every day | ORAL | Status: DC
  Administered 2020-04-02 – 2020-04-04 (×3): 1 via ORAL
  Filled 2020-04-02 (×5): qty 1

## 2020-04-02 MED ORDER — HYDROMORPHONE HCL 2 MG PO TABS
2.0000 mg | ORAL_TABLET | ORAL | Status: DC | PRN
  Administered 2020-04-02: 2 mg via ORAL
  Filled 2020-04-02: qty 1

## 2020-04-02 MED ORDER — VITAMIN D 25 MCG (1000 UNIT) PO TABS
2000.0000 [IU] | ORAL_TABLET | Freq: Two times a day (BID) | ORAL | Status: DC
  Administered 2020-04-02 – 2020-04-07 (×10): 2000 [IU] via ORAL
  Filled 2020-04-02 (×11): qty 2

## 2020-04-02 MED ORDER — ACETAMINOPHEN 500 MG PO TABS
1000.0000 mg | ORAL_TABLET | Freq: Four times a day (QID) | ORAL | Status: DC
  Administered 2020-04-02 – 2020-04-07 (×16): 1000 mg via ORAL
  Filled 2020-04-02 (×19): qty 2

## 2020-04-02 MED ORDER — HYDROMORPHONE HCL 1 MG/ML IJ SOLN
0.5000 mg | INTRAMUSCULAR | Status: DC | PRN
  Administered 2020-04-02 – 2020-04-03 (×6): 1 mg via INTRAVENOUS
  Filled 2020-04-02 (×6): qty 1

## 2020-04-02 MED ORDER — GABAPENTIN 100 MG PO CAPS
200.0000 mg | ORAL_CAPSULE | Freq: Three times a day (TID) | ORAL | Status: DC
Start: 2020-04-02 — End: 2020-04-07
  Administered 2020-04-02 – 2020-04-07 (×13): 200 mg via ORAL
  Filled 2020-04-02 (×14): qty 2
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
## 2020-04-03 ENCOUNTER — Inpatient Hospital Stay (HOSPITAL_COMMUNITY): Payer: Medicaid Other

## 2020-04-03 ENCOUNTER — Encounter (HOSPITAL_COMMUNITY)
Admission: EM | Disposition: A | Discharge status: Home / Self Care | Payer: Self-pay | Source: Home / Self Care | Attending: Orthopedic Surgery

## 2020-04-03 ENCOUNTER — Inpatient Hospital Stay (HOSPITAL_COMMUNITY): Payer: Medicaid Other | Admitting: Anesthesiology

## 2020-04-03 HISTORY — PX: ORIF TIBIA PLATEAU: SHX2132

## 2020-04-03 HISTORY — PX: OPEN REDUCTION INTERNAL FIXATION (ORIF) TIBIA/FIBULA FRACTURE: SHX5992

## 2020-04-03 HISTORY — PX: I & D EXTREMITY: SHX5045

## 2020-04-03 LAB — CBC WITH DIFFERENTIAL/PLATELET
Abs Immature Granulocytes: 0.05 10*3/uL (ref 0.00–0.07)
Basophils Absolute: 0.1 10*3/uL (ref 0.0–0.1)
Basophils Relative: 0 %
Eosinophils Absolute: 0.1 10*3/uL (ref 0.0–0.5)
Eosinophils Relative: 1 %
HCT: 29.8 % — ABNORMAL LOW (ref 36.0–46.0)
Hemoglobin: 8.8 g/dL — ABNORMAL LOW (ref 12.0–15.0)
Immature Granulocytes: 0 %
Lymphocytes Relative: 23 %
Lymphs Abs: 2.7 10*3/uL (ref 0.7–4.0)
MCH: 26.7 pg (ref 26.0–34.0)
MCHC: 29.5 g/dL — ABNORMAL LOW (ref 30.0–36.0)
MCV: 90.3 fL (ref 80.0–100.0)
Monocytes Absolute: 1.7 10*3/uL — ABNORMAL HIGH (ref 0.1–1.0)
Monocytes Relative: 14 %
Neutro Abs: 7.1 10*3/uL (ref 1.7–7.7)
Neutrophils Relative %: 62 %
Platelets: 204 10*3/uL (ref 150–400)
RBC: 3.3 MIL/uL — ABNORMAL LOW (ref 3.87–5.11)
RDW: 17.1 % — ABNORMAL HIGH (ref 11.5–15.5)
WBC: 11.8 10*3/uL — ABNORMAL HIGH (ref 4.0–10.5)
nRBC: 0 % (ref 0.0–0.2)

## 2020-04-03 LAB — COMPREHENSIVE METABOLIC PANEL
ALT: 25 U/L (ref 0–44)
AST: 26 U/L (ref 15–41)
Albumin: 2.8 g/dL — ABNORMAL LOW (ref 3.5–5.0)
Alkaline Phosphatase: 66 U/L (ref 38–126)
Anion gap: 9 (ref 5–15)
BUN: 10 mg/dL (ref 6–20)
CO2: 25 mmol/L (ref 22–32)
Calcium: 8.3 mg/dL — ABNORMAL LOW (ref 8.9–10.3)
Chloride: 107 mmol/L (ref 98–111)
Creatinine, Ser: 0.81 mg/dL (ref 0.44–1.00)
GFR, Estimated: >60 mL/min (ref >60)
Glucose, Bld: 102 mg/dL — ABNORMAL HIGH (ref 70–99)
Potassium: 3.7 mmol/L (ref 3.5–5.1)
Sodium: 141 mmol/L (ref 135–145)
Total Bilirubin: 0.5 mg/dL (ref 0.3–1.2)
Total Protein: 5.7 g/dL — ABNORMAL LOW (ref 6.5–8.1)

## 2020-04-03 LAB — POCT I-STAT EG7
Acid-Base Excess: 1 mmol/L (ref 0.0–2.0)
Bicarbonate: 23.2 mmol/L (ref 20.0–28.0)
Calcium, Ion: 1.16 mmol/L (ref 1.15–1.40)
HCT: 21 % — ABNORMAL LOW (ref 36.0–46.0)
Hemoglobin: 7.1 g/dL — ABNORMAL LOW (ref 12.0–15.0)
O2 Saturation: 95 %
Potassium: 5 mmol/L (ref 3.5–5.1)
Sodium: 140 mmol/L (ref 135–145)
TCO2: 24 mmol/L (ref 22–32)
pCO2, Ven: 24.9 mmHg — ABNORMAL LOW (ref 44.0–60.0)
pH, Ven: 7.577 — ABNORMAL HIGH (ref 7.250–7.430)
pO2, Ven: 61 mmHg — ABNORMAL HIGH (ref 32.0–45.0)

## 2020-04-03 LAB — HEMOGLOBIN A1C
Hgb A1c MFr Bld: 5.6 % (ref 4.8–5.6)
Mean Plasma Glucose: 114.02 mg/dL

## 2020-04-03 LAB — VITAMIN D 25 HYDROXY (VIT D DEFICIENCY, FRACTURES): Vit D, 25-Hydroxy: 9.79 ng/mL — ABNORMAL LOW (ref 30–100)

## 2020-04-03 SURGERY — IRRIGATION AND DEBRIDEMENT EXTREMITY
Anesthesia: General | Site: Ankle | Laterality: Right

## 2020-04-03 MED ORDER — CEFAZOLIN SODIUM-DEXTROSE 2-3 GM-%(50ML) IV SOLR
INTRAVENOUS | Status: DC | PRN
  Administered 2020-04-03 (×2): 2 g via INTRAVENOUS

## 2020-04-03 MED ORDER — ESMOLOL HCL 100 MG/10ML IV SOLN
INTRAVENOUS | Status: DC | PRN
  Administered 2020-04-03: 10 mg via INTRAVENOUS

## 2020-04-03 MED ORDER — LACTATED RINGERS IV SOLN: INTRAVENOUS | Status: DC | PRN

## 2020-04-03 MED ORDER — HYDROMORPHONE HCL 1 MG/ML IJ SOLN
0.5000 mg | INTRAMUSCULAR | Status: DC | PRN
  Administered 2020-04-03: 1 mg via INTRAVENOUS
  Administered 2020-04-03: 0.5 mg via INTRAVENOUS
  Administered 2020-04-04 – 2020-04-05 (×10): 1 mg via INTRAVENOUS
  Administered 2020-04-05: 0.5 mg via INTRAVENOUS
  Administered 2020-04-05 – 2020-04-07 (×2): 1 mg via INTRAVENOUS
  Filled 2020-04-03 (×3): qty 1
  Filled 2020-04-03: qty 0.5
  Filled 2020-04-03 (×10): qty 1
  Filled 2020-04-03: qty 0.5
  Filled 2020-04-03 (×2): qty 1

## 2020-04-03 MED ORDER — PROMETHAZINE HCL 25 MG/ML IJ SOLN
INTRAMUSCULAR | Status: AC
  Filled 2020-04-03: qty 1

## 2020-04-03 MED ORDER — DEXMEDETOMIDINE (PRECEDEX) IN NS 20 MCG/5ML (4 MCG/ML) IV SYRINGE
PREFILLED_SYRINGE | INTRAVENOUS | Status: DC | PRN
  Administered 2020-04-03 (×2): 4 ug via INTRAVENOUS
  Administered 2020-04-03: 8 ug via INTRAVENOUS
  Administered 2020-04-03: 4 ug via INTRAVENOUS

## 2020-04-03 MED ORDER — PROPOFOL 10 MG/ML IV BOLUS
INTRAVENOUS | Status: DC | PRN
  Administered 2020-04-03: 140 mg via INTRAVENOUS

## 2020-04-03 MED ORDER — LIDOCAINE 2% (20 MG/ML) 5 ML SYRINGE
INTRAMUSCULAR | Status: AC
  Filled 2020-04-03: qty 5

## 2020-04-03 MED ORDER — SUFENTANIL CITRATE 50 MCG/ML IV SOLN
INTRAVENOUS | Status: AC
  Filled 2020-04-03: qty 1

## 2020-04-03 MED ORDER — HYDROMORPHONE HCL 2 MG PO TABS
2.0000 mg | ORAL_TABLET | ORAL | Status: DC | PRN
  Administered 2020-04-04: 4 mg via ORAL
  Administered 2020-04-04: 2 mg via ORAL
  Administered 2020-04-05 – 2020-04-06 (×7): 4 mg via ORAL
  Filled 2020-04-03 (×4): qty 2
  Filled 2020-04-03: qty 1
  Filled 2020-04-03 (×5): qty 2

## 2020-04-03 MED ORDER — FENTANYL CITRATE (PF) 250 MCG/5ML IJ SOLN
INTRAMUSCULAR | Status: DC | PRN
  Administered 2020-04-03 (×2): 25 ug via INTRAVENOUS

## 2020-04-03 MED ORDER — PHENYLEPHRINE 40 MCG/ML (10ML) SYRINGE FOR IV PUSH (FOR BLOOD PRESSURE SUPPORT)
PREFILLED_SYRINGE | INTRAVENOUS | Status: AC
  Filled 2020-04-03: qty 10

## 2020-04-03 MED ORDER — PHENYLEPHRINE HCL (PRESSORS) 10 MG/ML IV SOLN
INTRAVENOUS | Status: DC | PRN
  Administered 2020-04-03 (×2): 80 ug via INTRAVENOUS
  Administered 2020-04-03: 120 ug via INTRAVENOUS
  Administered 2020-04-03: 80 ug via INTRAVENOUS

## 2020-04-03 MED ORDER — ONDANSETRON HCL 4 MG/2ML IJ SOLN
INTRAMUSCULAR | Status: DC | PRN
  Administered 2020-04-03: 4 mg via INTRAVENOUS

## 2020-04-03 MED ORDER — 0.9 % SODIUM CHLORIDE (POUR BTL) OPTIME
TOPICAL | Status: DC | PRN
  Administered 2020-04-03: 14:00:00 1000 mL

## 2020-04-03 MED ORDER — SUGAMMADEX SODIUM 500 MG/5ML IV SOLN
INTRAVENOUS | Status: AC
  Filled 2020-04-03: qty 5

## 2020-04-03 MED ORDER — VANCOMYCIN HCL 1000 MG IV SOLR
INTRAVENOUS | Status: DC | PRN
  Administered 2020-04-03: 1000 mg

## 2020-04-03 MED ORDER — HYDROXYZINE HCL 25 MG PO TABS
25.0000 mg | ORAL_TABLET | Freq: Four times a day (QID) | ORAL | Status: DC | PRN
  Administered 2020-04-03 – 2020-04-04 (×4): 25 mg via ORAL
  Filled 2020-04-03 (×4): qty 1

## 2020-04-03 MED ORDER — ROCURONIUM 10MG/ML (10ML) SYRINGE FOR MEDFUSION PUMP - OPTIME
INTRAVENOUS | Status: DC | PRN
  Administered 2020-04-03: 40 mg via INTRAVENOUS
  Administered 2020-04-03: 20 mg via INTRAVENOUS
  Administered 2020-04-03: 60 mg via INTRAVENOUS

## 2020-04-03 MED ORDER — VANCOMYCIN HCL 1000 MG IV SOLR
INTRAVENOUS | Status: AC
  Filled 2020-04-03: qty 1000

## 2020-04-03 MED ORDER — SODIUM CHLORIDE 0.9 % IR SOLN
Status: DC | PRN
  Administered 2020-04-03: 3000 mL

## 2020-04-03 MED ORDER — SUFENTANIL CITRATE 50 MCG/ML IV SOLN
INTRAVENOUS | Status: DC | PRN
  Administered 2020-04-03 (×5): 10 ug via INTRAVENOUS

## 2020-04-03 MED ORDER — PROPOFOL 10 MG/ML IV BOLUS
INTRAVENOUS | Status: AC
  Filled 2020-04-03: qty 20

## 2020-04-03 MED ORDER — SUGAMMADEX SODIUM 200 MG/2ML IV SOLN
INTRAVENOUS | Status: DC | PRN
  Administered 2020-04-03 (×2): 100 mg via INTRAVENOUS

## 2020-04-03 MED ORDER — CEFTRIAXONE SODIUM 2 G IJ SOLR
2.0000 g | INTRAMUSCULAR | Status: AC
  Administered 2020-04-03 – 2020-04-04 (×2): 2 g via INTRAVENOUS
  Filled 2020-04-03 (×2): qty 20

## 2020-04-03 MED ORDER — DEXAMETHASONE SODIUM PHOSPHATE 10 MG/ML IJ SOLN
INTRAMUSCULAR | Status: DC | PRN
  Administered 2020-04-03: 10 mg via INTRAVENOUS

## 2020-04-03 MED ORDER — ONDANSETRON HCL 4 MG/2ML IJ SOLN
INTRAMUSCULAR | Status: AC
  Filled 2020-04-03: qty 2

## 2020-04-03 MED ORDER — ROCURONIUM BROMIDE 10 MG/ML (PF) SYRINGE
PREFILLED_SYRINGE | INTRAVENOUS | Status: AC
  Filled 2020-04-03: qty 10

## 2020-04-03 MED ORDER — FENTANYL CITRATE (PF) 100 MCG/2ML IJ SOLN
INTRAMUSCULAR | Status: AC
  Administered 2020-04-03: 19:00:00 50 ug via INTRAVENOUS
  Filled 2020-04-03: qty 2

## 2020-04-03 MED ORDER — FENTANYL CITRATE (PF) 100 MCG/2ML IJ SOLN
25.0000 ug | INTRAMUSCULAR | Status: DC | PRN
  Administered 2020-04-03: 50 ug via INTRAVENOUS

## 2020-04-03 MED ORDER — PHENYLEPHRINE HCL-NACL 10-0.9 MG/250ML-% IV SOLN
INTRAVENOUS | Status: DC | PRN
  Administered 2020-04-03: 20 ug/min via INTRAVENOUS

## 2020-04-03 MED ORDER — GLYCOPYRROLATE 0.2 MG/ML IJ SOLN
INTRAMUSCULAR | Status: DC | PRN
  Administered 2020-04-03: .2 mg via INTRAVENOUS

## 2020-04-03 MED ORDER — FENTANYL CITRATE (PF) 250 MCG/5ML IJ SOLN
INTRAMUSCULAR | Status: AC
  Filled 2020-04-03: qty 5

## 2020-04-03 MED ORDER — KETAMINE HCL 10 MG/ML IJ SOLN
INTRAMUSCULAR | Status: DC | PRN
  Administered 2020-04-03: 20 mg via INTRAVENOUS
  Administered 2020-04-03 (×3): 10 mg via INTRAVENOUS

## 2020-04-03 MED ORDER — ALBUMIN HUMAN 5 % IV SOLN: INTRAVENOUS | Status: DC | PRN

## 2020-04-03 MED ORDER — PROMETHAZINE HCL 25 MG/ML IJ SOLN: 6.2500 mg | INTRAMUSCULAR | Status: DC | PRN

## 2020-04-03 MED ORDER — DEXAMETHASONE SODIUM PHOSPHATE 10 MG/ML IJ SOLN
INTRAMUSCULAR | Status: AC
  Filled 2020-04-03: qty 1

## 2020-04-03 MED ORDER — KETAMINE HCL 50 MG/5ML IJ SOSY
PREFILLED_SYRINGE | INTRAMUSCULAR | Status: AC
  Filled 2020-04-03: qty 5

## 2020-04-03 MED ORDER — LIDOCAINE HCL (CARDIAC) PF 100 MG/5ML IV SOSY
PREFILLED_SYRINGE | INTRAVENOUS | Status: DC | PRN
  Administered 2020-04-03: 100 mg via INTRAVENOUS

## 2020-04-03 MED ORDER — MIDAZOLAM HCL 2 MG/2ML IJ SOLN
INTRAMUSCULAR | Status: AC
  Filled 2020-04-03: qty 2

## 2020-04-03 MED ORDER — MIDAZOLAM HCL 2 MG/2ML IJ SOLN
INTRAMUSCULAR | Status: DC | PRN
  Administered 2020-04-03: 2 mg via INTRAVENOUS

## 2020-04-03 SURGICAL SUPPLY — 132 items
BANDAGE ESMARK 6X9 LF (GAUZE/BANDAGES/DRESSINGS) ×2 IMPLANT
BIT DRILL 100X2.5XANTM LCK (BIT) ×1 IMPLANT
BIT DRILL 2.4X140 LONG SOLID (BIT) ×2 IMPLANT
BIT DRILL 2.5X2.75 QC CALB (BIT) ×2 IMPLANT
BIT DRILL CAL (BIT) ×1 IMPLANT
BIT DRILL CALIBRATED 2.7 (BIT) ×1 IMPLANT
BIT DRILL CALIBRATED 2.7MM (BIT) ×1
BIT DRILL SOLID 2.0 X 110MM (DRILL) IMPLANT
BIT DRL 100X2.5XANTM LCK (BIT) ×2
BLADE CLIPPER SURG (BLADE) IMPLANT
BLADE SURG 10 STRL SS (BLADE) ×4 IMPLANT
BLADE SURG 15 STRL LF DISP TIS (BLADE) ×2 IMPLANT
BLADE SURG 15 STRL SS (BLADE) ×4
BNDG CMPR 9X6 STRL LF SNTH (GAUZE/BANDAGES/DRESSINGS) ×2
BNDG COHESIVE 4X5 TAN STRL (GAUZE/BANDAGES/DRESSINGS) ×4 IMPLANT
BNDG ELASTIC 4X5.8 VLCR STR LF (GAUZE/BANDAGES/DRESSINGS) ×4 IMPLANT
BNDG ELASTIC 6X5.8 VLCR STR LF (GAUZE/BANDAGES/DRESSINGS) ×4 IMPLANT
BNDG ESMARK 6X9 LF (GAUZE/BANDAGES/DRESSINGS) ×4
BNDG GAUZE ELAST 4 BULKY (GAUZE/BANDAGES/DRESSINGS) ×8 IMPLANT
BONE CANC CHIPS 40CC CAN1/2 (Bone Implant) ×8 IMPLANT
BONE CEMENT GENTAMICIN (Cement) ×4 IMPLANT
BRUSH SCRUB EZ PLAIN DRY (MISCELLANEOUS) ×8 IMPLANT
CANISTER SUCT 3000ML PPV (MISCELLANEOUS) ×4 IMPLANT
CEMENT BONE GENTAMICIN 40 (Cement) IMPLANT
CHIPS CANC BONE 40CC CAN1/2 (Bone Implant) ×4 IMPLANT
COVER SURGICAL LIGHT HANDLE (MISCELLANEOUS) ×8 IMPLANT
COVER WAND RF STERILE (DRAPES) ×4 IMPLANT
CUFF TOURN SGL QUICK 34 (TOURNIQUET CUFF) ×4
CUFF TRNQT CYL 34X4.125X (TOURNIQUET CUFF) ×2 IMPLANT
DRAPE C-ARM 42X72 X-RAY (DRAPES) ×4 IMPLANT
DRAPE C-ARMOR (DRAPES) ×4 IMPLANT
DRAPE HALF SHEET 40X57 (DRAPES) IMPLANT
DRAPE INCISE IOBAN 66X45 STRL (DRAPES) ×4 IMPLANT
DRAPE U-SHAPE 47X51 STRL (DRAPES) ×4 IMPLANT
DRILL BIT 2.5MM (BIT) ×4
DRILL BIT CAL (BIT) ×4
DRILL SOLID 2.0 X 110MM (DRILL) ×4
DRSG ADAPTIC 3X8 NADH LF (GAUZE/BANDAGES/DRESSINGS) ×4 IMPLANT
DRSG MEPILEX BORDER 4X4 (GAUZE/BANDAGES/DRESSINGS) ×3 IMPLANT
DRSG MEPITEL 4X7.2 (GAUZE/BANDAGES/DRESSINGS) ×9 IMPLANT
DRSG PAD ABDOMINAL 8X10 ST (GAUZE/BANDAGES/DRESSINGS) ×10 IMPLANT
ELECT REM PT RETURN 9FT ADLT (ELECTROSURGICAL) ×4
ELECTRODE REM PT RTRN 9FT ADLT (ELECTROSURGICAL) ×2 IMPLANT
GAUZE SPONGE 4X4 12PLY STRL (GAUZE/BANDAGES/DRESSINGS) ×7 IMPLANT
GLOVE BIO SURGEON STRL SZ7.5 (GLOVE) ×4 IMPLANT
GLOVE BIO SURGEON STRL SZ8 (GLOVE) ×4 IMPLANT
GLOVE BIOGEL PI IND STRL 7.5 (GLOVE) ×2 IMPLANT
GLOVE BIOGEL PI IND STRL 8 (GLOVE) ×2 IMPLANT
GLOVE BIOGEL PI IND STRL 9 (GLOVE) ×2 IMPLANT
GLOVE BIOGEL PI INDICATOR 7.5 (GLOVE) ×4
GLOVE BIOGEL PI INDICATOR 8 (GLOVE) ×2
GLOVE BIOGEL PI INDICATOR 9 (GLOVE) ×2
GLOVE ECLIPSE 6.5 STRL STRAW (GLOVE) ×6 IMPLANT
GLOVE SURG UNDER POLY LF SZ6.5 (GLOVE) ×2 IMPLANT
GLOVE SURG UNDER POLY LF SZ7 (GLOVE) ×2 IMPLANT
GOWN STRL REUS W/ TWL LRG LVL3 (GOWN DISPOSABLE) ×4 IMPLANT
GOWN STRL REUS W/ TWL XL LVL3 (GOWN DISPOSABLE) ×2 IMPLANT
GOWN STRL REUS W/TWL LRG LVL3 (GOWN DISPOSABLE) ×8
GOWN STRL REUS W/TWL XL LVL3 (GOWN DISPOSABLE) ×4
GRAFT BNE CHIP CANC 1-8 40 (Bone Implant) IMPLANT
HANDPIECE INTERPULSE COAX TIP (DISPOSABLE)
IMMOBILIZER KNEE 22 UNIV (SOFTGOODS) ×4 IMPLANT
K-WIRE ACE 1.6X6 (WIRE) ×40
KIT BASIN OR (CUSTOM PROCEDURE TRAY) ×4 IMPLANT
KIT TURNOVER KIT B (KITS) ×4 IMPLANT
KWIRE ACE 1.6X6 (WIRE) ×10 IMPLANT
MANIFOLD NEPTUNE II (INSTRUMENTS) ×4 IMPLANT
NDL SUT 6 .5 CRC .975X.05 MAYO (NEEDLE) IMPLANT
NEEDLE MAYO TAPER (NEEDLE)
NS IRRIG 1000ML POUR BTL (IV SOLUTION) ×4 IMPLANT
PACK ORTHO EXTREMITY (CUSTOM PROCEDURE TRAY) ×4 IMPLANT
PAD ARMBOARD 7.5X6 YLW CONV (MISCELLANEOUS) ×8 IMPLANT
PAD CAST 4YDX4 CTTN HI CHSV (CAST SUPPLIES) ×2 IMPLANT
PADDING CAST COTTON 4X4 STRL (CAST SUPPLIES) ×8
PADDING CAST COTTON 6X4 STRL (CAST SUPPLIES) ×6 IMPLANT
PIN TRANSFIXING 5.0 (EXFIX) ×3 IMPLANT
PLATE FIB ANAT 15H RT (Plate) ×2 IMPLANT
PLATE LOCK 5H STD RT PROX TIB (Plate) ×2 IMPLANT
PLATE TIB NARROW 6H RT (Connector) ×2 IMPLANT
SCREW CORT 3.5X26 (Screw) ×4 IMPLANT
SCREW CORT FT 32X3.5XNONLOCK (Screw) ×1 IMPLANT
SCREW CORT T15 26X3.5XST LCK (Screw) ×1 IMPLANT
SCREW CORTICAL 3.5MM  32MM (Screw) ×4 IMPLANT
SCREW CORTICAL 3.5MM  34MM (Screw) ×4 IMPLANT
SCREW CORTICAL 3.5MM 26MM (Screw) ×4 IMPLANT
SCREW CORTICAL 3.5MM 32MM (Screw) ×2 IMPLANT
SCREW CORTICAL 3.5MM 34MM (Screw) IMPLANT
SCREW CORTICAL 3.5MM 36MM (Screw) ×2 IMPLANT
SCREW LOCK CORT STAR 3.5X22 (Screw) ×3 IMPLANT
SCREW LOCK CORT STAR 3.5X32 (Screw) ×6 IMPLANT
SCREW LOCK CORT STAR 3.5X34 (Screw) ×2 IMPLANT
SCREW LOCK CORT STAR 3.5X38 (Screw) ×3 IMPLANT
SCREW LOCK CORT STAR 3.5X40 (Screw) ×6 IMPLANT
SCREW LOCK CORT STAR 3.5X42 (Screw) ×3 IMPLANT
SCREW LOCK CORT STAR 3.5X60 (Screw) ×6 IMPLANT
SCREW LOCK CORT STAR 3.5X65 (Screw) ×4 IMPLANT
SCREW LOCK CORT STAR 3.5X70 (Screw) ×6 IMPLANT
SCREW LOCK PLATE R3 2.7X11 (Screw) ×3 IMPLANT
SCREW LOCK PLATE R3 2.7X15 (Screw) ×6 IMPLANT
SCREW LOCK PLATE R3 2.7X18 (Screw) ×2 IMPLANT
SCREW LP 3.5X60MM (Screw) ×2 IMPLANT
SCREW LP 3.5X65MM (Screw) ×2 IMPLANT
SCREW NON LOCKING 3.5X12 (Screw) ×8 IMPLANT
SET HNDPC FAN SPRY TIP SCT (DISPOSABLE) IMPLANT
SOL PREP POV-IOD 4OZ 10% (MISCELLANEOUS) ×4 IMPLANT
SOL PREP PROV IODINE SCRUB 4OZ (MISCELLANEOUS) ×4 IMPLANT
SPONGE LAP 18X18 RF (DISPOSABLE) ×6 IMPLANT
STAPLER VISISTAT 35W (STAPLE) ×4 IMPLANT
STOCKINETTE IMPERVIOUS 9X36 MD (GAUZE/BANDAGES/DRESSINGS) IMPLANT
STOCKINETTE IMPERVIOUS LG (DRAPES) ×4 IMPLANT
SUCTION FRAZIER HANDLE 10FR (MISCELLANEOUS) ×8
SUCTION TUBE FRAZIER 10FR DISP (MISCELLANEOUS) ×3 IMPLANT
SUT BONE WAX W31G (SUTURE) ×2 IMPLANT
SUT ETHILON 2 0 PSLX (SUTURE) ×15 IMPLANT
SUT ETHILON 3 0 PS 1 (SUTURE) IMPLANT
SUT PDS AB 2-0 CT1 27 (SUTURE) ×8 IMPLANT
SUT PROLENE 0 CT 2 (SUTURE) ×8 IMPLANT
SUT VIC AB 0 CT1 27 (SUTURE)
SUT VIC AB 0 CT1 27XBRD ANBCTR (SUTURE) ×1 IMPLANT
SUT VIC AB 1 CT1 27 (SUTURE)
SUT VIC AB 1 CT1 27XBRD ANBCTR (SUTURE) ×1 IMPLANT
SUT VIC AB 2-0 CT1 27 (SUTURE) ×8
SUT VIC AB 2-0 CT1 TAPERPNT 27 (SUTURE) ×4 IMPLANT
SYR 5ML LUER SLIP (SYRINGE) ×14 IMPLANT
TOWEL GREEN STERILE (TOWEL DISPOSABLE) ×8 IMPLANT
TOWEL GREEN STERILE FF (TOWEL DISPOSABLE) ×4 IMPLANT
TRAY FOLEY MTR SLVR 16FR STAT (SET/KITS/TRAYS/PACK) IMPLANT
TUBE CONNECTING 12'X1/4 (SUCTIONS) ×1
TUBE CONNECTING 12X1/4 (SUCTIONS) ×3 IMPLANT
UNDERPAD 30X36 HEAVY ABSORB (UNDERPADS AND DIAPERS) ×4 IMPLANT
WATER STERILE IRR 1000ML POUR (IV SOLUTION) ×8 IMPLANT
YANKAUER SUCT BULB TIP NO VENT (SUCTIONS) ×4 IMPLANT

## 2020-04-03 NOTE — Anesthesia Procedure Notes (Signed)
Procedure Name: Intubation Date/Time: 04/03/2020 12:55 PM Performed by: Claris Che, CRNA Pre-anesthesia Checklist: Patient identified, Emergency Drugs available, Suction available, Patient being monitored and Timeout performed Patient Re-evaluated:Patient Re-evaluated prior to induction Oxygen Delivery Method: Circle system utilized Preoxygenation: Pre-oxygenation with 100% oxygen Induction Type: IV induction and Cricoid Pressure applied Ventilation: Mask ventilation without difficulty Laryngoscope Size: Mac and 4 Grade View: Grade II Tube type: Oral Tube size: 7.5 mm Number of attempts: 1 Airway Equipment and Method: Stylet Placement Confirmation: ETT inserted through vocal cords under direct vision,  positive ETCO2 and breath sounds checked- equal and bilateral Secured at: 22 cm Tube secured with: Tape Dental Injury: Teeth and Oropharynx as per pre-operative assessment

## 2020-04-03 NOTE — Transfer of Care (Signed)
Immediate Anesthesia Transfer of Care Note  Patient: Ashley Simon  Procedure(s) Performed: IRRIGATION AND DEBRIDEMENT OF THE RIGHT ANKLE (Right Ankle) OPEN REDUCTION INTERNAL FIXATION (ORIF) TIBIAL PLATEAU (Right ) OPEN REDUCTION INTERNAL FIXATION (ORIF) TIBIA/FIBULA FRACTURE (Right Ankle)  Patient Location:  OR with PACU  nurses  Anesthesia Type:General  Level of Consciousness: drowsy and patient cooperative  Airway & Oxygen Therapy: Patient Spontanous Breathing and Patient connected to face mask oxygen  Post-op Assessment: Report given to RN, Post -op Vital signs reviewed and stable and Patient moving all extremities  Post vital signs: Reviewed and stable  Last Vitals:  Vitals Value Taken Time  BP 111/92   Temp    Pulse 96   Resp 14   SpO2 100     Last Pain:  Vitals:   04/03/20 1205  TempSrc:   PainSc: 2       Patients Stated Pain Goal: 1 (04/03/20 0750)  Complications: No complications documented.

## 2020-04-03 NOTE — Progress Notes (Signed)
Orthopedic Tech Progress Note Patient Details:  Ashley Simon Aug 31, 1985 [id number] Called order into [facility] Patient ID: Ashley Simon, female   DOB: [birth year], 34 y.o.   MRN: [id number]   Ashley Simon 04/03/2020, 8:23 PM
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
## 2020-04-03 NOTE — Plan of Care (Signed)
°  Problem: Pain Managment: Goal: General experience of comfort will improve 04/03/2020 0518 by Linus Galas, RN Outcome: Progressing 04/03/2020 0450 by Linus Galas, RN Outcome: Not Progressing   Problem: Safety: Goal: Ability to remain free from injury will improve Outcome: Progressing   Problem: Respiratory: Goal: Will maintain a patent airway Outcome: Progressing   Problem: Respiratory: Goal: Will maintain a patent airway Outcome: Progressing

## 2020-04-03 NOTE — Anesthesia Postprocedure Evaluation (Signed)
Anesthesia Post Note  Patient: Ashley Simon  Procedure(s) Performed: IRRIGATION AND DEBRIDEMENT OF THE RIGHT ANKLE (Right Ankle) OPEN REDUCTION INTERNAL FIXATION (ORIF) TIBIAL PLATEAU (Right ) OPEN REDUCTION INTERNAL FIXATION (ORIF) TIBIA/FIBULA FRACTURE (Right Ankle)     Patient location during evaluation: PACU Anesthesia Type: General Level of consciousness: awake Pain management: pain level controlled Vital Signs Assessment: post-procedure vital signs reviewed and stable Respiratory status: spontaneous breathing Cardiovascular status: stable Postop Assessment: no apparent nausea or vomiting Anesthetic complications: no   No complications documented.  Last Vitals:  Vitals:   04/03/20 1936 04/03/20 1956  BP: 132/74 132/84  Pulse: (!) 101 (!) 111  Resp: (!) 21 18  Temp: 36.6 C 36.4 C  SpO2: 95% 96%    Last Pain:  Vitals:   04/03/20 2000  TempSrc:   PainSc: 9                  Jullisa Grigoryan

## 2020-04-04 ENCOUNTER — Encounter (HOSPITAL_COMMUNITY): Payer: Self-pay | Admitting: Orthopedic Surgery

## 2020-04-04 ENCOUNTER — Inpatient Hospital Stay (HOSPITAL_COMMUNITY): Payer: Medicaid Other

## 2020-04-04 LAB — CBC
HCT: 29.8 % — ABNORMAL LOW (ref 36.0–46.0)
Hemoglobin: 9.1 g/dL — ABNORMAL LOW (ref 12.0–15.0)
MCH: 27.2 pg (ref 26.0–34.0)
MCHC: 30.5 g/dL (ref 30.0–36.0)
MCV: 89.2 fL (ref 80.0–100.0)
Platelets: 252 10*3/uL (ref 150–400)
RBC: 3.34 MIL/uL — ABNORMAL LOW (ref 3.87–5.11)
RDW: 17.2 % — ABNORMAL HIGH (ref 11.5–15.5)
WBC: 15.3 10*3/uL — ABNORMAL HIGH (ref 4.0–10.5)
nRBC: 0 % (ref 0.0–0.2)

## 2020-04-04 LAB — COMPREHENSIVE METABOLIC PANEL
ALT: 23 U/L (ref 0–44)
AST: 32 U/L (ref 15–41)
Albumin: 3.3 g/dL — ABNORMAL LOW (ref 3.5–5.0)
Alkaline Phosphatase: 62 U/L (ref 38–126)
Anion gap: 12 (ref 5–15)
BUN: <5 mg/dL — ABNORMAL LOW (ref 6–20)
CO2: 23 mmol/L (ref 22–32)
Calcium: 8.9 mg/dL (ref 8.9–10.3)
Chloride: 101 mmol/L (ref 98–111)
Creatinine, Ser: 0.87 mg/dL (ref 0.44–1.00)
GFR, Estimated: >60 mL/min (ref >60)
Glucose, Bld: 228 mg/dL — ABNORMAL HIGH (ref 70–99)
Potassium: 3.7 mmol/L (ref 3.5–5.1)
Sodium: 136 mmol/L (ref 135–145)
Total Bilirubin: 0.5 mg/dL (ref 0.3–1.2)
Total Protein: 6.6 g/dL (ref 6.5–8.1)

## 2020-04-04 MED ORDER — ALBUTEROL SULFATE HFA 108 (90 BASE) MCG/ACT IN AERS
2.0000 | INHALATION_SPRAY | Freq: Once | RESPIRATORY_TRACT | Status: AC | PRN
  Administered 2020-04-04: 12:00:00 2 via RESPIRATORY_TRACT
  Filled 2020-04-04: qty 6.7

## 2020-04-04 MED ORDER — SODIUM CHLORIDE 0.9 % IV SOLN: INTRAVENOUS | Status: DC | PRN

## 2020-04-04 MED ORDER — DIPHENHYDRAMINE HCL 50 MG/ML IJ SOLN: 50.0000 mg | Freq: Once | INTRAMUSCULAR | Status: DC | PRN

## 2020-04-04 MED ORDER — LEVALBUTEROL TARTRATE 45 MCG/ACT IN AERO
1.0000 | INHALATION_SPRAY | Freq: Four times a day (QID) | RESPIRATORY_TRACT | Status: DC | PRN
  Administered 2020-04-04: 15:00:00 2 via RESPIRATORY_TRACT
  Filled 2020-04-04: qty 15

## 2020-04-04 MED ORDER — VITAMIN D (ERGOCALCIFEROL) 1.25 MG (50000 UNIT) PO CAPS
50000.0000 [IU] | ORAL_CAPSULE | ORAL | Status: DC
  Administered 2020-04-04: 12:00:00 50000 [IU] via ORAL
  Filled 2020-04-04: qty 1

## 2020-04-04 MED ORDER — EPINEPHRINE 0.3 MG/0.3ML IJ SOAJ
0.3000 mg | Freq: Once | INTRAMUSCULAR | Status: DC | PRN
  Filled 2020-04-04: qty 0.6

## 2020-04-04 MED ORDER — SODIUM CHLORIDE 0.9 % IV SOLN
Freq: Once | INTRAVENOUS | Status: DC
  Filled 2020-04-04: qty 20

## 2020-04-04 MED ORDER — ZINC SULFATE 220 (50 ZN) MG PO CAPS
220.0000 mg | ORAL_CAPSULE | Freq: Every day | ORAL | Status: DC
  Administered 2020-04-04 – 2020-04-07 (×3): 220 mg via ORAL
  Filled 2020-04-04 (×4): qty 1

## 2020-04-04 MED ORDER — METHYLPREDNISOLONE SODIUM SUCC 125 MG IJ SOLR: 125.0000 mg | Freq: Once | INTRAMUSCULAR | Status: DC | PRN

## 2020-04-04 MED ORDER — FAMOTIDINE IN NACL 20-0.9 MG/50ML-% IV SOLN
20.0000 mg | Freq: Once | INTRAVENOUS | Status: DC | PRN
  Filled 2020-04-04: qty 50

## 2020-04-04 NOTE — Progress Notes (Signed)
Patient c/o needing albuterol nebulizer treatment for asthma. Pt at this time is not in any distress, no SOB, O2 sats 99% on RA. Explained to patient due to her covid status we are unable to administer nebulizer treatments but can offer an albuterol inhaler. Pt refuses this tx and states she can only take the nebulizer because the inhaler "burns my throat", RN again informs patient of why we cannot administer the nebulizer treatment at this time but will make the provider aware of her concern and will see if there is any other alternatives. Montez Morita PA-C made aware and gave verbal order for xopenex PRN. Pt also is refusing the monoclonal antibodies and same provider made aware of this as well. Will offer xopenex inhaler and continue to monitor.
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
## 2020-04-04 NOTE — Progress Notes (Signed)
°   04/04/20 1541  Clinical Encounter Type  Visited With Patient (Spoke over phone)  Visit Type Initial  Referral From Nurse  Consult/Referral To Chaplain  Chaplain responded to page. Nurse, Denny Peon suggested the patient speak with the Chaplain. The patient was too upset to have a visit. On the phone, the patient was heard blaming nurses for not supporting her. Nurses tried to calm her patient for her to speak with Chaplain. The patient suggests speaking with the Chaplain later. This note was prepared by Deneen Harts, M.Div..  For questions please contact by phone [phone].

## 2020-04-05 ENCOUNTER — Encounter (HOSPITAL_COMMUNITY): Payer: Self-pay | Admitting: Orthopaedic Surgery

## 2020-04-05 ENCOUNTER — Other Ambulatory Visit (HOSPITAL_COMMUNITY): Payer: Self-pay | Admitting: Orthopedic Surgery

## 2020-04-05 DIAGNOSIS — F32A Depression, unspecified: Secondary | ICD-10-CM | POA: Diagnosis present

## 2020-04-05 DIAGNOSIS — F419 Anxiety disorder, unspecified: Secondary | ICD-10-CM | POA: Diagnosis present

## 2020-04-05 DIAGNOSIS — U071 COVID-19: Secondary | ICD-10-CM

## 2020-04-05 DIAGNOSIS — E559 Vitamin D deficiency, unspecified: Secondary | ICD-10-CM | POA: Diagnosis present

## 2020-04-05 DIAGNOSIS — F17200 Nicotine dependence, unspecified, uncomplicated: Secondary | ICD-10-CM | POA: Diagnosis present

## 2020-04-05 HISTORY — DX: COVID-19: U07.1

## 2020-04-05 HISTORY — DX: Vitamin D deficiency, unspecified: E55.9

## 2020-04-05 LAB — CBC
HCT: 26.8 % — ABNORMAL LOW (ref 36.0–46.0)
Hemoglobin: 8.2 g/dL — ABNORMAL LOW (ref 12.0–15.0)
MCH: 27.2 pg (ref 26.0–34.0)
MCHC: 30.6 g/dL (ref 30.0–36.0)
MCV: 89 fL (ref 80.0–100.0)
Platelets: 199 10*3/uL (ref 150–400)
RBC: 3.01 MIL/uL — ABNORMAL LOW (ref 3.87–5.11)
RDW: 17.2 % — ABNORMAL HIGH (ref 11.5–15.5)
WBC: 14.4 10*3/uL — ABNORMAL HIGH (ref 4.0–10.5)
nRBC: 0 % (ref 0.0–0.2)

## 2020-04-05 LAB — PREALBUMIN: Prealbumin: 16.2 mg/dL — ABNORMAL LOW (ref 18–38)

## 2020-04-05 MED ORDER — ZINC SULFATE 220 (50 ZN) MG PO CAPS: 220.0000 mg | ORAL_CAPSULE | Freq: Every day | ORAL | 0 refills | Status: AC

## 2020-04-05 MED ORDER — RIVAROXABAN 15 MG PO TABS: 15.0000 mg | ORAL_TABLET | Freq: Every day | ORAL | 0 refills | Status: DC

## 2020-04-05 MED ORDER — VITAMIN D (ERGOCALCIFEROL) 1.25 MG (50000 UNIT) PO CAPS: 50000.0000 [IU] | ORAL_CAPSULE | ORAL | 2 refills | Status: DC

## 2020-04-05 MED ORDER — DOCUSATE SODIUM 100 MG PO CAPS: 100.0000 mg | ORAL_CAPSULE | Freq: Two times a day (BID) | ORAL | 0 refills | Status: DC

## 2020-04-05 MED ORDER — ACETAMINOPHEN 500 MG PO TABS: 1000.0000 mg | ORAL_TABLET | Freq: Four times a day (QID) | ORAL | 1 refills | Status: DC

## 2020-04-05 MED ORDER — HYDROMORPHONE HCL 2 MG PO TABS: 2.0000 mg | ORAL_TABLET | Freq: Four times a day (QID) | ORAL | 0 refills | Status: DC | PRN

## 2020-04-05 MED ORDER — ADULT MULTIVITAMIN W/MINERALS CH: 1.0000 | ORAL_TABLET | Freq: Every day | ORAL | 1 refills | Status: AC

## 2020-04-05 MED ORDER — ASCORBIC ACID 1000 MG PO TABS: 1000.0000 mg | ORAL_TABLET | Freq: Every day | ORAL | 1 refills | Status: AC

## 2020-04-05 MED ORDER — VITAMIN D 125 MCG (5000 UT) PO CAPS: 1.0000 | ORAL_CAPSULE | Freq: Every day | ORAL | 6 refills | Status: DC

## 2020-04-05 MED ORDER — HYDROXYZINE HCL 25 MG PO TABS: 25.0000 mg | ORAL_TABLET | Freq: Three times a day (TID) | ORAL | 0 refills | Status: DC | PRN

## 2020-04-05 MED ORDER — METHOCARBAMOL 500 MG PO TABS: 500.0000 mg | ORAL_TABLET | Freq: Four times a day (QID) | ORAL | 1 refills | Status: DC | PRN

## 2020-04-05 MED ORDER — RIVAROXABAN 15 MG PO TABS
15.0000 mg | ORAL_TABLET | Freq: Every day | ORAL | Status: DC
  Administered 2020-04-05 – 2020-04-07 (×3): 15 mg via ORAL
  Filled 2020-04-05 (×4): qty 1

## 2020-04-05 MED ORDER — GABAPENTIN 300 MG PO CAPS: 300.0000 mg | ORAL_CAPSULE | Freq: Three times a day (TID) | ORAL | 0 refills | Status: DC

## 2020-04-05 MED FILL — METHOCARBAMOL 500 MG TABS: 500 | 8 days supply | Qty: 60 | Fill #0

## 2020-04-05 MED FILL — GABAPENTIN 300 MG CAPSULE: 300 | 10 days supply | Qty: 90 | Fill #0

## 2020-04-05 MED FILL — VIT D2 1.25 MG (50,000 UNIT: 1.25 MG | 35 days supply | Qty: 5 | Fill #0

## 2020-04-05 MED FILL — XARELTO 15 MG TABLET: 15 | 30 days supply | Qty: 30 | Fill #0

## 2020-04-05 MED FILL — hydrOXYzine HCL 25 MG TABS: 25 | 10 days supply | Qty: 30 | Fill #0

## 2020-04-05 MED FILL — HYDROmorphone HCL 2 MG TABS: 2 | 5 days supply | Qty: 40 | Fill #0
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
## 2020-04-05 NOTE — Care Plan (Signed)
Received a message that patient's mother wanted an update on patient's status.  When I asked patient if she was ok with RN giving the update, the patient said to not give any information to her biological mother.  The mother's phone number is [phone]. Tobi Bastos Engineer, manufacturing systems) notified.  Mother was not called or given any information about the patient.
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
## 2020-04-05 NOTE — Progress Notes (Signed)
Pt pulled PIV several times out - Pt is currently without IV access, MD paged and notified.

## 2020-04-05 NOTE — Discharge Instructions (Signed)
[facility] Discharge Instructions   General Discharge Instructions  Orthopaedic Injuries:  Open right distal tibia and fibula fracture treated with irrigation debridement and open reduction internal fixation using plate and screws and cement spacer.  Right tibial plateau fracture treated with open reduction internal fixation using plate and screws  WEIGHT BEARING STATUS: Nonweightbearing right leg.  Use walker or crutches to ambulate.  Do not put any weight on right leg at all  RANGE OF MOTION/ACTIVITY: Unrestricted range of motion of right knee and hip.  You cannot move your ankle right now as you are in a splint.  Activity as tolerated while maintaining weightbearing restrictions.  No driving.  Bone health: Labs show vitamin D deficiency.  You have been prescribed 2 different types of vitamin D.  Please take both of them as well as your vitamin C and zinc  Wound Care: Do not remove splint on right ankle.  Okay to change dressing right thigh starting 04/07/2020.  Can leave open to the air if completely dry otherwise cover with gauze and tape  Do not get splint wet.  Keep it dry.  Can clean wound on right thigh with soap and water only   DVT/PE prophylaxis: Xarelto 15 mg tablet daily x 4 weeks  Diet: as you were eating previously.  Can use over the counter stool softeners and bowel preparations, such as Miralax, to help with bowel movements.  Narcotics can be constipating.  Be sure to drink plenty of fluids  PAIN MEDICATION USE AND EXPECTATIONS  You have likely been given narcotic medications to help control your pain.  After a traumatic event that results in an fracture (broken bone) with or without surgery, it is ok to use narcotic pain medications to help control one's pain.  We understand that everyone responds to pain differently and each individual patient will be evaluated on a regular basis for the continued need for narcotic medications. Ideally, narcotic  medication use should last no more than 6-8 weeks (coinciding with fracture healing).   As a patient it is your responsibility as well to monitor narcotic medication use and report the amount and frequency you use these medications when you come to your office visit.   We would also advise that if you are using narcotic medications, you should take a dose prior to therapy to maximize you participation.  IF YOU ARE ON NARCOTIC MEDICATIONS IT IS NOT PERMISSIBLE TO OPERATE A MOTOR VEHICLE (MOTORCYCLE/CAR/TRUCK/MOPED) OR HEAVY MACHINERY DO NOT MIX NARCOTICS WITH OTHER CNS (CENTRAL NERVOUS SYSTEM) DEPRESSANTS SUCH AS ALCOHOL   STOP SMOKING OR USING NICOTINE PRODUCTS!!!!  As discussed nicotine severely impairs your body's ability to heal surgical and traumatic wounds but also impairs bone healing.  Wounds and bone heal by forming microscopic blood vessels (angiogenesis) and nicotine is a vasoconstrictor (essentially, shrinks blood vessels).  Therefore, if vasoconstriction occurs to these microscopic blood vessels they essentially disappear and are unable to deliver necessary nutrients to the healing tissue.  This is one modifiable factor that you can do to dramatically increase your chances of healing your injury.    (This means no smoking, no nicotine gum, patches, etc)  DO NOT USE NONSTEROIDAL ANTI-INFLAMMATORY DRUGS (NSAID'S)  Using products such as Advil (ibuprofen), Aleve (naproxen), Motrin (ibuprofen) for additional pain control during fracture healing can delay and/or prevent the healing response.  If you would like to take over the counter (OTC) medication, Tylenol (acetaminophen) is ok.  However, some narcotic medications that are given  for pain control contain acetaminophen as well. Therefore, you should not exceed more than 4000 mg of tylenol in a day if you do not have liver disease.  Also note that there are may OTC medicines, such as cold medicines and allergy medicines that my contain tylenol  as well.  If you have any questions about medications and/or interactions please ask your doctor/PA or your pharmacist.      ICE AND ELEVATE INJURED/OPERATIVE EXTREMITY  Using ice and elevating the injured extremity above your heart can help with swelling and pain control.  Icing in a pulsatile fashion, such as 20 minutes on and 20 minutes off, can be followed.    Do not place ice directly on skin. Make sure there is a barrier between to skin and the ice pack.    Using frozen items such as frozen peas works well as the conform nicely to the are that needs to be iced.  USE AN ACE WRAP OR TED HOSE FOR SWELLING CONTROL  In addition to icing and elevation, Ace wraps or TED hose are used to help limit and resolve swelling.  It is recommended to use Ace wraps or TED hose until you are informed to stop.    When using Ace Wraps start the wrapping distally (farthest away from the body) and wrap proximally (closer to the body)   Example: If you had surgery on your leg or thing and you do not have a splint on, start the ace wrap at the toes and work your way up to the thigh        If you had surgery on your upper extremity and do not have a splint on, start the ace wrap at your fingers and work your way up to the upper arm  IF YOU ARE IN A SPLINT OR CAST DO NOT REMOVE IT FOR ANY REASON   If your splint gets wet for any reason please contact the office immediately. You may shower in your splint or cast as long as you keep it dry.  This can be done by wrapping in a cast cover or garbage back (or similar)  Do Not stick any thing down your splint or cast such as pencils, money, or hangers to try and scratch yourself with.  If you feel itchy take benadryl as prescribed on the bottle for itching  IF YOU ARE IN A CAM BOOT (BLACK BOOT)  You may remove boot periodically. Perform daily dressing changes as noted below.  Wash the liner of the boot regularly and wear a sock when wearing the boot. It is recommended that  you sleep in the boot until told otherwise    Call office for the following:  Temperature greater than 101F  Persistent nausea and vomiting  Severe uncontrolled pain  Redness, tenderness, or signs of infection (pain, swelling, redness, odor or green/yellow discharge around the site)  Difficulty breathing, headache or visual disturbances  Hives  Persistent dizziness or light-headedness  Extreme fatigue  Any other questions or concerns you may have after discharge  In an emergency, call 911 or go to an Emergency Department at a nearby hospital  HELPFUL INFORMATION  ? If you had a block, it will wear off between 8-24 hrs postop typically.  This is period when your pain may go from nearly zero to the pain you would have had postop without the block.  This is an abrupt transition but nothing dangerous is happening.  You may take an extra dose of  narcotic when this happens.  ? You should wean off your narcotic medicines as soon as you are able.  Most patients will be off or using minimal narcotics before their first postop appointment.   ? We suggest you use the pain medication the first night prior to going to bed, in order to ease any pain when the anesthesia wears off. You should avoid taking pain medications on an empty stomach as it will make you nauseous.  ? Do not drink alcoholic beverages or take illicit drugs when taking pain medications.  ? In most states it is against the law to drive while you are in a splint or sling.  And certainly against the law to drive while taking narcotics.  ? You may return to work/school in the next couple of days when you feel up to it.   ? Pain medication may make you constipated.  Below are a few solutions to try in this order: - Decrease the amount of pain medication if you aren't having pain. - Drink lots of decaffeinated fluids. - Drink prune juice and/or each dried prunes  o If the first 3 don't work start with additional  solutions - Take Colace - an over-the-counter stool softener - Take Senokot - an over-the-counter laxative - Take Miralax - a stronger over-the-counter laxative     CALL THE OFFICE WITH ANY QUESTIONS OR CONCERNS: [phone]   VISIT OUR WEBSITE FOR ADDITIONAL INFORMATION: [email or web address]    Prevent the Spread of COVID-19 if You Are Sick If you are sick with COVID-19 or think you might have COVID-19, follow the steps below to care for yourself and to help protect other people in your home and community. Stay home except to get medical care.  Stay home. Most people with COVID-19 have mild illness and are able to recover at home without medical care. Do not leave your home, except to get medical care. Do not visit public areas.  Take care of yourself. Get rest and stay hydrated. Take over-the-counter medicines, such as acetaminophen, to help you feel better.  Stay in touch with your doctor. Call before you get medical care. Be sure to get care if you have trouble breathing, or have any other emergency warning signs, or if you think it is an emergency.  Avoid public transportation, ride-sharing, or taxis. Separate yourself from other people and pets in your home.  As much as possible, stay in a specific room and away from other people and pets in your home. Also, you should use a separate bathroom, if available. If you need to be around other people or animals in or outside of the home, wear a mask. ? See COVID-19 and Animals if you have questions about [email or web address]. ? Additional guidance is available for those living in close quarters. (http://white.info/.html) and shared housing (DisasterTalk.co.uk). Monitor your symptoms.  Symptoms of COVID-19 include fever, cough, and shortness of breath but other  symptoms may be present as well.  Follow care instructions from your healthcare provider and local health department. Your local health authorities will give instructions on checking your symptoms and reporting information. When to Seek Emergency Medical Attention Look for emergency warning signs* for COVID-19. If someone is showing any of these signs, seek emergency medical care immediately:  Trouble breathing  Persistent pain or pressure in the chest  New confusion  Bluish lips or face  Inability to wake or stay awake *This list is not all possible symptoms. Please call your medical provider for any other  symptoms that are severe or concerning to you. Call 911 or call ahead to your local emergency facility: Notify the operator that you are seeking care for someone who has or may have COVID-19. Call ahead before visiting your doctor.  Call ahead. Many medical visits for routine care are being postponed or done by phone or telemedicine.  If you have a medical appointment that cannot be postponed, call your doctor's office, and tell them you have or may have COVID-19. If you are sick, wear a mask over your nose and mouth.  You should wear a mask over your nose and mouth if you must be around other people or animals, including pets (even at home).  You don't need to wear the mask if you are alone. If you can't put on a mask (because of trouble breathing for example), cover your coughs and sneezes in some other way. Try to stay at least 6 feet away from other people. This will help protect the people around you.  Masks should not be placed on young children under age 85 years, anyone who has trouble breathing, or anyone who is not able to remove the mask without help. Note: During the COVID-19 pandemic, medical grade facemasks are reserved for healthcare workers and some first responders. You may need to make a mask using a scarf or bandana. Cover your coughs and sneezes.  Cover your  mouth and nose with a tissue when you cough or sneeze.  Throw used tissues in a lined trash can.  Immediately wash your hands with soap and water for at least 20 seconds. If soap and water are not available, clean your hands with an alcohol-based hand sanitizer that contains at least 60% alcohol. Clean your hands often.  Wash your hands often with soap and water for at least 20 seconds. This is especially important after blowing your nose, coughing, or sneezing; going to the bathroom; and before eating or preparing food.  Use hand sanitizer if soap and water are not available. Use an alcohol-based hand sanitizer with at least 60% alcohol, covering all surfaces of your hands and rubbing them together until they feel dry.  Soap and water are the best option, especially if your hands are visibly dirty.  Avoid touching your eyes, nose, and mouth with unwashed hands. Avoid sharing personal household items.  Do not share dishes, drinking glasses, cups, eating utensils, towels, or bedding with other people in your home.  Wash these items thoroughly after using them with soap and water or put them in the dishwasher. Clean all "high-touch" surfaces everyday.  Clean and disinfect high-touch surfaces in your "sick room" and bathroom. Let someone else clean and disinfect surfaces in common areas, but not your bedroom and bathroom.  If a caregiver or other person needs to clean and disinfect a sick person's bedroom or bathroom, they should do so on an as-needed basis. The caregiver/other person should wear a mask and wait as long as possible after the sick person has used the bathroom. High-touch surfaces include phones, remote controls, counters, tabletops, doorknobs, bathroom fixtures, toilets, keyboards, tablets, and bedside tables.  Clean and disinfect areas that may have blood, stool, or body fluids on them.  Use household cleaners and disinfectants. Clean the area or item with soap and water or  another detergent if it is dirty. Then use a household disinfectant. ? Be sure to follow the instructions on the label to ensure safe and effective use of the product. Many products recommend keeping  the surface wet for several minutes to ensure germs are killed. Many also recommend precautions such as wearing gloves and making sure you have good ventilation during use of the product. ? Most EPA-registered household disinfectants should be effective. When you can be around others after you had or likely had COVID-19 When you can be around others (end home isolation) depends on different factors for different situations.  I think or know I had COVID-19, and I had symptoms ? You can be with others after  24 hours with no fever AND  Symptoms improved AND  10 days since symptoms first appeared ? Depending on your healthcare provider's advice and availability of testing, you might get tested to see if you still have COVID-19. If you will be tested, you can be around others when you have no fever, symptoms have improved, and you receive two negative test results in a row, at least 24 hours apart.  I tested positive for COVID-19 but had no symptoms ? If you continue to have no symptoms, you can be with others after:  10 days have passed since test ? Depending on your healthcare provider's advice and availability of testing, you might get tested to see if you still have COVID-19. If you will be tested, you can be around others after you receive two negative test results in a row, at least 24 hours apart. ? If you develop symptoms after testing positive, follow the guidance above for "I think or know I had COVID, and I had symptoms." [email or web address] 11/30/2018 This information is not intended to replace advice given to you by your health care provider. Make sure you discuss any questions you have with your health care provider. Document Revised: 12/16/2018 Document Reviewed: 10/19/2018 Elsevier  Patient Education  2020 [employer].  Information on my medicine - XARELTO (Rivaroxaban)  This medication education was reviewed with me or my healthcare representative as part of my discharge preparation.  Why was Xarelto prescribed for you? Xarelto was prescribed for you to reduce the risk of blood clots forming after orthopedic surgery. The medical term for these abnormal blood clots is venous thromboembolism (VTE).  What do you need to know about xarelto ? Take your Xarelto ONCE DAILY at the same time every day. You may take it either with or without food.  If you have difficulty swallowing the tablet whole, you may crush it and mix in applesauce just prior to taking your dose.  Take Xarelto exactly as prescribed by your doctor and DO NOT stop taking Xarelto without talking to the doctor who prescribed the medication.  Stopping without other VTE prevention medication to take the place of Xarelto may increase your risk of developing a clot.  After discharge, you should have regular check-up appointments with your healthcare provider that is prescribing your Xarelto.    What do you do if you miss a dose? If you miss a dose, take it as soon as you remember on the same day then continue your regularly scheduled once daily regimen the next day. Do not take two doses of Xarelto on the same day.   Important Safety Information A possible side effect of Xarelto is bleeding. You should call your healthcare provider right away if you experience any of the following: ? Bleeding from an injury or your nose that does not stop. ? Unusual colored urine (red or dark brown) or unusual colored stools (red or black). ? Unusual bruising for unknown reasons. ? A serious fall or  if you hit your head (even if there is no bleeding).  Some medicines may interact with Xarelto and might increase your risk of bleeding while on Xarelto. To help avoid this, consult your healthcare provider or  pharmacist prior to using any new prescription or non-prescription medications, including herbals, vitamins, non-steroidal anti-inflammatory drugs (NSAIDs) and supplements.  This website has more information on Xarelto: [email or web address].
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
## 2020-04-05 NOTE — Care Management (Signed)
Patient has a tibial plateau fracture as well as an  Right distal tibia/fibula fracture  That impairs their ability to perform ADL's such as bathing dressing,  And grooming. A walker cane, or crutch will not  solve the problem. The wheelchair is needed  To stabilize the patient, prevent  Risk of injury.from falling.

## 2020-04-05 NOTE — TOC Transition Note (Addendum)
Transition of Care [facility]) - CM/SW Discharge Note   Patient Details  Name: Ashley Simon MRN: [id number] Date of Birth: [birth year]  [facility] of [facility]) CM/SW Contact:  Lockie Pares, RN Phone Number: 04/05/2020, 3:27 PM   Clinical Narrative:    Attempted to call patient by phone to make sure that she would accept wheelchair, walker , and 3:1. Her RN went in and she agreed. To DME. Ordered walker, wheelchair and 3:1 from [employer]. They will call her about any co-pays etc.   [address] [employer] tried to make contact with patient several times, phone busy, Securechatted with nurse to have patient call DME asap  For questions concerning DME provisions.      Barriers to Discharge: Continued Medical Work up   Patient Goals and CMS Choice        Discharge Placement                       Discharge Plan and Services In-house Referral: Clinical Social Work Discharge Planning Services: CM Consult            DME Arranged: 3-N-1,Wheelchair manual,Walker DME Agency: [employer] Date DME Agency Contacted: 04/05/20 Time DME Agency Contacted: 1527 Representative spoke with at DME Agency: Velna Hatchet            Social Determinants of Health (SDOH) Interventions     Readmission Risk Interventions No flowsheet data found.

## 2020-04-05 NOTE — Progress Notes (Signed)
Orthopedic Tech Progress Note Patient Details:  Ashley Simon 13-Sep-1985 [id number] Gave to secretary to give to nurse. Ortho Devices Type of Ortho Device: Bone foam zero knee Ortho Device/Splint Location: RLE Ortho Device/Splint Interventions: Ordered   Post Interventions Patient Tolerated: Well Instructions Provided: Care of device,Adjustment of device   [name] [name] 04/05/2020, 11:04 AM
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
## 2020-04-06 NOTE — TOC Transition Note (Signed)
Transition of Care [facility]) - CM/SW Discharge Note   Patient Details  Name: FRAIDY MCCARRICK MRN: [id number] Date of Birth: 1985-08-14  Transition of Care [facility]) CM/SW Contact:  Lockie Pares, RN Phone Number: 04/06/2020, 11:32 AM   Clinical Narrative:    Patient being discharged today, wheelchair and [facility] ordered yesterday. Via [employer]. Confirmed that the patient was spoken to about DME. And is accepting.      Barriers to Discharge: Continued Medical Work up   Patient Goals and CMS Choice        Discharge Placement                       Discharge Plan and Services In-house Referral: Clinical Social Work Discharge Planning Services: CM Consult            DME Arranged: 3-N-1,Wheelchair manual,Walker DME Agency: [employer] Date DME Agency Contacted: 04/05/20 Time DME Agency Contacted: 1527 Representative spoke with at DME Agency: Velna Hatchet            Social Determinants of Health (SDOH) Interventions     Readmission Risk Interventions No flowsheet data found.

## 2020-04-09 ENCOUNTER — Telehealth: Payer: Self-pay

## 2020-04-09 DIAGNOSIS — S80849A External constriction, unspecified lower leg, initial encounter: Secondary | ICD-10-CM | POA: Diagnosis not present

## 2020-04-09 DIAGNOSIS — S299XXA Unspecified injury of thorax, initial encounter: Secondary | ICD-10-CM | POA: Diagnosis not present

## 2020-04-09 DIAGNOSIS — Z4889 Encounter for other specified surgical aftercare: Secondary | ICD-10-CM | POA: Diagnosis not present

## 2020-04-09 DIAGNOSIS — S29091A Other injury of muscle and tendon of front wall of thorax, initial encounter: Secondary | ICD-10-CM | POA: Diagnosis not present

## 2020-04-09 DIAGNOSIS — R109 Unspecified abdominal pain: Secondary | ICD-10-CM | POA: Diagnosis not present

## 2020-04-09 DIAGNOSIS — N200 Calculus of kidney: Secondary | ICD-10-CM | POA: Diagnosis not present

## 2020-04-09 DIAGNOSIS — N8312 Corpus luteum cyst of left ovary: Secondary | ICD-10-CM | POA: Diagnosis not present

## 2020-04-09 DIAGNOSIS — Z4789 Encounter for other orthopedic aftercare: Secondary | ICD-10-CM | POA: Diagnosis not present

## 2020-04-09 DIAGNOSIS — Z4802 Encounter for removal of sutures: Secondary | ICD-10-CM | POA: Diagnosis not present

## 2020-04-09 DIAGNOSIS — Z5189 Encounter for other specified aftercare: Secondary | ICD-10-CM | POA: Diagnosis not present

## 2020-04-09 NOTE — Telephone Encounter (Signed)
Transition Care Management Unsuccessful Follow-up Telephone Call  Date of discharge and from where:  04/05/2020 from [facility]  Attempts:  1st Attempt  Reason for unsuccessful TCM follow-up call:  Unable to leave message  Patient answered but stated that she was sleeping. Informed that I would give her a call back tomorrow.

## 2020-04-10 DIAGNOSIS — R109 Unspecified abdominal pain: Secondary | ICD-10-CM | POA: Diagnosis not present

## 2020-04-10 DIAGNOSIS — S299XXA Unspecified injury of thorax, initial encounter: Secondary | ICD-10-CM | POA: Diagnosis not present
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
## 2020-04-11 DIAGNOSIS — G8918 Other acute postprocedural pain: Secondary | ICD-10-CM | POA: Diagnosis not present

## 2020-04-11 DIAGNOSIS — M79604 Pain in right leg: Secondary | ICD-10-CM | POA: Diagnosis not present
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
## 2020-04-25 DIAGNOSIS — S82871F Displaced pilon fracture of right tibia, subsequent encounter for open fracture type IIIA, IIIB, or IIIC with routine healing: Secondary | ICD-10-CM | POA: Diagnosis not present

## 2020-04-25 DIAGNOSIS — S82141D Displaced bicondylar fracture of right tibia, subsequent encounter for closed fracture with routine healing: Secondary | ICD-10-CM | POA: Diagnosis not present

## 2020-04-25 DIAGNOSIS — S82111D Displaced fracture of right tibial spine, subsequent encounter for closed fracture with routine healing: Secondary | ICD-10-CM | POA: Diagnosis not present

## 2020-05-07 ENCOUNTER — Telehealth (HOSPITAL_COMMUNITY): Payer: Self-pay
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
## 2020-05-28 DIAGNOSIS — S82871F Displaced pilon fracture of right tibia, subsequent encounter for open fracture type IIIA, IIIB, or IIIC with routine healing: Secondary | ICD-10-CM | POA: Diagnosis not present

## 2020-05-28 DIAGNOSIS — S82871A Displaced pilon fracture of right tibia, initial encounter for closed fracture: Secondary | ICD-10-CM | POA: Diagnosis not present

## 2020-05-28 DIAGNOSIS — S82141D Displaced bicondylar fracture of right tibia, subsequent encounter for closed fracture with routine healing: Secondary | ICD-10-CM | POA: Diagnosis not present

## 2020-05-28 DIAGNOSIS — S82111D Displaced fracture of right tibial spine, subsequent encounter for closed fracture with routine healing: Secondary | ICD-10-CM | POA: Diagnosis not present

## 2020-06-06 ENCOUNTER — Encounter (HOSPITAL_COMMUNITY): Payer: Self-pay | Admitting: Orthopedic Surgery

## 2020-06-07 ENCOUNTER — Encounter (HOSPITAL_COMMUNITY)
Admission: RE | Disposition: A | Discharge status: Home / Self Care | Payer: Self-pay | Source: Ambulatory Visit | Attending: Orthopedic Surgery

## 2020-06-07 ENCOUNTER — Ambulatory Visit (HOSPITAL_COMMUNITY): Payer: Medicaid Other

## 2020-06-07 ENCOUNTER — Encounter (HOSPITAL_COMMUNITY): Payer: Self-pay | Admitting: Orthopedic Surgery

## 2020-06-07 ENCOUNTER — Other Ambulatory Visit: Payer: Self-pay

## 2020-06-07 ENCOUNTER — Observation Stay (HOSPITAL_COMMUNITY)
Admission: RE | Admit: 2020-06-07 | Discharge: 2020-06-11 | Disposition: A | Discharge status: Home / Self Care | Payer: Medicaid Other | Source: Ambulatory Visit | Attending: Orthopedic Surgery | Admitting: Orthopedic Surgery

## 2020-06-07 ENCOUNTER — Ambulatory Visit (HOSPITAL_COMMUNITY): Payer: Medicaid Other | Admitting: Anesthesiology

## 2020-06-07 DIAGNOSIS — Z419 Encounter for procedure for purposes other than remedying health state, unspecified: Secondary | ICD-10-CM

## 2020-06-07 DIAGNOSIS — F32A Depression, unspecified: Secondary | ICD-10-CM | POA: Diagnosis not present

## 2020-06-07 DIAGNOSIS — Z79899 Other long term (current) drug therapy: Secondary | ICD-10-CM | POA: Diagnosis not present

## 2020-06-07 DIAGNOSIS — J45909 Unspecified asthma, uncomplicated: Secondary | ICD-10-CM | POA: Insufficient documentation

## 2020-06-07 DIAGNOSIS — F419 Anxiety disorder, unspecified: Secondary | ICD-10-CM | POA: Diagnosis not present

## 2020-06-07 DIAGNOSIS — S82871N Displaced pilon fracture of right tibia, subsequent encounter for open fracture type IIIA, IIIB, or IIIC with nonunion: Principal | ICD-10-CM | POA: Insufficient documentation

## 2020-06-07 DIAGNOSIS — E559 Vitamin D deficiency, unspecified: Secondary | ICD-10-CM | POA: Diagnosis not present

## 2020-06-07 DIAGNOSIS — F418 Other specified anxiety disorders: Secondary | ICD-10-CM | POA: Diagnosis not present

## 2020-06-07 DIAGNOSIS — S82201D Unspecified fracture of shaft of right tibia, subsequent encounter for closed fracture with routine healing: Secondary | ICD-10-CM | POA: Diagnosis not present

## 2020-06-07 DIAGNOSIS — Z8616 Personal history of COVID-19: Secondary | ICD-10-CM | POA: Diagnosis not present

## 2020-06-07 DIAGNOSIS — Z981 Arthrodesis status: Secondary | ICD-10-CM | POA: Diagnosis not present

## 2020-06-07 DIAGNOSIS — F17210 Nicotine dependence, cigarettes, uncomplicated: Secondary | ICD-10-CM | POA: Insufficient documentation

## 2020-06-07 DIAGNOSIS — S82391M Other fracture of lower end of right tibia, subsequent encounter for open fracture type I or II with nonunion: Secondary | ICD-10-CM

## 2020-06-07 DIAGNOSIS — S82871K Displaced pilon fracture of right tibia, subsequent encounter for closed fracture with nonunion: Secondary | ICD-10-CM | POA: Diagnosis not present

## 2020-06-07 HISTORY — PX: HARVEST BONE GRAFT: SHX377

## 2020-06-07 LAB — COMPREHENSIVE METABOLIC PANEL
ALT: 28 U/L (ref 0–44)
AST: 23 U/L (ref 15–41)
Albumin: 3.7 g/dL (ref 3.5–5.0)
Alkaline Phosphatase: 88 U/L (ref 38–126)
Anion gap: 9 (ref 5–15)
BUN: 6 mg/dL (ref 6–20)
CO2: 23 mmol/L (ref 22–32)
Calcium: 9.3 mg/dL (ref 8.9–10.3)
Chloride: 106 mmol/L (ref 98–111)
Creatinine, Ser: 0.7 mg/dL (ref 0.44–1.00)
GFR, Estimated: >60 mL/min (ref >60)
Glucose, Bld: 95 mg/dL (ref 70–99)
Potassium: 3.6 mmol/L (ref 3.5–5.1)
Sodium: 138 mmol/L (ref 135–145)
Total Bilirubin: 0.5 mg/dL (ref 0.3–1.2)
Total Protein: 6.9 g/dL (ref 6.5–8.1)

## 2020-06-07 LAB — VITAMIN D 25 HYDROXY (VIT D DEFICIENCY, FRACTURES): Vit D, 25-Hydroxy: 37.62 ng/mL (ref 30–100)

## 2020-06-07 LAB — CBC WITH DIFFERENTIAL/PLATELET
Abs Immature Granulocytes: 0.02 10*3/uL (ref 0.00–0.07)
Basophils Absolute: 0.1 10*3/uL (ref 0.0–0.1)
Basophils Relative: 1 %
Eosinophils Absolute: 0.3 10*3/uL (ref 0.0–0.5)
Eosinophils Relative: 4 %
HCT: 34.6 % — ABNORMAL LOW (ref 36.0–46.0)
Hemoglobin: 10 g/dL — ABNORMAL LOW (ref 12.0–15.0)
Immature Granulocytes: 0 %
Lymphocytes Relative: 30 %
Lymphs Abs: 2.1 10*3/uL (ref 0.7–4.0)
MCH: 24 pg — ABNORMAL LOW (ref 26.0–34.0)
MCHC: 28.9 g/dL — ABNORMAL LOW (ref 30.0–36.0)
MCV: 83 fL (ref 80.0–100.0)
Monocytes Absolute: 0.8 10*3/uL (ref 0.1–1.0)
Monocytes Relative: 12 %
Neutro Abs: 3.7 10*3/uL (ref 1.7–7.7)
Neutrophils Relative %: 53 %
Platelets: 288 10*3/uL (ref 150–400)
RBC: 4.17 MIL/uL (ref 3.87–5.11)
RDW: 15.7 % — ABNORMAL HIGH (ref 11.5–15.5)
WBC: 7 10*3/uL (ref 4.0–10.5)
nRBC: 0 % (ref 0.0–0.2)

## 2020-06-07 LAB — POCT PREGNANCY, URINE: Preg Test, Ur: NEGATIVE

## 2020-06-07 LAB — SURGICAL PCR SCREEN
MRSA, PCR: NEGATIVE
Staphylococcus aureus: NEGATIVE

## 2020-06-07 LAB — PROTIME-INR
INR: 0.9 (ref 0.8–1.2)
Prothrombin Time: 12.1 seconds (ref 11.4–15.2)

## 2020-06-07 LAB — APTT: aPTT: 30 seconds (ref 24–36)

## 2020-06-07 SURGERY — PROCEDURE, BONE GRAFT
Anesthesia: General | Site: Leg Lower | Laterality: Right

## 2020-06-07 MED ORDER — METHOCARBAMOL 500 MG PO TABS
500.0000 mg | ORAL_TABLET | Freq: Four times a day (QID) | ORAL | Status: DC | PRN
  Administered 2020-06-08 (×2): 1000 mg via ORAL
  Administered 2020-06-11: 500 mg via ORAL
  Filled 2020-06-07: qty 2
  Filled 2020-06-07: qty 1
  Filled 2020-06-07 (×4): qty 2

## 2020-06-07 MED ORDER — HYDROMORPHONE HCL 1 MG/ML IJ SOLN
INTRAMUSCULAR | Status: AC
  Filled 2020-06-07: qty 1

## 2020-06-07 MED ORDER — MIDAZOLAM HCL 2 MG/2ML IJ SOLN: 1.0000 mg | Freq: Once | INTRAMUSCULAR | Status: DC

## 2020-06-07 MED ORDER — DOCUSATE SODIUM 100 MG PO CAPS
100.0000 mg | ORAL_CAPSULE | Freq: Two times a day (BID) | ORAL | Status: DC
  Administered 2020-06-07 – 2020-06-11 (×8): 100 mg via ORAL
  Filled 2020-06-07 (×8): qty 1

## 2020-06-07 MED ORDER — ROCURONIUM BROMIDE 10 MG/ML (PF) SYRINGE
PREFILLED_SYRINGE | INTRAVENOUS | Status: AC
  Filled 2020-06-07: qty 10

## 2020-06-07 MED ORDER — HYDROMORPHONE HCL 1 MG/ML IJ SOLN
0.5000 mg | INTRAMUSCULAR | Status: DC | PRN
  Administered 2020-06-07 – 2020-06-09 (×5): 1 mg via INTRAVENOUS
  Administered 2020-06-09: 0.5 mg via INTRAVENOUS
  Administered 2020-06-09 – 2020-06-10 (×2): 1 mg via INTRAVENOUS
  Administered 2020-06-10: 0.5 mg via INTRAVENOUS
  Administered 2020-06-10 – 2020-06-11 (×4): 1 mg via INTRAVENOUS
  Filled 2020-06-07 (×14): qty 1

## 2020-06-07 MED ORDER — ONDANSETRON HCL 4 MG/2ML IJ SOLN
INTRAMUSCULAR | Status: AC
  Filled 2020-06-07: qty 2

## 2020-06-07 MED ORDER — CEFAZOLIN SODIUM-DEXTROSE 2-4 GM/100ML-% IV SOLN
2.0000 g | INTRAVENOUS | Status: AC
  Administered 2020-06-07: 2 g via INTRAVENOUS
  Filled 2020-06-07: qty 100

## 2020-06-07 MED ORDER — MIDAZOLAM HCL 2 MG/2ML IJ SOLN
INTRAMUSCULAR | Status: AC
  Filled 2020-06-07: qty 2

## 2020-06-07 MED ORDER — METOCLOPRAMIDE HCL 5 MG/ML IJ SOLN
5.0000 mg | Freq: Three times a day (TID) | INTRAMUSCULAR | Status: DC | PRN
  Administered 2020-06-09: 10 mg via INTRAVENOUS
  Filled 2020-06-07: qty 2

## 2020-06-07 MED ORDER — DEXMEDETOMIDINE (PRECEDEX) IN NS 20 MCG/5ML (4 MCG/ML) IV SYRINGE
PREFILLED_SYRINGE | INTRAVENOUS | Status: DC | PRN
  Administered 2020-06-07 (×2): 8 ug via INTRAVENOUS
  Administered 2020-06-07: 4 ug via INTRAVENOUS

## 2020-06-07 MED ORDER — POVIDONE-IODINE 10 % EX SWAB: 2.0000 "application " | Freq: Once | CUTANEOUS | Status: DC

## 2020-06-07 MED ORDER — PROMETHAZINE HCL 25 MG/ML IJ SOLN
6.2500 mg | INTRAMUSCULAR | Status: DC | PRN
Start: 2020-06-07 — End: 2020-06-07

## 2020-06-07 MED ORDER — ONDANSETRON HCL 4 MG PO TABS: 4.0000 mg | ORAL_TABLET | Freq: Four times a day (QID) | ORAL | Status: DC | PRN

## 2020-06-07 MED ORDER — HYDROMORPHONE HCL 1 MG/ML IJ SOLN
0.2500 mg | INTRAMUSCULAR | Status: DC | PRN
Start: 2020-06-07 — End: 2020-06-07
  Administered 2020-06-07 (×4): 0.5 mg via INTRAVENOUS

## 2020-06-07 MED ORDER — ORAL CARE MOUTH RINSE: 15.0000 mL | Freq: Once | OROMUCOSAL | Status: AC

## 2020-06-07 MED ORDER — BUPIVACAINE LIPOSOME 1.3 % IJ SUSP
20.0000 mL | Freq: Once | INTRAMUSCULAR | Status: AC
  Administered 2020-06-07: 9 mL
  Filled 2020-06-07: qty 20

## 2020-06-07 MED ORDER — ACETAMINOPHEN 500 MG PO TABS
1000.0000 mg | ORAL_TABLET | Freq: Once | ORAL | Status: AC
  Administered 2020-06-07: 1000 mg via ORAL
  Filled 2020-06-07: qty 2

## 2020-06-07 MED ORDER — CHLORHEXIDINE GLUCONATE 4 % EX LIQD: 60.0000 mL | Freq: Once | CUTANEOUS | Status: DC

## 2020-06-07 MED ORDER — CEFAZOLIN SODIUM-DEXTROSE 1-4 GM/50ML-% IV SOLN
1.0000 g | Freq: Four times a day (QID) | INTRAVENOUS | Status: AC
  Administered 2020-06-07 – 2020-06-08 (×3): 1 g via INTRAVENOUS
  Filled 2020-06-07 (×3): qty 50

## 2020-06-07 MED ORDER — ONDANSETRON HCL 4 MG/2ML IJ SOLN
INTRAMUSCULAR | Status: DC | PRN
  Administered 2020-06-07: 4 mg via INTRAVENOUS

## 2020-06-07 MED ORDER — ACETAMINOPHEN 500 MG PO TABS: 1000.0000 mg | ORAL_TABLET | Freq: Three times a day (TID) | ORAL | Status: DC | PRN

## 2020-06-07 MED ORDER — PROPOFOL 10 MG/ML IV BOLUS
INTRAVENOUS | Status: AC
  Filled 2020-06-07: qty 20

## 2020-06-07 MED ORDER — KETAMINE HCL 10 MG/ML IJ SOLN
INTRAMUSCULAR | Status: DC | PRN
  Administered 2020-06-07: 30 mg via INTRAVENOUS

## 2020-06-07 MED ORDER — DIPHENHYDRAMINE HCL 50 MG/ML IJ SOLN
25.0000 mg | Freq: Once | INTRAMUSCULAR | Status: AC
  Administered 2020-06-07: 25 mg via INTRAVENOUS

## 2020-06-07 MED ORDER — HYDROMORPHONE HCL 2 MG PO TABS
2.0000 mg | ORAL_TABLET | Freq: Four times a day (QID) | ORAL | Status: DC | PRN
  Administered 2020-06-07: 2 mg via ORAL
  Administered 2020-06-08 (×3): 4 mg via ORAL
  Administered 2020-06-09: 2 mg via ORAL
  Administered 2020-06-09: 4 mg via ORAL
  Administered 2020-06-09: 2 mg via ORAL
  Administered 2020-06-10 (×2): 4 mg via ORAL
  Administered 2020-06-10 (×2): 2 mg via ORAL
  Administered 2020-06-11 (×2): 4 mg via ORAL
  Filled 2020-06-07 (×4): qty 2
  Filled 2020-06-07 (×2): qty 1
  Filled 2020-06-07 (×4): qty 2
  Filled 2020-06-07: qty 1
  Filled 2020-06-07 (×2): qty 2

## 2020-06-07 MED ORDER — SODIUM CHLORIDE 0.9 % IV SOLN: INTRAVENOUS | Status: DC

## 2020-06-07 MED ORDER — GABAPENTIN 300 MG PO CAPS
300.0000 mg | ORAL_CAPSULE | Freq: Three times a day (TID) | ORAL | Status: DC
  Filled 2020-06-07 (×6): qty 1

## 2020-06-07 MED ORDER — MIDAZOLAM HCL 5 MG/5ML IJ SOLN
INTRAMUSCULAR | Status: DC | PRN
  Administered 2020-06-07: 2 mg via INTRAVENOUS

## 2020-06-07 MED ORDER — ASPIRIN 325 MG PO TABS
325.0000 mg | ORAL_TABLET | Freq: Every day | ORAL | Status: DC
  Administered 2020-06-07 – 2020-06-11 (×5): 325 mg via ORAL
  Filled 2020-06-07 (×5): qty 1

## 2020-06-07 MED ORDER — VITAMIN D 25 MCG (1000 UNIT) PO TABS
5000.0000 [IU] | ORAL_TABLET | Freq: Every day | ORAL | Status: DC
  Administered 2020-06-07 – 2020-06-11 (×5): 5000 [IU] via ORAL
  Filled 2020-06-07 (×5): qty 5

## 2020-06-07 MED ORDER — PHENYLEPHRINE 40 MCG/ML (10ML) SYRINGE FOR IV PUSH (FOR BLOOD PRESSURE SUPPORT)
PREFILLED_SYRINGE | INTRAVENOUS | Status: DC | PRN
  Administered 2020-06-07: 160 ug via INTRAVENOUS

## 2020-06-07 MED ORDER — KETAMINE HCL 50 MG/5ML IJ SOSY
PREFILLED_SYRINGE | INTRAMUSCULAR | Status: AC
  Filled 2020-06-07: qty 5

## 2020-06-07 MED ORDER — SODIUM CHLORIDE 0.9% FLUSH
10.0000 mL | Freq: Two times a day (BID) | INTRAVENOUS | Status: DC
  Administered 2020-06-07: 20 mL
  Administered 2020-06-08 – 2020-06-11 (×6): 10 mL

## 2020-06-07 MED ORDER — ONDANSETRON HCL 4 MG/2ML IJ SOLN
4.0000 mg | Freq: Four times a day (QID) | INTRAMUSCULAR | Status: DC | PRN
  Administered 2020-06-08: 4 mg via INTRAVENOUS
  Filled 2020-06-07: qty 2

## 2020-06-07 MED ORDER — DIPHENHYDRAMINE HCL 50 MG/ML IJ SOLN
INTRAMUSCULAR | Status: AC
  Filled 2020-06-07: qty 1

## 2020-06-07 MED ORDER — HYDROXYZINE HCL 25 MG PO TABS
25.0000 mg | ORAL_TABLET | Freq: Three times a day (TID) | ORAL | Status: DC | PRN
  Administered 2020-06-07 – 2020-06-10 (×7): 25 mg via ORAL
  Filled 2020-06-07 (×8): qty 1

## 2020-06-07 MED ORDER — ACETAMINOPHEN 500 MG PO TABS
1000.0000 mg | ORAL_TABLET | Freq: Four times a day (QID) | ORAL | Status: DC
  Administered 2020-06-07: 1000 mg via ORAL
  Filled 2020-06-07: qty 2

## 2020-06-07 MED ORDER — FENTANYL CITRATE (PF) 250 MCG/5ML IJ SOLN
INTRAMUSCULAR | Status: AC
  Filled 2020-06-07: qty 5

## 2020-06-07 MED ORDER — SODIUM CHLORIDE 0.9% FLUSH: 10.0000 mL | INTRAVENOUS | Status: DC | PRN

## 2020-06-07 MED ORDER — LIDOCAINE 2% (20 MG/ML) 5 ML SYRINGE
INTRAMUSCULAR | Status: DC | PRN
  Administered 2020-06-07: 80 mg via INTRAVENOUS
  Administered 2020-06-07: 20 mg via INTRAVENOUS

## 2020-06-07 MED ORDER — HYDROMORPHONE HCL 1 MG/ML IJ SOLN
1.0000 mg | Freq: Once | INTRAMUSCULAR | Status: AC
  Administered 2020-06-07: 1 mg via INTRAVENOUS
  Filled 2020-06-07: qty 1

## 2020-06-07 MED ORDER — LIDOCAINE 2% (20 MG/ML) 5 ML SYRINGE
INTRAMUSCULAR | Status: AC
  Filled 2020-06-07: qty 5

## 2020-06-07 MED ORDER — DEXAMETHASONE SODIUM PHOSPHATE 10 MG/ML IJ SOLN
INTRAMUSCULAR | Status: AC
  Filled 2020-06-07: qty 1

## 2020-06-07 MED ORDER — EPHEDRINE SULFATE-NACL 50-0.9 MG/10ML-% IV SOSY
PREFILLED_SYRINGE | INTRAVENOUS | Status: DC | PRN
  Administered 2020-06-07: 15 mg via INTRAVENOUS

## 2020-06-07 MED ORDER — DEXAMETHASONE SODIUM PHOSPHATE 10 MG/ML IJ SOLN
INTRAMUSCULAR | Status: DC | PRN
  Administered 2020-06-07: 10 mg via INTRAVENOUS

## 2020-06-07 MED ORDER — METOCLOPRAMIDE HCL 5 MG PO TABS: 5.0000 mg | ORAL_TABLET | Freq: Three times a day (TID) | ORAL | Status: DC | PRN

## 2020-06-07 MED ORDER — PROPOFOL 10 MG/ML IV BOLUS
INTRAVENOUS | Status: DC | PRN
  Administered 2020-06-07: 50 mg via INTRAVENOUS
  Administered 2020-06-07: 150 mg via INTRAVENOUS

## 2020-06-07 MED ORDER — ACETAMINOPHEN 325 MG PO TABS: 325.0000 mg | ORAL_TABLET | Freq: Four times a day (QID) | ORAL | Status: DC | PRN

## 2020-06-07 MED ORDER — FENTANYL CITRATE (PF) 100 MCG/2ML IJ SOLN
INTRAMUSCULAR | Status: DC | PRN
  Administered 2020-06-07 (×9): 50 ug via INTRAVENOUS

## 2020-06-07 MED ORDER — POTASSIUM CHLORIDE IN NACL 20-0.9 MEQ/L-% IV SOLN
INTRAVENOUS | Status: DC
  Filled 2020-06-07 (×3): qty 1000

## 2020-06-07 MED ORDER — CHLORHEXIDINE GLUCONATE 0.12 % MT SOLN
15.0000 mL | Freq: Once | OROMUCOSAL | Status: AC
  Administered 2020-06-07: 15 mL via OROMUCOSAL
  Filled 2020-06-07: qty 15

## 2020-06-07 MED ORDER — HYDROMORPHONE HCL 1 MG/ML IJ SOLN
0.2500 mg | INTRAMUSCULAR | Status: DC | PRN
  Administered 2020-06-07 (×3): 0.5 mg via INTRAVENOUS

## 2020-06-07 MED ORDER — MIDAZOLAM HCL 2 MG/2ML IJ SOLN
1.0000 mg | Freq: Once | INTRAMUSCULAR | Status: AC
  Administered 2020-06-07: 1 mg via INTRAVENOUS

## 2020-06-07 MED ORDER — VITAMIN D (ERGOCALCIFEROL) 1.25 MG (50000 UNIT) PO CAPS
50000.0000 [IU] | ORAL_CAPSULE | ORAL | Status: DC
  Administered 2020-06-09 – 2020-06-10 (×2): 50000 [IU] via ORAL
  Filled 2020-06-07 (×2): qty 1

## 2020-06-07 SURGICAL SUPPLY — 70 items
BANDAGE ESMARK 6X9 LF (GAUZE/BANDAGES/DRESSINGS) ×1 IMPLANT
BLADE CLIPPER SURG (BLADE) IMPLANT
BNDG CMPR 9X6 STRL LF SNTH (GAUZE/BANDAGES/DRESSINGS) ×1
BNDG COHESIVE 4X5 TAN STRL (GAUZE/BANDAGES/DRESSINGS) IMPLANT
BNDG ELASTIC 4X5.8 VLCR STR LF (GAUZE/BANDAGES/DRESSINGS) ×3 IMPLANT
BNDG ELASTIC 6X5.8 VLCR STR LF (GAUZE/BANDAGES/DRESSINGS) ×3 IMPLANT
BNDG ESMARK 6X9 LF (GAUZE/BANDAGES/DRESSINGS) ×3
BNDG GAUZE ELAST 4 BULKY (GAUZE/BANDAGES/DRESSINGS) ×3 IMPLANT
BONE CANC CHIPS 40CC CAN1/2 (Bone Implant) ×3 IMPLANT
BRUSH SCRUB EZ PLAIN DRY (MISCELLANEOUS) ×6 IMPLANT
CHIPS CANC BONE 40CC CAN1/2 (Bone Implant) ×1 IMPLANT
COVER MAYO STAND STRL (DRAPES) ×3 IMPLANT
COVER WAND RF STERILE (DRAPES) ×3 IMPLANT
DRAPE C-ARM 42X72 X-RAY (DRAPES) ×3 IMPLANT
DRAPE C-ARMOR (DRAPES) ×3 IMPLANT
DRAPE HALF SHEET 40X57 (DRAPES) ×6 IMPLANT
DRAPE INCISE IOBAN 66X45 STRL (DRAPES) ×3 IMPLANT
DRAPE U-SHAPE 47X51 STRL (DRAPES) ×3 IMPLANT
DRSG ADAPTIC 3X8 NADH LF (GAUZE/BANDAGES/DRESSINGS) ×3 IMPLANT
DRSG MEPILEX BORDER 4X8 (GAUZE/BANDAGES/DRESSINGS) ×2 IMPLANT
DRSG PAD ABDOMINAL 8X10 ST (GAUZE/BANDAGES/DRESSINGS) ×12 IMPLANT
ELECT REM PT RETURN 9FT ADLT (ELECTROSURGICAL) ×3
ELECTRODE REM PT RTRN 9FT ADLT (ELECTROSURGICAL) ×1 IMPLANT
GAUZE SPONGE 4X4 12PLY STRL (GAUZE/BANDAGES/DRESSINGS) ×3 IMPLANT
GLOVE BIO SURGEON STRL SZ7.5 (GLOVE) ×3 IMPLANT
GLOVE BIO SURGEON STRL SZ8 (GLOVE) ×3 IMPLANT
GLOVE BIOGEL PI IND STRL 7.5 (GLOVE) ×1 IMPLANT
GLOVE BIOGEL PI INDICATOR 7.5 (GLOVE) ×2
GLOVE SRG 8 PF TXTR STRL LF DI (GLOVE) ×1 IMPLANT
GLOVE SURG UNDER POLY LF SZ6.5 (GLOVE) ×2 IMPLANT
GLOVE SURG UNDER POLY LF SZ8 (GLOVE) ×3
GLOVE XGUARD RR 2 7.5 (GLOVE) ×1 IMPLANT
GLOVE XGUARD RR2 7.5 (GLOVE) ×3
GOWN STRL REUS W/ TWL LRG LVL3 (GOWN DISPOSABLE) ×2 IMPLANT
GOWN STRL REUS W/ TWL XL LVL3 (GOWN DISPOSABLE) ×1 IMPLANT
GOWN STRL REUS W/TWL LRG LVL3 (GOWN DISPOSABLE) ×6
GOWN STRL REUS W/TWL XL LVL3 (GOWN DISPOSABLE) ×3
GRAFT BNE CHIP CANC 1-8 40 (Bone Implant) IMPLANT
KIT BASIN OR (CUSTOM PROCEDURE TRAY) ×3 IMPLANT
KIT TURNOVER KIT B (KITS) ×3 IMPLANT
MANIFOLD NEPTUNE II (INSTRUMENTS) ×3 IMPLANT
NS IRRIG 1000ML POUR BTL (IV SOLUTION) ×3 IMPLANT
PACK ORTHO EXTREMITY (CUSTOM PROCEDURE TRAY) ×3 IMPLANT
PAD ARMBOARD 7.5X6 YLW CONV (MISCELLANEOUS) ×6 IMPLANT
PAD CAST 4YDX4 CTTN HI CHSV (CAST SUPPLIES) ×1 IMPLANT
PADDING CAST COTTON 4X4 STRL (CAST SUPPLIES) ×3
PADDING CAST COTTON 6X4 STRL (CAST SUPPLIES) ×3 IMPLANT
SPONGE LAP 18X18 RF (DISPOSABLE) IMPLANT
SPONGE SURGIFOAM ABS GEL 100 (HEMOSTASIS) ×2 IMPLANT
STAPLER VISISTAT 35W (STAPLE) ×3 IMPLANT
STOCKINETTE IMPERVIOUS LG (DRAPES) IMPLANT
SUCTION FRAZIER HANDLE 10FR (MISCELLANEOUS) ×3
SUCTION TUBE FRAZIER 10FR DISP (MISCELLANEOUS) ×1 IMPLANT
SUT ETHILON 3 0 PS 1 (SUTURE) IMPLANT
SUT PDS AB 0 CT 36 (SUTURE) ×2 IMPLANT
SUT PDS AB 2-0 CT1 27 (SUTURE) ×6 IMPLANT
SUT VIC AB 0 CT1 27 (SUTURE) ×3
SUT VIC AB 0 CT1 27XBRD ANBCTR (SUTURE) ×1 IMPLANT
SUT VIC AB 1 CT1 27 (SUTURE) ×3
SUT VIC AB 1 CT1 27XBRD ANBCTR (SUTURE) ×1 IMPLANT
SUT VIC AB 2-0 CT1 27 (SUTURE) ×6
SUT VIC AB 2-0 CT1 TAPERPNT 27 (SUTURE) ×2 IMPLANT
SYR CONTROL 10ML LL (SYRINGE) ×2 IMPLANT
TOWEL GREEN STERILE (TOWEL DISPOSABLE) ×6 IMPLANT
TOWEL GREEN STERILE FF (TOWEL DISPOSABLE) ×3 IMPLANT
TRAY FOLEY MTR SLVR 16FR STAT (SET/KITS/TRAYS/PACK) IMPLANT
TUBE CONNECTING 12'X1/4 (SUCTIONS) ×1
TUBE CONNECTING 12X1/4 (SUCTIONS) ×2 IMPLANT
WATER STERILE IRR 1000ML POUR (IV SOLUTION) ×6 IMPLANT
YANKAUER SUCT BULB TIP NO VENT (SUCTIONS) ×3 IMPLANT

## 2020-06-07 NOTE — Plan of Care (Signed)
  Problem: Education: Goal: Knowledge of General Education information will improve Description: Including pain rating scale, medication(s)/side effects and non-pharmacologic comfort measures Outcome: Progressing   Problem: Health Behavior/Discharge Planning: Goal: Ability to manage health-related needs will improve Outcome: Progressing   Problem: Activity: Goal: Risk for activity intolerance will decrease Outcome: Progressing   Problem: Nutrition: Goal: Adequate nutrition will be maintained Outcome: Progressing   Problem: Elimination: Goal: Will not experience complications related to urinary retention Outcome: Progressing   Problem: Safety: Goal: Ability to remain free from injury will improve Outcome: Progressing   Problem: Coping: Goal: Level of anxiety will decrease Outcome: Not Progressing   Problem: Pain Managment: Goal: General experience of comfort will improve Outcome: Not Progressing

## 2020-06-07 NOTE — Anesthesia Procedure Notes (Signed)
Procedure Name: LMA Insertion Date/Time: 06/07/2020 11:38 AM Performed by: Lovie Chol, CRNA Pre-anesthesia Checklist: Patient identified, Emergency Drugs available, Suction available and Patient being monitored Patient Re-evaluated:Patient Re-evaluated prior to induction Oxygen Delivery Method: Circle System Utilized Preoxygenation: Pre-oxygenation with 100% oxygen Induction Type: IV induction Ventilation: Mask ventilation without difficulty LMA: LMA inserted LMA Size: 4.0 Number of attempts: 1 Airway Equipment and Method: Bite block Placement Confirmation: positive ETCO2 Tube secured with: Tape Dental Injury: Teeth and Oropharynx as per pre-operative assessment
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
## 2020-06-07 NOTE — Anesthesia Preprocedure Evaluation (Addendum)
Anesthesia Evaluation  Patient identified by MRN, date of birth, ID band Patient awake    Reviewed: Allergy & Precautions, NPO status , Patient's Chart, lab work & pertinent test results  Airway Mallampati: I  TM Distance: >3 FB Neck ROM: Full    Dental  (+) Teeth Intact, Dental Advisory Given   Pulmonary asthma , Current Smoker and Patient abstained from smoking.,    Pulmonary exam normal breath sounds clear to auscultation       Cardiovascular negative cardio ROS Normal cardiovascular exam Rhythm:Regular Rate:Normal     Neuro/Psych  Headaches, PSYCHIATRIC DISORDERS Anxiety Depression    GI/Hepatic negative GI ROS, Neg liver ROS,   Endo/Other  negative endocrine ROS  Renal/GU negative Renal ROS     Musculoskeletal NONUNION RIGHT TIBIAL PILON; RETAINED ANTIBIOTIC SPACER   Abdominal   Peds  Hematology negative hematology ROS (+)   Anesthesia Other Findings Day of surgery medications reviewed with the patient.  Reproductive/Obstetrics                            Anesthesia Physical Anesthesia Plan  ASA: II  Anesthesia Plan: General   Post-op Pain Management:    Induction: Intravenous  PONV Risk Score and Plan: 3 and Midazolam, Dexamethasone and Ondansetron  Airway Management Planned: LMA  Additional Equipment:   Intra-op Plan:   Post-operative Plan: Extubation in OR  Informed Consent: I have reviewed the patients History and Physical, chart, labs and discussed the procedure including the risks, benefits and alternatives for the proposed anesthesia with the patient or authorized representative who has indicated his/her understanding and acceptance.     Dental advisory given  Plan Discussed with: CRNA  Anesthesia Plan Comments:         Anesthesia Quick Evaluation

## 2020-06-07 NOTE — Transfer of Care (Signed)
Immediate Anesthesia Transfer of Care Note  Patient: Ashley Simon  Procedure(s) Performed: NONUNION REPAIR WITH HARVEST OF ILIAC BONE GRAFT (Right Leg Lower)  Patient Location: PACU  Anesthesia Type:General  Level of Consciousness: awake, oriented and patient cooperative  Airway & Oxygen Therapy: Patient Spontanous Breathing and Patient connected to nasal cannula oxygen  Post-op Assessment: Report given to RN and Post -op Vital signs reviewed and stable  Post vital signs: Reviewed  Last Vitals:  Vitals Value Taken Time  BP 124/95 06/07/20 1355  Temp    Pulse 82 06/07/20 1402  Resp 15 06/07/20 1402  SpO2 91 % 06/07/20 1402  Vitals shown include unvalidated device data.  Last Pain:  Vitals:   06/07/20 0932  TempSrc:   PainSc: 6       Patients Stated Pain Goal: 3 (06/07/20 0932)  Complications: No complications documented.

## 2020-06-07 NOTE — Anesthesia Postprocedure Evaluation (Signed)
Anesthesia Post Note  Patient: Ashley Simon  Procedure(s) Performed: NONUNION REPAIR WITH HARVEST OF ILIAC BONE GRAFT (Right Leg Lower)     Patient location during evaluation: PACU Anesthesia Type: General Level of consciousness: awake and alert Pain management: pain level controlled Vital Signs Assessment: post-procedure vital signs reviewed and stable Respiratory status: spontaneous breathing, nonlabored ventilation, respiratory function stable and patient connected to nasal cannula oxygen Cardiovascular status: blood pressure returned to baseline and stable Postop Assessment: no apparent nausea or vomiting Anesthetic complications: no   No complications documented.  Last Vitals:  Vitals:   06/07/20 1625 06/07/20 1645  BP: 122/85 112/75  Pulse: 86 84  Resp: 12 16  Temp:  36.6 C  SpO2: 100% 98%    Last Pain:  Vitals:   06/07/20 1645  TempSrc: Oral  PainSc: Asleep                 Cecile Hearing

## 2020-06-08 ENCOUNTER — Encounter (HOSPITAL_COMMUNITY): Payer: Self-pay | Admitting: Orthopedic Surgery

## 2020-06-08 DIAGNOSIS — S82871N Displaced pilon fracture of right tibia, subsequent encounter for open fracture type IIIA, IIIB, or IIIC with nonunion: Secondary | ICD-10-CM | POA: Diagnosis not present

## 2020-06-08 MED ORDER — DOCUSATE SODIUM 100 MG PO CAPS: 100.0000 mg | ORAL_CAPSULE | Freq: Two times a day (BID) | ORAL | 0 refills | Status: DC

## 2020-06-08 MED ORDER — METHOCARBAMOL 500 MG PO TABS: 500.0000 mg | ORAL_TABLET | Freq: Four times a day (QID) | ORAL | 1 refills | Status: DC | PRN

## 2020-06-08 MED ORDER — ACETAMINOPHEN 500 MG PO TABS: 1000.0000 mg | ORAL_TABLET | Freq: Three times a day (TID) | ORAL | 0 refills | Status: AC | PRN

## 2020-06-08 MED ORDER — VITAMIN D 125 MCG (5000 UT) PO CAPS: 1.0000 | ORAL_CAPSULE | Freq: Every day | ORAL | 6 refills | Status: DC

## 2020-06-08 MED ORDER — ASPIRIN 325 MG PO TABS: 325.0000 mg | ORAL_TABLET | Freq: Every day | ORAL | 0 refills | Status: AC

## 2020-06-08 NOTE — Discharge Instructions (Signed)
Orthopaedic Trauma Service Discharge Instructions   General Discharge Instructions  WEIGHT BEARING STATUS: Nonweightbearing Right leg, use crutches or walker   RANGE OF MOTION/ACTIVITY: unrestricted motion or Right hip and knee. No ankle motion as your are splinted. Please bring black cam boot to first post op appointment   Bone health:  Vitamin d levels look better. Continue taking vitamin d supplements   Wound Care: daily wound care to Right hip. 4x4 gauze and tape. Can leave open to the air once there is no drainage. Clean with soap and water only.  Do not remove splint on Right ankle    Diet: as you were eating previously.  Can use over the counter stool softeners and bowel preparations, such as Miralax, to help with bowel movements.  Narcotics can be constipating.  Be sure to drink plenty of fluids  PAIN MEDICATION USE AND EXPECTATIONS  You have likely been given narcotic medications to help control your pain.  After a traumatic event that results in an fracture (broken bone) with or without surgery, it is ok to use narcotic pain medications to help control one's pain.  We understand that everyone responds to pain differently and each individual patient will be evaluated on a regular basis for the continued need for narcotic medications. Ideally, narcotic medication use should last no more than 6-8 weeks (coinciding with fracture healing).   As a patient it is your responsibility as well to monitor narcotic medication use and report the amount and frequency you use these medications when you come to your office visit.   We would also advise that if you are using narcotic medications, you should take a dose prior to therapy to maximize you participation.  IF YOU ARE ON NARCOTIC MEDICATIONS IT IS NOT PERMISSIBLE TO OPERATE A MOTOR VEHICLE (MOTORCYCLE/CAR/TRUCK/MOPED) OR HEAVY MACHINERY DO NOT MIX NARCOTICS WITH OTHER CNS (CENTRAL NERVOUS SYSTEM) DEPRESSANTS SUCH AS ALCOHOL   STOP  SMOKING OR USING NICOTINE PRODUCTS!!!!  As discussed nicotine severely impairs your body's ability to heal surgical and traumatic wounds but also impairs bone healing.  Wounds and bone heal by forming microscopic blood vessels (angiogenesis) and nicotine is a vasoconstrictor (essentially, shrinks blood vessels).  Therefore, if vasoconstriction occurs to these microscopic blood vessels they essentially disappear and are unable to deliver necessary nutrients to the healing tissue.  This is one modifiable factor that you can do to dramatically increase your chances of healing your injury.    (This means no smoking, no nicotine gum, patches, etc)  DO NOT USE NONSTEROIDAL ANTI-INFLAMMATORY DRUGS (NSAID'S)  Using products such as Advil (ibuprofen), Aleve (naproxen), Motrin (ibuprofen) for additional pain control during fracture healing can delay and/or prevent the healing response.  If you would like to take over the counter (OTC) medication, Tylenol (acetaminophen) is ok.  However, some narcotic medications that are given for pain control contain acetaminophen as well. Therefore, you should not exceed more than 4000 mg of tylenol in a day if you do not have liver disease.  Also note that there are may OTC medicines, such as cold medicines and allergy medicines that my contain tylenol as well.  If you have any questions about medications and/or interactions please ask your doctor/PA or your pharmacist.      ICE AND ELEVATE INJURED/OPERATIVE EXTREMITY  Using ice and elevating the injured extremity above your heart can help with swelling and pain control.  Icing in a pulsatile fashion, such as 20 minutes on and 20 minutes off, can  be followed.    Do not place ice directly on skin. Make sure there is a barrier between to skin and the ice pack.    Using frozen items such as frozen peas works well as the conform nicely to the are that needs to be iced.  USE AN ACE WRAP OR TED HOSE FOR SWELLING CONTROL  In  addition to icing and elevation, Ace wraps or TED hose are used to help limit and resolve swelling.  It is recommended to use Ace wraps or TED hose until you are informed to stop.    When using Ace Wraps start the wrapping distally (farthest away from the body) and wrap proximally (closer to the body)   Example: If you had surgery on your leg or thing and you do not have a splint on, start the ace wrap at the toes and work your way up to the thigh        If you had surgery on your upper extremity and do not have a splint on, start the ace wrap at your fingers and work your way up to the upper arm  IF YOU ARE IN A SPLINT OR CAST DO NOT REMOVE IT FOR ANY REASON   If your splint gets wet for any reason please contact the office immediately. You may shower in your splint or cast as long as you keep it dry.  This can be done by wrapping in a cast cover or garbage back (or similar)  Do Not stick any thing down your splint or cast such as pencils, money, or hangers to try and scratch yourself with.  If you feel itchy take benadryl as prescribed on the bottle for itching  IF YOU ARE IN A CAM BOOT (BLACK BOOT)  You may remove boot periodically. Perform daily dressing changes as noted below.  Wash the liner of the boot regularly and wear a sock when wearing the boot. It is recommended that you sleep in the boot until told otherwise    Call office for the following:  Temperature greater than 101F  Persistent nausea and vomiting  Severe uncontrolled pain  Redness, tenderness, or signs of infection (pain, swelling, redness, odor or green/yellow discharge around the site)  Difficulty breathing, headache or visual disturbances  Hives  Persistent dizziness or light-headedness  Extreme fatigue  Any other questions or concerns you may have after discharge  In an emergency, call 911 or go to an Emergency Department at a nearby hospital  HELPFUL INFORMATION  ? If you had a block, it will wear off  between 8-24 hrs postop typically.  This is period when your pain may go from nearly zero to the pain you would have had postop without the block.  This is an abrupt transition but nothing dangerous is happening.  You may take an extra dose of narcotic when this happens.  ? You should wean off your narcotic medicines as soon as you are able.  Most patients will be off or using minimal narcotics before their first postop appointment.   ? We suggest you use the pain medication the first night prior to going to bed, in order to ease any pain when the anesthesia wears off. You should avoid taking pain medications on an empty stomach as it will make you nauseous.  ? Do not drink alcoholic beverages or take illicit drugs when taking pain medications.  ? In most states it is against the law to drive while you are in a  splint or sling.  And certainly against the law to drive while taking narcotics.  ? You may return to work/school in the next couple of days when you feel up to it.   ? Pain medication may make you constipated.  Below are a few solutions to try in this order: - Decrease the amount of pain medication if you aren't having pain. - Drink lots of decaffeinated fluids. - Drink prune juice and/or each dried prunes  o If the first 3 don't work start with additional solutions - Take Colace - an over-the-counter stool softener - Take Senokot - an over-the-counter laxative - Take Miralax - a stronger over-the-counter laxative     CALL THE OFFICE WITH ANY QUESTIONS OR CONCERNS: [phone]   VISIT OUR WEBSITE FOR ADDITIONAL INFORMATION: [email or web address]
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
## 2020-06-08 NOTE — Progress Notes (Signed)
PT Cancellation Note  Patient Details Name: Ashley Simon MRN: [id number] DOB: April 24, 1985   Cancelled Treatment:    Reason Eval/Treat Not Completed: Pain limiting ability to participate. Patient tearful, unwilling to get up or do exercises in bed. Will re-attempt later as time allows.    Brittanni Cariker 06/08/2020, 10:20 AM

## 2020-06-08 NOTE — Progress Notes (Signed)
Orthopaedic Trauma Service Progress Note  Patient ID: Ashley Simon MRN: [id number] DOB/AGE: February 28, 1986 35 y.o.  Subjective:  Doing ok  Pain pretty severe at R iliac crest harvest site Does not want to get up with therapies today   No other complaints    Review of Systems  Constitutional: Negative for chills and fever.  Respiratory: Negative for shortness of breath and wheezing.   Cardiovascular: Negative for chest pain and palpitations.  Gastrointestinal: Negative for abdominal pain.   As above  Objective:   VITALS:   Vitals:   06/08/20 0138 06/08/20 0444 06/08/20 0444 06/08/20 0753  BP: 97/63 (!) 99/56 (!) 99/56 (!) 139/93  Pulse: 96 85 85 77  Resp: 16 16 18 17   Temp: 98.5 F (36.9 C) 98.6 F (37 C) 98.6 F (37 C) 98.7 F (37.1 C)  TempSrc:  Oral Oral Oral  SpO2:  100% 100% 100%  Weight:      Height:        Estimated body mass index is 29.03 kg/m as calculated from the following:   Height as of this encounter: 5\' 2"  (1.575 m).   Weight as of this encounter: 72 kg.   Intake/Output      02/17 0701 02/18 0700 02/18 0701 02/19 0700   P.O. 480 240   I.V. (mL/kg) 1801.2 (25)    IV Piggyback 200.3    Total Intake(mL/kg) 2481.5 (34.5) 240 (3.3)   Urine (mL/kg/hr) 0    Blood 100    Total Output 100    Net +2381.5 +240        Urine Occurrence 2 x      LABS  No results found for this or any previous visit (from the past 24 hour(s)).   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 06/07/20 06/09/20 06/07/20 1208    .  POD/HD#: 1  35 y/o female s/p staged autografting of open R pilon fracture with ICBG  -repair/grafting of R pilon fracture nonunion with R ICBG  NWB R leg x 4 weeks  Splint x 2 weeks then convert to CAM  PT/OT evals  Ice and elevate  Dressing change to R iliac crest tomorrow  Splint to remain on until follow up    Dc home tomorrow   - Pain management:  Tylenol and PO  dilaudid   Will increase frequency of dilaudid for the next 7-10 days    Discussed about getting her off all pain meds in 3-4 weeks  - ABL anemia/Hemodynamics  Stable  - Medical issues   Vitamin d def   Much improved   Continue supplementation   - DVT/PE prophylaxis:  No pharmacologics at dc   - ID:   periop abx complete   - Metabolic Bone Disease:  As above  - Activity:  NWB R leg o/w as tolerated  - FEN/GI prophylaxis/Foley/Lines:  Reg diet  NSL IV  -Ex-fix/Splint care:  Do no remove splint R ankle  Do not get splint wet   Call office with issues   - Dispo:  Dc home tomorrow   Rxs set to requested pharmacy    Mearl Latin, PA-C [phone] (C) 06/08/2020, 11:49 AM  [facility] [address] [place] [address] [phone] [phone] (F)    After 5pm and on the weekends please log on to Amion, go to orthopaedics and the look under the [facility] Call for the provider(s) on call. You can also call our office at [phone] and then follow the prompts to be connected to the call team.
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
## 2020-06-09 DIAGNOSIS — S82871N Displaced pilon fracture of right tibia, subsequent encounter for open fracture type IIIA, IIIB, or IIIC with nonunion: Secondary | ICD-10-CM | POA: Diagnosis not present

## 2020-06-09 NOTE — Plan of Care (Signed)

## 2020-06-09 NOTE — Progress Notes (Signed)
SPORTS MEDICINE AND JOINT REPLACEMENT  Georgena Spurling, MD    Laurier Nancy, PA-C [address] [place], [place], [place]  [address]                             [phone]   PROGRESS NOTE  Subjective:  negative for Chest Pain  negative for Shortness of Breath  negative for Nausea/Vomiting   negative for Calf Pain  negative for Bowel Movement   Tolerating Diet: yes         Patient reports pain as 4 on 0-10 scale.    Objective: Vital signs in last 24 hours:   Patient Vitals for the past 24 hrs:  BP Temp Temp src Pulse Resp SpO2  06/09/20 0320 (!) 102/59 98.7 F (37.1 C) Oral 79 16 100 %  06/08/20 1959 (!) 107/56 98.2 F (36.8 C) Oral 92 16 100 %  06/08/20 1300 137/90 98.6 F (37 C) Oral 78 18 100 %    @flow {1959:LAST@   Intake/Output from previous day:   02/18 0701 - 02/19 0700 In: 720 [P.O.:720] Out: -    Intake/Output this shift:   No intake/output data recorded.   Intake/Output      02/18 0701 02/19 0700 02/19 0701 02/20 0700   P.O. 720    I.V. (mL/kg)     IV Piggyback     Total Intake(mL/kg) 720 (10)    Urine (mL/kg/hr)     Blood     Total Output     Net +720         Urine Occurrence 6 x 1 x      LABORATORY DATA: Recent Labs    06/07/20 1001  WBC 7.0  HGB 10.0*  HCT 34.6*  PLT 288   Recent Labs    06/07/20 1001  NA 138  K 3.6  CL 106  CO2 23  BUN 6  CREATININE 0.70  GLUCOSE 95  CALCIUM 9.3   Lab Results  Component Value Date   INR 0.9 06/07/2020   INR 0.9 04/01/2020    Examination:  General appearance: alert, cooperative and no distress Extremities: extremities normal, atraumatic, no cyanosis or edema  Wound Exam: clean, dry, intact   Drainage:  None: wound tissue dry  Motor Exam: Quadriceps and Hamstrings Intact  Sensory Exam: Superficial Peroneal, Deep Peroneal and Tibial normal   Assessment:    2 Days Post-Op  Procedure(s) (LRB): NONUNION REPAIR WITH HARVEST OF ILIAC BONE GRAFT (Right)  ADDITIONAL DIAGNOSIS:   Active Problems:   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 Plan on D/C tomorrow. Remove/change bandage on R hip prior to D/C. Pain under control and normal diet. Patient doing well.   [employer] 06/09/2020, 10:18 AM
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
## 2020-06-10 DIAGNOSIS — S82871N Displaced pilon fracture of right tibia, subsequent encounter for open fracture type IIIA, IIIB, or IIIC with nonunion: Secondary | ICD-10-CM | POA: Diagnosis not present
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
## 2020-06-10 NOTE — Progress Notes (Signed)
Subjective: 3 Days Post-Op Procedure(s) (LRB): NONUNION REPAIR WITH HARVEST OF ILIAC BONE GRAFT (Right) Patient reports pain is under control   Patient is sitting comfortably in bed.  Patient states she is unable to discharge again today due to children having the stomach bug at home.   She is planning on discharging tomorrow.   Objective: Vital signs in last 24 hours: Temp:  [98.4 F (36.9 C)-99.1 F (37.3 C)] 98.4 F (36.9 C) (02/20 0900) Pulse Rate:  [78-100] 100 (02/20 0900) Resp:  [16-19] 17 (02/20 0900) BP: (104-122)/(55-66) 122/66 (02/20 0900) SpO2:  [99 %-100 %] 100 % (02/20 0900)  Intake/Output from previous day: No intake/output data recorded. Intake/Output this shift: No intake/output data recorded.  Recent Labs    06/07/20 1001  HGB 10.0*   Recent Labs    06/07/20 1001  WBC 7.0  RBC 4.17  HCT 34.6*  PLT 288   Recent Labs    06/07/20 1001  NA 138  K 3.6  CL 106  CO2 23  BUN 6  CREATININE 0.70  GLUCOSE 95  CALCIUM 9.3   Recent Labs    06/07/20 1001  INR 0.9    ABD soft Neurovascular intact I changed the right pelvis dressing today.  Clean dry with very little drainage   Assessment/Plan: 3 Days Post-Op Procedure(s) (LRB): NONUNION REPAIR WITH HARVEST OF ILIAC BONE GRAFT (Right) Advance diet Up with therapy Plan for discharge tomorrow  Patient states she is unable to leave today as her children have the stomach bug at home.  Her Aunt is taking care of her children right now but will be unable to pick her up tomorrow.  Her plan is to take a medical transportation [place] home like the one she rode in to the hospital.  Will need social work to confirm that this is set up for tomorrow     Pascal Lux 06/10/2020, 9:56 AM

## 2020-06-11 DIAGNOSIS — S82891C Other fracture of right lower leg, initial encounter for open fracture type IIIA, IIIB, or IIIC: Secondary | ICD-10-CM | POA: Diagnosis not present

## 2020-06-11 DIAGNOSIS — U071 COVID-19: Secondary | ICD-10-CM | POA: Diagnosis not present

## 2020-06-11 DIAGNOSIS — S82141A Displaced bicondylar fracture of right tibia, initial encounter for closed fracture: Secondary | ICD-10-CM | POA: Diagnosis not present

## 2020-06-11 DIAGNOSIS — S82871N Displaced pilon fracture of right tibia, subsequent encounter for open fracture type IIIA, IIIB, or IIIC with nonunion: Secondary | ICD-10-CM | POA: Diagnosis not present
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
## 2020-06-11 NOTE — Progress Notes (Signed)
Pt was given her AVS discharge summary and went over with her. IV was removed with catheter intact. Pt had no further questions. Transportation will be here for her at 4:00 pm.

## 2020-06-11 NOTE — Plan of Care (Signed)
  Problem: Education: Goal: Knowledge of General Education information will improve Description: Including pain rating scale, medication(s)/side effects and non-pharmacologic comfort measures Outcome: Adequate for Discharge   Problem: Health Behavior/Discharge Planning: Goal: Ability to manage health-related needs will improve Outcome: Adequate for Discharge   Problem: Clinical Measurements: Goal: Ability to maintain clinical measurements within normal limits will improve Outcome: Adequate for Discharge Goal: Will remain free from infection Outcome: Adequate for Discharge Goal: Diagnostic test results will improve Outcome: Adequate for Discharge Goal: Respiratory complications will improve Outcome: Adequate for Discharge Goal: Cardiovascular complication will be avoided Outcome: Adequate for Discharge   Problem: Activity: Goal: Risk for activity intolerance will decrease Outcome: Adequate for Discharge   Problem: Nutrition: Goal: Adequate nutrition will be maintained Outcome: Adequate for Discharge   Problem: Coping: Goal: Level of anxiety will decrease Outcome: Adequate for Discharge   Problem: Elimination: Goal: Will not experience complications related to bowel motility Outcome: Adequate for Discharge Goal: Will not experience complications related to urinary retention Outcome: Adequate for Discharge   Problem: Pain Managment: Goal: General experience of comfort will improve Outcome: Adequate for Discharge   Problem: Safety: Goal: Ability to remain free from injury will improve Outcome: Adequate for Discharge   Problem: Skin Integrity: Goal: Risk for impaired skin integrity will decrease Outcome: Adequate for Discharge   Problem: Acute Rehab PT Goals(only PT should resolve) Goal: Pt Will Go Supine/Side To Sit Outcome: Adequate for Discharge Goal: Pt Will Go Sit To Supine/Side Outcome: Adequate for Discharge Goal: Patient Will Transfer Sit To/From  Stand Outcome: Adequate for Discharge Goal: Pt Will Transfer Bed To Chair/Chair To Bed Outcome: Adequate for Discharge Goal: Pt Will Ambulate Outcome: Adequate for Discharge   
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
## 2020-06-11 NOTE — Progress Notes (Signed)
Orthopaedic Trauma Service Progress Note  Patient ID: Ashley Simon MRN: [id number] DOB/AGE: 34-Jul-1987 34 y.o.  Subjective:  Doing well Ready to go home today   Events over weekend noted   C/o splint rubbing on little toe   Working with therapies   ROS As above Objective:   VITALS:   Vitals:   06/10/20 0900 06/10/20 1541 06/10/20 1900 06/11/20 0200  BP: 122/66 (!) 100/56 101/68 120/68  Pulse: 100 86 97 88  Resp: 17  16 17   Temp: 98.4 F (36.9 C)  99.1 F (37.3 C) 98.2 F (36.8 C)  TempSrc: Oral  Oral Oral  SpO2: 100%  98% 100%  Weight:      Height:        Estimated body mass index is 29.03 kg/m as calculated from the following:   Height as of this encounter: 5\' 2"  (1.575 m).   Weight as of this encounter: 72 kg.   Intake/Output    None     LABS  No results found for this or any previous visit (from the past 24 hour(s)).   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 06/07/20 06/09/20 06/07/20 1208    .  POD/HD#:   35 y/o  female s/p staged autografting of open R pilon fracture with ICBG   -repair/grafting of R pilon fracture nonunion with R ICBG             NWB R leg x 4 weeks             Splint x 2 weeks then convert to CAM             PT/OT evals             Ice and elevate                        Splint to remain on until follow up  Ortho tech to trim splint up to relieve pressure prior to dc                Dc home today   - Pain management:             Tylenol and PO dilaudid                         Will increase frequency of dilaudid for the next 7-10 days                          Discussed about getting her off all pain meds in 3-4 weeks   - ABL anemia/Hemodynamics             Stable   - Medical issues              Vitamin d def                         Much improved                         Continue supplementation    - DVT/PE prophylaxis:             No pharmacologics at dc    - ID:              periop abx complete    - Metabolic Bone Disease:             As above   - Activity:             NWB R leg o/w as tolerated   - FEN/GI prophylaxis/Foley/Lines:             Reg diet             NSL IV   -Ex-fix/Splint care:             Do no remove splint R ankle             Do not get splint wet              Call office with issues    - Dispo:             Dc home today                 Mearl Latin, PA-C [phone] (C) 06/11/2020, 9:37 AM  [facility] [address] [place] [address] [phone] Val [phone] (F)    After 5pm and on the weekends please log on to Amion, go to orthopaedics and the look under the [facility] Call for the provider(s) on call. You can also call our office at [phone] and then follow the prompts to be connected to the call team.
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
## 2020-06-20 DIAGNOSIS — S82111D Displaced fracture of right tibial spine, subsequent encounter for closed fracture with routine healing: Secondary | ICD-10-CM | POA: Diagnosis not present

## 2020-06-20 DIAGNOSIS — S82141D Displaced bicondylar fracture of right tibia, subsequent encounter for closed fracture with routine healing: Secondary | ICD-10-CM | POA: Diagnosis not present

## 2020-06-30 ENCOUNTER — Emergency Department (HOSPITAL_COMMUNITY): Payer: Medicaid Other

## 2020-06-30 ENCOUNTER — Encounter (HOSPITAL_COMMUNITY): Payer: Self-pay | Admitting: Emergency Medicine

## 2020-06-30 ENCOUNTER — Emergency Department (HOSPITAL_COMMUNITY)
Admission: EM | Admit: 2020-06-30 | Discharge: 2020-06-30 | Disposition: A | Discharge status: Home / Self Care | Payer: Medicaid Other | Attending: Emergency Medicine | Admitting: Emergency Medicine

## 2020-06-30 ENCOUNTER — Other Ambulatory Visit: Payer: Self-pay

## 2020-06-30 DIAGNOSIS — M25561 Pain in right knee: Secondary | ICD-10-CM | POA: Diagnosis not present

## 2020-06-30 DIAGNOSIS — M79604 Pain in right leg: Secondary | ICD-10-CM

## 2020-06-30 DIAGNOSIS — J45909 Unspecified asthma, uncomplicated: Secondary | ICD-10-CM | POA: Diagnosis not present

## 2020-06-30 DIAGNOSIS — M25571 Pain in right ankle and joints of right foot: Secondary | ICD-10-CM | POA: Insufficient documentation

## 2020-06-30 DIAGNOSIS — Z8616 Personal history of COVID-19: Secondary | ICD-10-CM | POA: Diagnosis not present

## 2020-06-30 DIAGNOSIS — S32391A Other fracture of right ilium, initial encounter for closed fracture: Secondary | ICD-10-CM | POA: Diagnosis not present

## 2020-06-30 DIAGNOSIS — W19XXXA Unspecified fall, initial encounter: Secondary | ICD-10-CM

## 2020-06-30 DIAGNOSIS — F17210 Nicotine dependence, cigarettes, uncomplicated: Secondary | ICD-10-CM | POA: Insufficient documentation

## 2020-06-30 DIAGNOSIS — W010XXA Fall on same level from slipping, tripping and stumbling without subsequent striking against object, initial encounter: Secondary | ICD-10-CM | POA: Diagnosis not present

## 2020-06-30 DIAGNOSIS — S82191D Other fracture of upper end of right tibia, subsequent encounter for closed fracture with routine healing: Secondary | ICD-10-CM | POA: Diagnosis not present

## 2020-06-30 LAB — BASIC METABOLIC PANEL
Anion gap: 9 (ref 5–15)
BUN: 8 mg/dL (ref 6–20)
CO2: 21 mmol/L — ABNORMAL LOW (ref 22–32)
Calcium: 9 mg/dL (ref 8.9–10.3)
Chloride: 104 mmol/L (ref 98–111)
Creatinine, Ser: 0.65 mg/dL (ref 0.44–1.00)
GFR, Estimated: >60 mL/min (ref >60)
Glucose, Bld: 95 mg/dL (ref 70–99)
Potassium: 3.4 mmol/L — ABNORMAL LOW (ref 3.5–5.1)
Sodium: 134 mmol/L — ABNORMAL LOW (ref 135–145)

## 2020-06-30 LAB — CBC WITH DIFFERENTIAL/PLATELET
Abs Immature Granulocytes: 0.03 10*3/uL (ref 0.00–0.07)
Basophils Absolute: 0.1 10*3/uL (ref 0.0–0.1)
Basophils Relative: 1 %
Eosinophils Absolute: 0.1 10*3/uL (ref 0.0–0.5)
Eosinophils Relative: 1 %
HCT: 36.6 % (ref 36.0–46.0)
Hemoglobin: 10.7 g/dL — ABNORMAL LOW (ref 12.0–15.0)
Immature Granulocytes: 0 %
Lymphocytes Relative: 25 %
Lymphs Abs: 2.1 10*3/uL (ref 0.7–4.0)
MCH: 23.9 pg — ABNORMAL LOW (ref 26.0–34.0)
MCHC: 29.2 g/dL — ABNORMAL LOW (ref 30.0–36.0)
MCV: 81.9 fL (ref 80.0–100.0)
Monocytes Absolute: 0.8 10*3/uL (ref 0.1–1.0)
Monocytes Relative: 10 %
Neutro Abs: 5.3 10*3/uL (ref 1.7–7.7)
Neutrophils Relative %: 63 %
Platelets: 460 10*3/uL — ABNORMAL HIGH (ref 150–400)
RBC: 4.47 MIL/uL (ref 3.87–5.11)
RDW: 16.9 % — ABNORMAL HIGH (ref 11.5–15.5)
WBC: 8.5 10*3/uL (ref 4.0–10.5)
nRBC: 0 % (ref 0.0–0.2)

## 2020-06-30 LAB — I-STAT BETA HCG BLOOD, ED (MC, WL, AP ONLY): I-stat hCG, quantitative: <5 m[IU]/mL (ref <5)

## 2020-06-30 MED ORDER — HYDROMORPHONE HCL 1 MG/ML IJ SOLN
1.0000 mg | Freq: Once | INTRAMUSCULAR | Status: AC
  Administered 2020-06-30: 1 mg via INTRAMUSCULAR
  Filled 2020-06-30: qty 1

## 2020-06-30 MED ORDER — IOHEXOL 300 MG/ML  SOLN
100.0000 mL | Freq: Once | INTRAMUSCULAR | Status: AC | PRN
  Administered 2020-06-30: 100 mL via INTRAVENOUS
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
## 2020-06-30 NOTE — ED Notes (Signed)
Lab at bedside

## 2020-06-30 NOTE — Discharge Instructions (Addendum)
Call Dr. Carola Frost to be seen in the office early next week. Follow your previous DC orders in regards to weight bearing and activity. Call Dr. Magdalene Patricia office for your pain medication refills.  Return to the ED with worsening pain, weakness, numbness, tingling, or any other concerns.
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
## 2020-07-11 DIAGNOSIS — S82141D Displaced bicondylar fracture of right tibia, subsequent encounter for closed fracture with routine healing: Secondary | ICD-10-CM | POA: Diagnosis not present

## 2020-07-11 DIAGNOSIS — S82111D Displaced fracture of right tibial spine, subsequent encounter for closed fracture with routine healing: Secondary | ICD-10-CM | POA: Diagnosis not present

## 2020-07-23 ENCOUNTER — Telehealth (HOSPITAL_COMMUNITY): Payer: Self-pay

## 2020-07-23 ENCOUNTER — Ambulatory Visit (HOSPITAL_COMMUNITY): Payer: Medicaid Other

## 2020-07-23 NOTE — Telephone Encounter (Signed)
PT CANCELLED APPT FOR TODAY BECAUSE TRANSPORTATION CAN NOT PICK HER UP

## 2020-07-25 ENCOUNTER — Other Ambulatory Visit: Payer: Self-pay

## 2020-07-25 ENCOUNTER — Ambulatory Visit (HOSPITAL_COMMUNITY): Payer: Medicaid Other | Attending: Orthopedic Surgery

## 2020-07-25 DIAGNOSIS — R262 Difficulty in walking, not elsewhere classified: Secondary | ICD-10-CM

## 2020-07-25 DIAGNOSIS — R2681 Unsteadiness on feet: Secondary | ICD-10-CM

## 2020-07-25 DIAGNOSIS — M6281 Muscle weakness (generalized): Secondary | ICD-10-CM | POA: Diagnosis present

## 2020-07-25 DIAGNOSIS — M79661 Pain in right lower leg: Secondary | ICD-10-CM | POA: Diagnosis present

## 2020-07-25 NOTE — Patient Instructions (Signed)
Access Code: [email or web address] URL: [email or web address] Date: 07/25/2020 Prepared by: Shary Decamp  Exercises Seated Toe Towel Scrunches - 1 x daily - 7 x weekly - 3 sets - 10 reps Big toe mobilization - 1 x daily - 7 x weekly - 3 sets - 10 reps Supine Quad Set - 1 x daily - 7 x weekly - 3 sets - 10 reps Supine Knee Extension Mobilization with Weight - 1 x daily - 7 x weekly - 3 sets - 10 reps Supine Heel Slide with Small Ball - 1 x daily - 7 x weekly - 3 sets - 10 reps Supine Hip Adduction Isometric with Ball - 1 x daily - 7 x weekly - 3 sets - 10 reps
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
## 2020-07-30 ENCOUNTER — Ambulatory Visit (HOSPITAL_COMMUNITY): Payer: Medicaid Other | Admitting: Physical Therapy

## 2020-07-30 ENCOUNTER — Other Ambulatory Visit: Payer: Self-pay

## 2020-07-30 DIAGNOSIS — R262 Difficulty in walking, not elsewhere classified: Secondary | ICD-10-CM | POA: Diagnosis not present

## 2020-07-30 DIAGNOSIS — M79661 Pain in right lower leg: Secondary | ICD-10-CM

## 2020-07-30 DIAGNOSIS — M6281 Muscle weakness (generalized): Secondary | ICD-10-CM

## 2020-07-30 DIAGNOSIS — R2681 Unsteadiness on feet: Secondary | ICD-10-CM
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
## 2020-08-02 ENCOUNTER — Ambulatory Visit (HOSPITAL_COMMUNITY): Payer: Medicaid Other | Admitting: Physical Therapy

## 2020-08-02 ENCOUNTER — Encounter (HOSPITAL_COMMUNITY): Payer: Self-pay | Admitting: Physical Therapy

## 2020-08-02 ENCOUNTER — Other Ambulatory Visit: Payer: Self-pay

## 2020-08-02 DIAGNOSIS — R2681 Unsteadiness on feet: Secondary | ICD-10-CM

## 2020-08-02 DIAGNOSIS — M79661 Pain in right lower leg: Secondary | ICD-10-CM

## 2020-08-02 DIAGNOSIS — R262 Difficulty in walking, not elsewhere classified: Secondary | ICD-10-CM | POA: Diagnosis not present

## 2020-08-02 DIAGNOSIS — M6281 Muscle weakness (generalized): Secondary | ICD-10-CM
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
## 2020-08-07 ENCOUNTER — Ambulatory Visit (HOSPITAL_COMMUNITY): Payer: Medicaid Other | Admitting: Physical Therapy

## 2020-08-10 ENCOUNTER — Other Ambulatory Visit: Payer: Self-pay

## 2020-08-10 ENCOUNTER — Ambulatory Visit (HOSPITAL_COMMUNITY): Payer: Medicaid Other

## 2020-08-10 ENCOUNTER — Encounter (HOSPITAL_COMMUNITY): Payer: Self-pay

## 2020-08-10 DIAGNOSIS — M79661 Pain in right lower leg: Secondary | ICD-10-CM

## 2020-08-10 DIAGNOSIS — M6281 Muscle weakness (generalized): Secondary | ICD-10-CM

## 2020-08-10 DIAGNOSIS — R262 Difficulty in walking, not elsewhere classified: Secondary | ICD-10-CM

## 2020-08-10 DIAGNOSIS — R2681 Unsteadiness on feet: Secondary | ICD-10-CM
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
## 2020-08-10 NOTE — Patient Instructions (Signed)
Access Code: [email or web address] URL: [email or web address] Date: 08/10/2020 Prepared by: Shary Decamp  Exercises Long Sitting Calf Stretch with Strap - 1 x daily - 7 x weekly - 3 sets - 5 reps - 15-30 sec hold

## 2020-08-13 ENCOUNTER — Telehealth (HOSPITAL_COMMUNITY): Payer: Self-pay

## 2020-08-13 ENCOUNTER — Ambulatory Visit (HOSPITAL_COMMUNITY): Payer: Medicaid Other

## 2020-08-13 NOTE — Telephone Encounter (Signed)
pt cancelled appt for today because she was having trouble with transportation

## 2020-08-15 ENCOUNTER — Encounter (HOSPITAL_COMMUNITY): Payer: Self-pay

## 2020-08-15 ENCOUNTER — Other Ambulatory Visit: Payer: Self-pay

## 2020-08-15 ENCOUNTER — Ambulatory Visit (HOSPITAL_COMMUNITY): Payer: Medicaid Other

## 2020-08-15 DIAGNOSIS — R262 Difficulty in walking, not elsewhere classified: Secondary | ICD-10-CM | POA: Diagnosis not present

## 2020-08-15 DIAGNOSIS — M6281 Muscle weakness (generalized): Secondary | ICD-10-CM

## 2020-08-15 DIAGNOSIS — R2681 Unsteadiness on feet: Secondary | ICD-10-CM

## 2020-08-15 DIAGNOSIS — M79661 Pain in right lower leg: Secondary | ICD-10-CM

## 2020-08-15 NOTE — Patient Instructions (Signed)
Access Code: [email or web address] URL: [email or web address] Date: 08/15/2020 Prepared by: Shary Decamp  Exercises Seated Ankle Inversion with Resistance and Legs Crossed - 1 x daily - 7 x weekly - 3 sets - 10 reps Seated Ankle Eversion with Resistance - 1 x daily - 7 x weekly - 3 sets - 10 reps CLX Ankle Dorsiflexion and Eversion - 1 x daily - 7 x weekly - 3 sets - 10 reps Seated Knee Extension with Resistance - 1 x daily - 7 x weekly - 3 sets - 10 reps Seated Hamstring Curls with Resistance - 1 x daily - 7 x weekly - 3 sets - 10 reps
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
## 2020-08-20 ENCOUNTER — Other Ambulatory Visit: Payer: Self-pay

## 2020-08-20 ENCOUNTER — Ambulatory Visit (HOSPITAL_COMMUNITY): Payer: Medicaid Other | Attending: Orthopedic Surgery

## 2020-08-20 ENCOUNTER — Encounter (HOSPITAL_COMMUNITY): Payer: Self-pay

## 2020-08-20 DIAGNOSIS — R2681 Unsteadiness on feet: Secondary | ICD-10-CM | POA: Insufficient documentation

## 2020-08-20 DIAGNOSIS — R262 Difficulty in walking, not elsewhere classified: Secondary | ICD-10-CM | POA: Insufficient documentation

## 2020-08-20 DIAGNOSIS — M6281 Muscle weakness (generalized): Secondary | ICD-10-CM | POA: Diagnosis present

## 2020-08-20 DIAGNOSIS — M79661 Pain in right lower leg: Secondary | ICD-10-CM | POA: Diagnosis not present
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
## 2020-08-22 ENCOUNTER — Other Ambulatory Visit: Payer: Self-pay

## 2020-08-22 ENCOUNTER — Encounter (HOSPITAL_COMMUNITY): Payer: Self-pay

## 2020-08-22 ENCOUNTER — Ambulatory Visit (HOSPITAL_COMMUNITY): Payer: Medicaid Other

## 2020-08-22 DIAGNOSIS — S82111D Displaced fracture of right tibial spine, subsequent encounter for closed fracture with routine healing: Secondary | ICD-10-CM | POA: Diagnosis not present

## 2020-08-22 DIAGNOSIS — M79661 Pain in right lower leg: Secondary | ICD-10-CM

## 2020-08-22 DIAGNOSIS — M6281 Muscle weakness (generalized): Secondary | ICD-10-CM

## 2020-08-22 DIAGNOSIS — S82141D Displaced bicondylar fracture of right tibia, subsequent encounter for closed fracture with routine healing: Secondary | ICD-10-CM | POA: Diagnosis not present

## 2020-08-22 DIAGNOSIS — R262 Difficulty in walking, not elsewhere classified: Secondary | ICD-10-CM

## 2020-08-22 DIAGNOSIS — S82871N Displaced pilon fracture of right tibia, subsequent encounter for open fracture type IIIA, IIIB, or IIIC with nonunion: Secondary | ICD-10-CM | POA: Diagnosis not present

## 2020-08-22 DIAGNOSIS — R2681 Unsteadiness on feet: Secondary | ICD-10-CM
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
## 2020-08-28 ENCOUNTER — Ambulatory Visit (HOSPITAL_COMMUNITY): Payer: Medicaid Other

## 2020-08-30 ENCOUNTER — Encounter (HOSPITAL_COMMUNITY): Payer: Self-pay | Admitting: Physical Therapy

## 2020-08-30 ENCOUNTER — Ambulatory Visit (HOSPITAL_COMMUNITY): Payer: Medicaid Other | Admitting: Physical Therapy

## 2020-08-30 ENCOUNTER — Other Ambulatory Visit: Payer: Self-pay

## 2020-08-30 DIAGNOSIS — M79661 Pain in right lower leg: Secondary | ICD-10-CM | POA: Diagnosis not present

## 2020-08-30 DIAGNOSIS — R262 Difficulty in walking, not elsewhere classified: Secondary | ICD-10-CM

## 2020-08-30 DIAGNOSIS — M6281 Muscle weakness (generalized): Secondary | ICD-10-CM

## 2020-08-30 DIAGNOSIS — R2681 Unsteadiness on feet: Secondary | ICD-10-CM
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
## 2020-08-30 NOTE — Patient Instructions (Signed)
Access Code: [email or web address] URL: [email or web address] Date: 08/30/2020 Prepared by: Georges Lynch  Exercises Standing Weight Shift Side to Side - 2 x daily - 7 x weekly - 2 sets - 10 reps - 3 second hold Standing Weight Shifting Forward and Backward - 2 x daily - 7 x weekly - 2 sets - 10 reps - 3 second hold Standing Hip Abduction with Counter Support - 2 x daily - 7 x weekly - 2 sets - 10 reps Standing Hip Extension with Counter Support - 2 x daily - 7 x weekly - 2 sets - 10 reps

## 2020-09-03 ENCOUNTER — Ambulatory Visit (HOSPITAL_COMMUNITY): Payer: Medicaid Other

## 2020-09-03 ENCOUNTER — Telehealth (HOSPITAL_COMMUNITY): Payer: Self-pay

## 2020-09-03 NOTE — Telephone Encounter (Signed)
pt cancelled appt for today because she has no transportation

## 2020-09-05 ENCOUNTER — Ambulatory Visit (HOSPITAL_COMMUNITY): Payer: Medicaid Other

## 2020-09-05 ENCOUNTER — Telehealth (HOSPITAL_COMMUNITY): Payer: Self-pay

## 2020-09-05 NOTE — Telephone Encounter (Signed)
pt is sick and has to take a COVID test

## 2020-09-26 DIAGNOSIS — S82871N Displaced pilon fracture of right tibia, subsequent encounter for open fracture type IIIA, IIIB, or IIIC with nonunion: Secondary | ICD-10-CM | POA: Diagnosis not present

## 2020-09-26 DIAGNOSIS — S82871F Displaced pilon fracture of right tibia, subsequent encounter for open fracture type IIIA, IIIB, or IIIC with routine healing: Secondary | ICD-10-CM | POA: Diagnosis not present

## 2020-09-26 DIAGNOSIS — S82141D Displaced bicondylar fracture of right tibia, subsequent encounter for closed fracture with routine healing: Secondary | ICD-10-CM | POA: Diagnosis not present

## 2020-09-26 DIAGNOSIS — S82111D Displaced fracture of right tibial spine, subsequent encounter for closed fracture with routine healing: Secondary | ICD-10-CM | POA: Diagnosis not present

## 2020-10-16 ENCOUNTER — Telehealth: Payer: Self-pay

## 2020-10-16 NOTE — Telephone Encounter (Signed)
   Ashley Simon DOB: 1985/10/08 MRN: [id number]   RIDER WAIVER AND RELEASE OF LIABILITY  For purposes of improving physical access to our facilities, [facility] is pleased to partner with third parties to provide [facility] patients or other authorized individuals the option of convenient, on-demand ground transportation services (the [employer]") through use of the technology service that enables users to request on-demand ground transportation from independent third-party providers.  By opting to use and accept these [employer], I, the undersigned, hereby agree on behalf of myself, and on behalf of any minor child using the Science writer for whom I am the parent or legal guardian, as follows:  Science writer provided to me are provided by independent third-party transportation providers who are not [employer] or employees and who are unaffiliated with [employer]. [place] is neither a transportation carrier nor a common or public carrier. [place] has no control over the quality or safety of the transportation that occurs as a result of the [employer]. [place] cannot guarantee that any third-party transportation provider will complete any arranged transportation service. [facility] makes no representation, warranty, or guarantee regarding the reliability, timeliness, quality, safety, suitability, or availability of any of the Transport Services or that they will be error free. I fully understand that traveling by vehicle involves risks and dangers of serious bodily injury, including permanent disability, paralysis, and death. I agree, on behalf of myself and on behalf of any minor child using the Transport Services for whom I am the parent or legal guardian, that the entire risk arising out of my use of the [employer] remains solely with me, to the maximum extent permitted under applicable law. The [employer] are provided "as  is" and "as available." [place] disclaims all representations and warranties, express, implied or statutory, not expressly set out in these terms, including the implied warranties of merchantability and fitness for a particular purpose. I hereby waive and release [place], its agents, employees, officers, directors, representatives, insurers, attorneys, assigns, successors, subsidiaries, and affiliates from any and all past, present, or future claims, demands, liabilities, actions, causes of action, or suits of any kind directly or indirectly arising from acceptance and use of the [employer]. I further waive and release [facility] and its affiliates from all present and future liability and responsibility for any injury or death to persons or damages to property caused by or related to the use of the [employer]. I have read this Waiver and Release of Liability, and I understand the terms used in it and their legal significance. This Waiver is freely and voluntarily given with the understanding that my right (as well as the right of any minor child for whom I am the parent or legal guardian using the [employer]) to legal recourse against [place] in connection with the [employer] is knowingly surrendered in return for use of these services.   I attest that I read the consent document to Ashley Simon, gave Ashley Simon the opportunity to ask questions and answered the questions asked (if any). I affirm that Ashley Simon then provided consent for she's participation in this program.     Ashley Simon

## 2020-11-07 DIAGNOSIS — S82141D Displaced bicondylar fracture of right tibia, subsequent encounter for closed fracture with routine healing: Secondary | ICD-10-CM | POA: Diagnosis not present

## 2020-11-07 DIAGNOSIS — M76821 Posterior tibial tendinitis, right leg: Secondary | ICD-10-CM | POA: Diagnosis not present

## 2020-11-07 DIAGNOSIS — S82111D Displaced fracture of right tibial spine, subsequent encounter for closed fracture with routine healing: Secondary | ICD-10-CM | POA: Diagnosis not present

## 2020-11-07 DIAGNOSIS — S82871N Displaced pilon fracture of right tibia, subsequent encounter for open fracture type IIIA, IIIB, or IIIC with nonunion: Secondary | ICD-10-CM | POA: Diagnosis not present

## 2020-11-26 ENCOUNTER — Other Ambulatory Visit (HOSPITAL_COMMUNITY)
Admission: RE | Admit: 2020-11-26 | Discharge: 2020-11-26 | Disposition: A | Discharge status: Home / Self Care | Payer: Medicaid Other | Source: Ambulatory Visit | Attending: Obstetrics & Gynecology | Admitting: Obstetrics & Gynecology

## 2020-11-26 ENCOUNTER — Other Ambulatory Visit: Payer: Self-pay

## 2020-11-26 ENCOUNTER — Other Ambulatory Visit (INDEPENDENT_AMBULATORY_CARE_PROVIDER_SITE_OTHER): Payer: Medicaid Other | Admitting: *Deleted

## 2020-11-26 DIAGNOSIS — N898 Other specified noninflammatory disorders of vagina: Secondary | ICD-10-CM

## 2020-11-26 NOTE — Progress Notes (Signed)
   NURSE VISIT- VAGINITIS/STD  SUBJECTIVE:  Ashley Simon is a 36 y.o. [id number] GYN patientfemale here for a vaginal swab for vaginitis screening, STD screen.  She reports the following symptoms:  vaginal irritation and itching  for several months. Denies abnormal vaginal bleeding, significant pelvic pain, fever, or UTI symptoms.  OBJECTIVE:  There were no vitals taken for this visit.  Appears well, in no apparent distress  ASSESSMENT: Vaginal swab for vaginitis screening & STD screen.  PLAN: Self-collected vaginal probe for Gonorrhea, Chlamydia, Trichomonas, Bacterial Vaginosis, Yeast sent to lab Treatment: to be determined once results are received Follow-up as needed if symptoms persist/worsen, or new symptoms develop  Malachy Mood  11/26/2020 11:15 AM

## 2020-11-26 NOTE — Progress Notes (Signed)
Chart reviewed for nurse visit. Agree with plan of care.  Adline Potter, NP 11/26/2020 12:26 PM

## 2020-11-27 ENCOUNTER — Encounter: Payer: Self-pay | Admitting: Adult Health

## 2020-11-27 ENCOUNTER — Other Ambulatory Visit: Payer: Self-pay | Admitting: Adult Health

## 2020-11-27 DIAGNOSIS — A599 Trichomoniasis, unspecified: Secondary | ICD-10-CM | POA: Insufficient documentation

## 2020-11-27 HISTORY — DX: Trichomoniasis, unspecified: A59.9

## 2020-11-27 LAB — CERVICOVAGINAL ANCILLARY ONLY
Bacterial Vaginitis (gardnerella): POSITIVE — AB
Candida Glabrata: NEGATIVE
Candida Vaginitis: POSITIVE — AB
Chlamydia: NEGATIVE
Comment: NEGATIVE
Comment: NEGATIVE
Comment: NEGATIVE
Comment: NEGATIVE
Comment: NEGATIVE
Comment: NORMAL
Neisseria Gonorrhea: NEGATIVE
Trichomonas: POSITIVE — AB

## 2020-11-27 MED ORDER — FLUCONAZOLE 150 MG PO TABS: ORAL_TABLET | ORAL | 1 refills | Status: DC

## 2020-11-27 MED ORDER — METRONIDAZOLE 500 MG PO TABS: 500.0000 mg | ORAL_TABLET | Freq: Two times a day (BID) | ORAL | 0 refills | Status: DC

## 2020-11-27 NOTE — Progress Notes (Signed)
Rx flagyl and diflucan

## 2021-01-04 DIAGNOSIS — S82871S Displaced pilon fracture of right tibia, sequela: Secondary | ICD-10-CM | POA: Diagnosis not present

## 2021-01-04 DIAGNOSIS — M25571 Pain in right ankle and joints of right foot: Secondary | ICD-10-CM | POA: Diagnosis not present

## 2021-01-04 DIAGNOSIS — S82831S Other fracture of upper and lower end of right fibula, sequela: Secondary | ICD-10-CM | POA: Diagnosis not present

## 2021-01-04 DIAGNOSIS — G8929 Other chronic pain: Secondary | ICD-10-CM | POA: Diagnosis not present

## 2021-01-04 DIAGNOSIS — M85871 Other specified disorders of bone density and structure, right ankle and foot: Secondary | ICD-10-CM | POA: Diagnosis not present

## 2021-01-04 DIAGNOSIS — G8911 Acute pain due to trauma: Secondary | ICD-10-CM | POA: Diagnosis not present

## 2021-01-04 DIAGNOSIS — S82141S Displaced bicondylar fracture of right tibia, sequela: Secondary | ICD-10-CM | POA: Diagnosis not present

## 2021-01-17 DIAGNOSIS — M85871 Other specified disorders of bone density and structure, right ankle and foot: Secondary | ICD-10-CM | POA: Diagnosis not present

## 2021-01-17 DIAGNOSIS — S82141A Displaced bicondylar fracture of right tibia, initial encounter for closed fracture: Secondary | ICD-10-CM | POA: Diagnosis not present

## 2021-01-17 DIAGNOSIS — Z4789 Encounter for other orthopedic aftercare: Secondary | ICD-10-CM | POA: Diagnosis not present

## 2021-01-17 DIAGNOSIS — Z9689 Presence of other specified functional implants: Secondary | ICD-10-CM | POA: Diagnosis not present

## 2021-01-17 DIAGNOSIS — S82141S Displaced bicondylar fracture of right tibia, sequela: Secondary | ICD-10-CM | POA: Diagnosis not present

## 2021-01-17 DIAGNOSIS — Z9889 Other specified postprocedural states: Secondary | ICD-10-CM | POA: Diagnosis not present

## 2021-01-17 DIAGNOSIS — S82871S Displaced pilon fracture of right tibia, sequela: Secondary | ICD-10-CM | POA: Diagnosis not present

## 2021-01-24 DIAGNOSIS — S92191K Other fracture of right talus, subsequent encounter for fracture with nonunion: Secondary | ICD-10-CM | POA: Diagnosis not present

## 2021-01-24 DIAGNOSIS — S82871K Displaced pilon fracture of right tibia, subsequent encounter for closed fracture with nonunion: Secondary | ICD-10-CM | POA: Diagnosis not present

## 2021-01-24 DIAGNOSIS — S82871S Displaced pilon fracture of right tibia, sequela: Secondary | ICD-10-CM | POA: Diagnosis not present

## 2021-01-24 DIAGNOSIS — Z9889 Other specified postprocedural states: Secondary | ICD-10-CM | POA: Diagnosis not present

## 2021-03-12 DIAGNOSIS — M19171 Post-traumatic osteoarthritis, right ankle and foot: Secondary | ICD-10-CM | POA: Diagnosis not present

## 2021-03-12 DIAGNOSIS — S82891F Other fracture of right lower leg, subsequent encounter for open fracture type IIIA, IIIB, or IIIC with routine healing: Secondary | ICD-10-CM | POA: Diagnosis not present

## 2021-03-12 DIAGNOSIS — S82871F Displaced pilon fracture of right tibia, subsequent encounter for open fracture type IIIA, IIIB, or IIIC with routine healing: Secondary | ICD-10-CM | POA: Diagnosis not present

## 2021-03-12 DIAGNOSIS — R200 Anesthesia of skin: Secondary | ICD-10-CM | POA: Diagnosis not present

## 2021-03-12 DIAGNOSIS — S82141D Displaced bicondylar fracture of right tibia, subsequent encounter for closed fracture with routine healing: Secondary | ICD-10-CM | POA: Diagnosis not present

## 2021-03-12 DIAGNOSIS — M12571 Traumatic arthropathy, right ankle and foot: Secondary | ICD-10-CM | POA: Diagnosis not present

## 2021-03-12 DIAGNOSIS — M21861 Other specified acquired deformities of right lower leg: Secondary | ICD-10-CM | POA: Diagnosis not present

## 2021-03-19 IMAGING — DX DG ANKLE COMPLETE 3+V*R*
3 series · 3 of 3 positions shown · non-contrast
Comparison: None.

CLINICAL DATA: Distal tibial fracture.

EXAM:
RIGHT ANKLE - COMPLETE 3+ VIEW

[ankle ap]
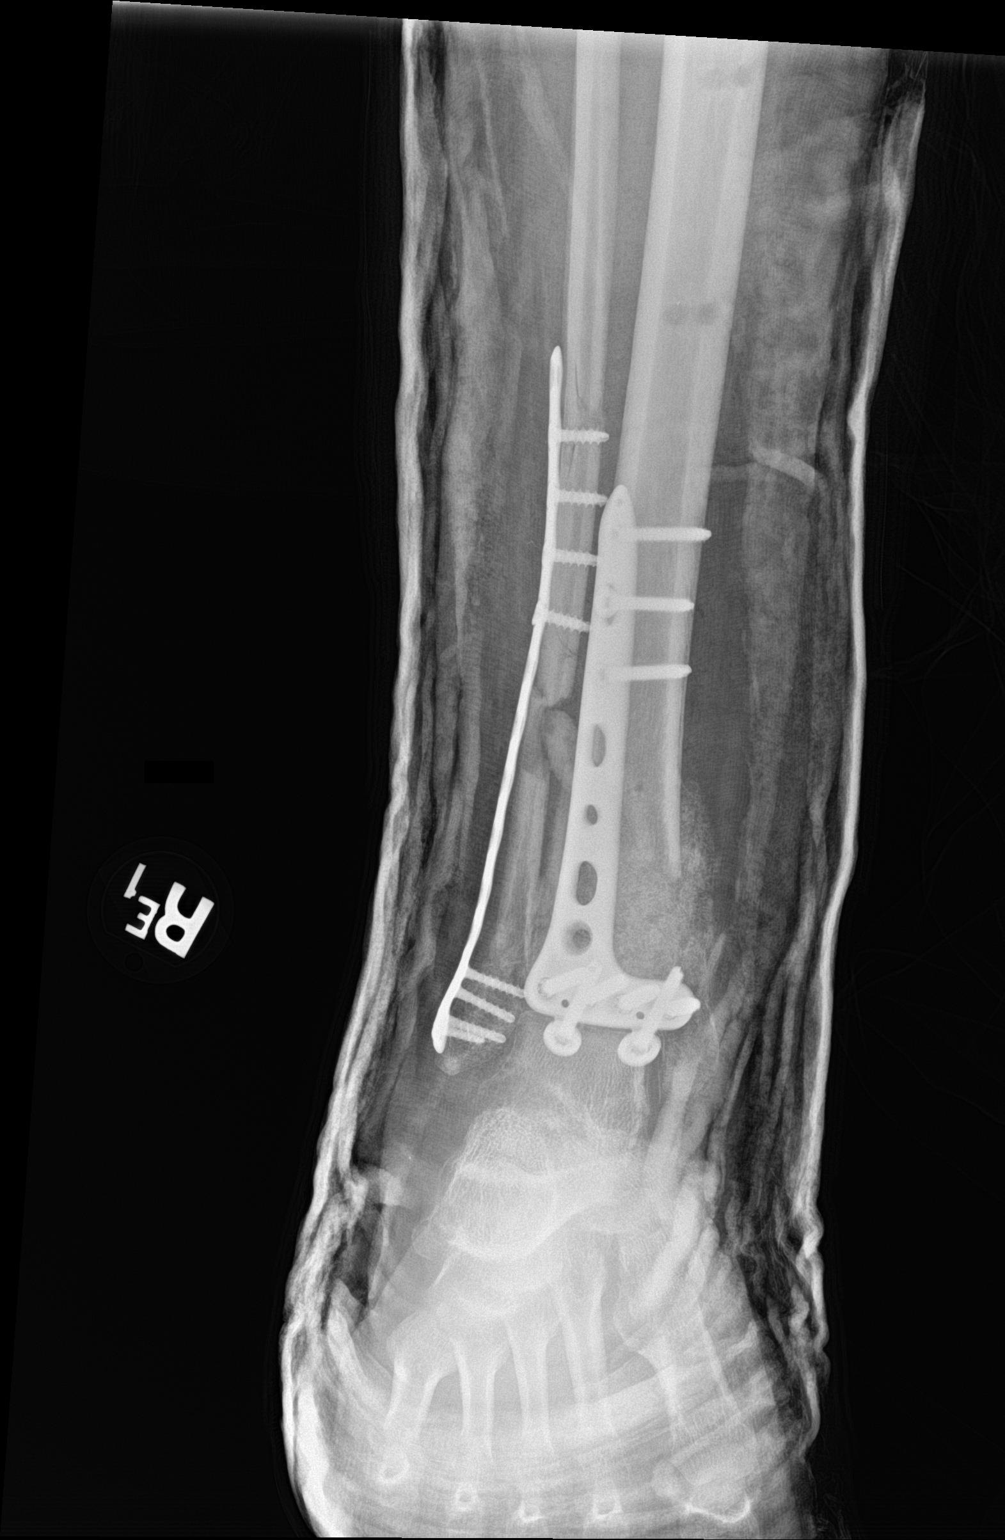

[ankle obl]
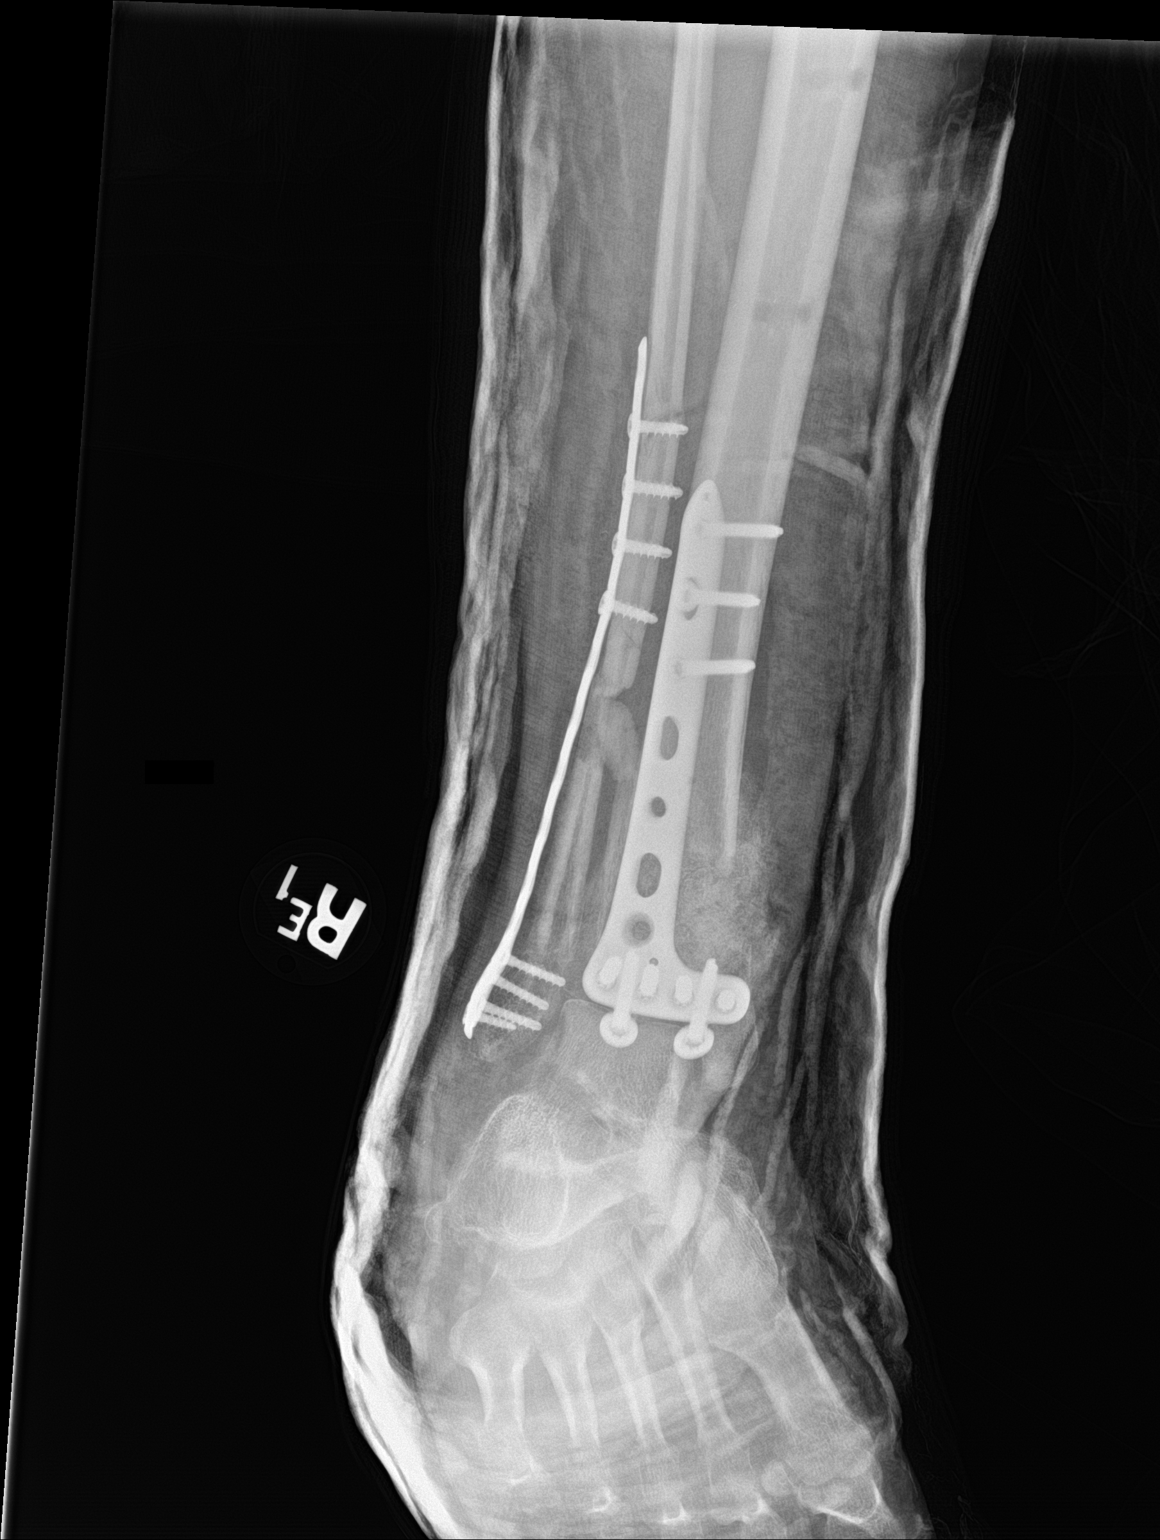

[ankle lat]
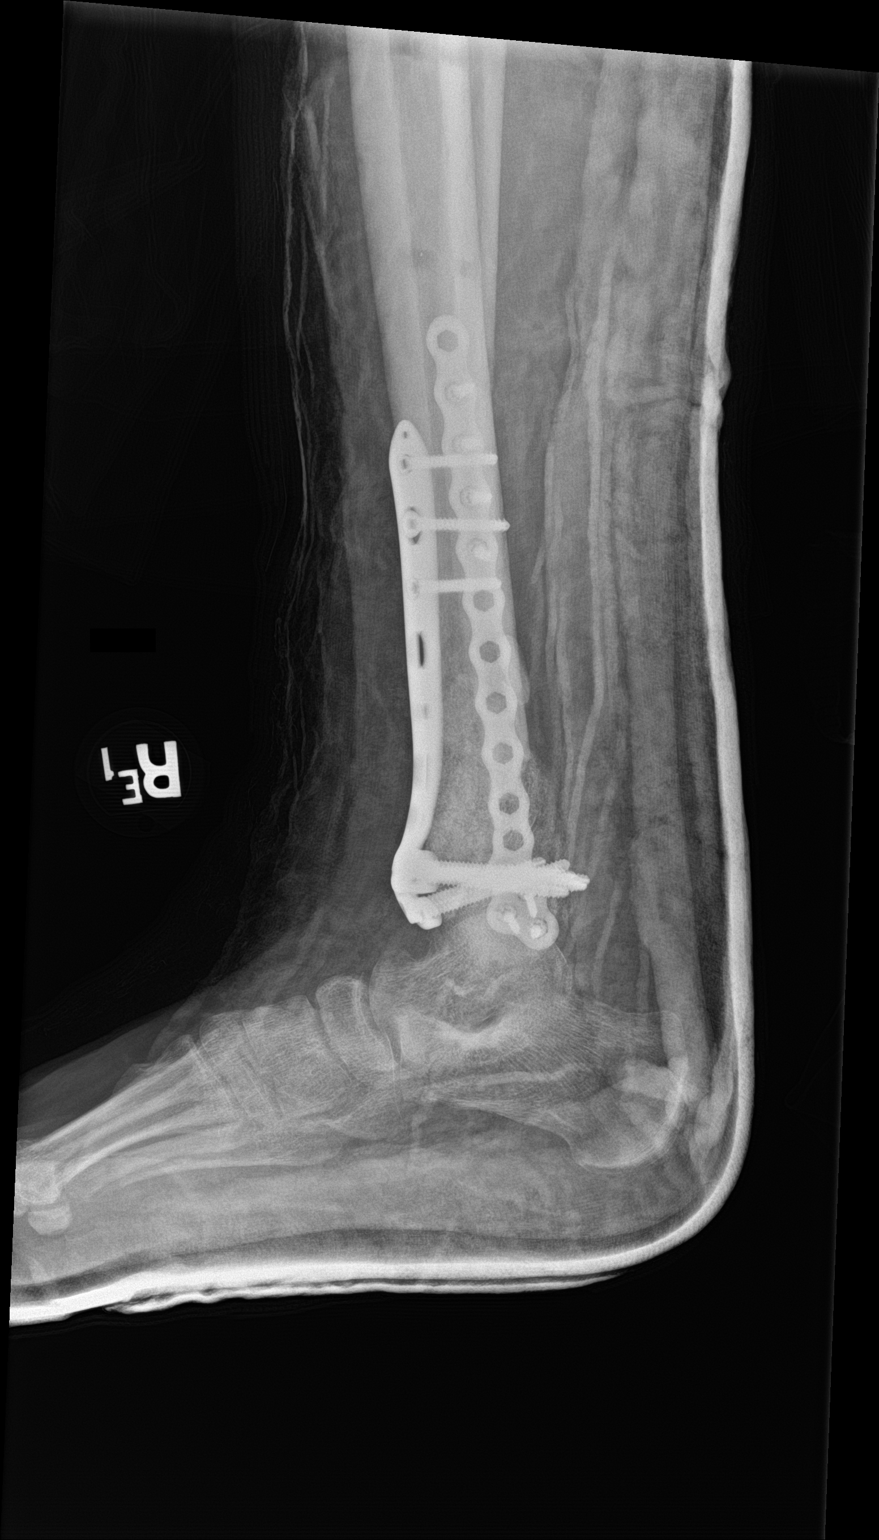

[3 of 3 positions shown; findings below may reference images not displayed]

FINDINGS: Status post surgical internal fixation of distal right tibial and
fibular fractures. The right ankle has been casted and immobilized.
IMPRESSION: Status post surgical internal fixation of distal right tibial and
fibular fractures.

## 2021-05-03 DIAGNOSIS — M19071 Primary osteoarthritis, right ankle and foot: Secondary | ICD-10-CM | POA: Diagnosis not present

## 2021-05-03 DIAGNOSIS — M21861 Other specified acquired deformities of right lower leg: Secondary | ICD-10-CM | POA: Diagnosis not present

## 2021-05-03 DIAGNOSIS — G8918 Other acute postprocedural pain: Secondary | ICD-10-CM | POA: Diagnosis not present

## 2021-12-19 ENCOUNTER — Ambulatory Visit: Payer: Medicaid Other

## 2022-02-10 ENCOUNTER — Ambulatory Visit: Payer: Medicaid Other | Admitting: Advanced Practice Midwife

## 2022-02-10 ENCOUNTER — Encounter: Payer: Self-pay | Admitting: Advanced Practice Midwife

## 2022-02-10 DIAGNOSIS — N760 Acute vaginitis: Secondary | ICD-10-CM

## 2022-02-10 DIAGNOSIS — Z113 Encounter for screening for infections with a predominantly sexual mode of transmission: Secondary | ICD-10-CM | POA: Diagnosis not present

## 2022-02-10 DIAGNOSIS — T7411XA Adult physical abuse, confirmed, initial encounter: Secondary | ICD-10-CM | POA: Insufficient documentation

## 2022-02-10 DIAGNOSIS — T7411XS Adult physical abuse, confirmed, sequela: Secondary | ICD-10-CM

## 2022-02-10 DIAGNOSIS — Z62810 Personal history of physical and sexual abuse in childhood: Secondary | ICD-10-CM | POA: Insufficient documentation

## 2022-02-10 LAB — WET PREP FOR TRICH, YEAST, CLUE
Trichomonas Exam: NEGATIVE
Yeast Exam: NEGATIVE

## 2022-02-10 MED ORDER — METRONIDAZOLE 500 MG PO TABS: 500.0000 mg | ORAL_TABLET | Freq: Two times a day (BID) | ORAL | 0 refills | Status: AC

## 2022-02-10 NOTE — Addendum Note (Signed)
Addended by: Jenetta Downer on: 02/10/2022 12:59 PM   Modules accepted: Orders

## 2022-02-10 NOTE — Progress Notes (Signed)
Wet prep reviewed with provider and patient treated for BV per SO.Marland KitchenJenetta Downer, RN

## 2022-02-10 NOTE — Progress Notes (Signed)
Patient here for STD screening. States no current partner, does not want birth control.Jenetta Downer, RN
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
## 2022-02-11 ENCOUNTER — Ambulatory Visit: Payer: Medicaid Other | Admitting: Advanced Practice Midwife

## 2022-02-11 ENCOUNTER — Ambulatory Visit: Payer: Medicaid Other

## 2022-02-11 DIAGNOSIS — Z113 Encounter for screening for infections with a predominantly sexual mode of transmission: Secondary | ICD-10-CM | POA: Diagnosis not present

## 2022-02-11 DIAGNOSIS — N760 Acute vaginitis: Secondary | ICD-10-CM

## 2022-02-11 MED ORDER — METRONIDAZOLE 0.75 % VA GEL: 1.0000 | Freq: Every day | VAGINAL | Status: DC

## 2022-02-11 MED ORDER — METRONIDAZOLE 0.75 % EX GEL: 1.0000 | Freq: Every day | CUTANEOUS | 0 refills | Status: AC

## 2022-02-11 NOTE — Progress Notes (Signed)
Pt walked into clinic because was dx'd with BV yesterday here by this provider and given Flagyl for tx. Pt states she took 1 pill and vomited it up and doesn't want these pills. Counseled on option of taking with food vs Metrogel which is not first line tx recommendation. Pt insists on Metrogel--given to pt with instructions.

## 2022-02-11 NOTE — Progress Notes (Addendum)
Patient walked in to clinic stating she was not able to tolerate po Metronidazole. Appointment made and dispensed Metronidazole Vaginal Cream today;  provided counseling today regarding the medication; discussed the medication, the side effects and when to call clinic. Patient given the opportunity to ask questions. Questions answered. BThiele RN

## 2022-02-20 ENCOUNTER — Ambulatory Visit: Payer: Medicaid Other | Admitting: Licensed Clinical Social Worker
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
## 2022-04-14 ENCOUNTER — Emergency Department
Admission: EM | Admit: 2022-04-14 | Discharge: 2022-04-14 | Disposition: A | Discharge status: Home / Self Care | Payer: Medicaid Other | Attending: Student in an Organized Health Care Education/Training Program | Admitting: Student in an Organized Health Care Education/Training Program

## 2022-04-14 ENCOUNTER — Emergency Department: Payer: Medicaid Other

## 2022-04-14 ENCOUNTER — Encounter: Payer: Self-pay | Admitting: Emergency Medicine

## 2022-04-14 ENCOUNTER — Other Ambulatory Visit: Payer: Self-pay

## 2022-04-14 DIAGNOSIS — J45909 Unspecified asthma, uncomplicated: Secondary | ICD-10-CM | POA: Insufficient documentation

## 2022-04-14 DIAGNOSIS — J101 Influenza due to other identified influenza virus with other respiratory manifestations: Secondary | ICD-10-CM | POA: Diagnosis not present

## 2022-04-14 DIAGNOSIS — R0981 Nasal congestion: Secondary | ICD-10-CM | POA: Diagnosis present

## 2022-04-14 DIAGNOSIS — Z1152 Encounter for screening for COVID-19: Secondary | ICD-10-CM | POA: Insufficient documentation

## 2022-04-14 LAB — RESP PANEL BY RT-PCR (RSV, FLU A&B, COVID)  RVPGX2
Influenza A by PCR: POSITIVE — AB
Influenza B by PCR: NEGATIVE
Resp Syncytial Virus by PCR: NEGATIVE
SARS Coronavirus 2 by RT PCR: NEGATIVE

## 2022-04-14 LAB — GROUP A STREP BY PCR: Group A Strep by PCR: NOT DETECTED

## 2022-04-14 MED ORDER — PREDNISONE 20 MG PO TABS: 40.0000 mg | ORAL_TABLET | Freq: Every day | ORAL | 0 refills | Status: AC

## 2022-04-14 MED ORDER — PREDNISONE 20 MG PO TABS
60.0000 mg | ORAL_TABLET | Freq: Once | ORAL | Status: AC
  Administered 2022-04-14: 60 mg via ORAL
  Filled 2022-04-14: qty 3

## 2022-04-14 MED ORDER — IPRATROPIUM-ALBUTEROL 0.5-2.5 (3) MG/3ML IN SOLN
3.0000 mL | Freq: Once | RESPIRATORY_TRACT | Status: AC
  Administered 2022-04-14: 3 mL via RESPIRATORY_TRACT
  Filled 2022-04-14: qty 3

## 2022-04-14 MED ORDER — PREDNISONE 20 MG PO TABS: 40.0000 mg | ORAL_TABLET | Freq: Every day | ORAL | 0 refills | Status: DC

## 2022-04-14 MED ORDER — ALBUTEROL SULFATE HFA 108 (90 BASE) MCG/ACT IN AERS: 2.0000 | INHALATION_SPRAY | Freq: Four times a day (QID) | RESPIRATORY_TRACT | 2 refills | Status: AC | PRN

## 2022-04-14 MED ORDER — IBUPROFEN 600 MG PO TABS
600.0000 mg | ORAL_TABLET | Freq: Once | ORAL | Status: AC
  Administered 2022-04-14: 600 mg via ORAL
  Filled 2022-04-14: qty 1

## 2022-04-14 MED ORDER — ALBUTEROL SULFATE HFA 108 (90 BASE) MCG/ACT IN AERS: 2.0000 | INHALATION_SPRAY | Freq: Four times a day (QID) | RESPIRATORY_TRACT | 2 refills | Status: DC | PRN

## 2022-04-14 NOTE — ED Provider Notes (Signed)
[facility] Provider Note    Event Date/Time   First MD Initiated Contact with Patient 04/14/22 1426     (approximate)   History   Nasal Congestion   HPI  Ashley Simon is a 36 y.o. female with a history of asthma presents to the ER for evaluation of flulike illness for the past 3 days.  Complaining of headache congestion and cough fevers.  Has taken some inhalers without much improvement in her cough.  Mostly had asthma as a child.  Does feel like she is wheezing.  No nausea or vomiting.     Physical Exam   Triage Vital Signs: ED Triage Vitals  Enc Vitals Group     BP 04/14/22 1151 (!) 105/91     Pulse Rate 04/14/22 1151 99     Resp 04/14/22 1151 20     Temp 04/14/22 1151 (!) 100.4 F (38 C)     Temp Source 04/14/22 1151 Oral     SpO2 04/14/22 1151 100 %     Weight 04/14/22 1152 135 lb (61.2 kg)     Height 04/14/22 1152 5\' 3"  (1.6 m)     Head Circumference --      Peak Flow --      Pain Score 04/14/22 1152 10     Pain Loc --      Pain Edu? --      Excl. in GC? --     Most recent vital signs: Vitals:   04/14/22 1151 04/14/22 1448  BP: (!) 105/91   Pulse: 99   Resp: 20   Temp: (!) 100.4 F (38 C) (!) 100.7 F (38.2 C)  SpO2: 100%      Constitutional: Alert  Eyes: Conjunctivae are normal.  Head: Atraumatic. Nose: No congestion/rhinnorhea. Mouth/Throat: Mucous membranes are moist.   Neck: Painless ROM.  Cardiovascular:   Good peripheral circulation. No m/g/r Respiratory: Normal respiratory effort.  Scattered wheeze throughout no respiratory distress. Gastrointestinal: Soft and nontender.  Musculoskeletal:  no deformity Neurologic:  MAE spontaneously. No gross focal neurologic deficits are appreciated.  Skin:  Skin is warm, dry and intact. No rash noted. Psychiatric: Mood and affect are normal. Speech and behavior are normal.    ED Results / Procedures / Treatments   Labs (all labs ordered are listed, but only  abnormal results are displayed) Labs Reviewed  RESP PANEL BY RT-PCR (RSV, FLU A&B, COVID)  [id number] - Abnormal; Notable for the following components:      Result Value   Influenza A by PCR POSITIVE (*)    All other components within normal limits  GROUP A STREP BY PCR     EKG     RADIOLOGY Please see ED Course for my review and interpretation.  I personally reviewed all radiographic images ordered to evaluate for the above acute complaints and reviewed radiology reports and findings.  These findings were personally discussed with the patient.  Please see medical record for radiology report.    PROCEDURES:  Critical Care performed: No  Procedures   MEDICATIONS ORDERED IN ED: Medications  predniSONE (DELTASONE) tablet 60 mg (60 mg Oral Given 04/14/22 1500)  ibuprofen (ADVIL) tablet 600 mg (600 mg Oral Given 04/14/22 1500)  ipratropium-albuterol (DUONEB) 0.5-2.5 (3) MG/3ML nebulizer solution 3 mL (3 mLs Nebulization Given 04/14/22 1618)     IMPRESSION / MDM / ASSESSMENT AND PLAN / ED COURSE  I reviewed the triage vital signs and the nursing notes.  Differential diagnosis includes, but is not limited to, flu, coid, uri,, asthma, pna  Patient presented to the ER for evaluation of symptoms as described above.  Patient not in respiratory distress she is febrile but well-perfused.  Her presentation is consistent with influenza.  Chest x-ray will be ordered given her productive cough.  Will be given Motrin for fever and symptomatic management will give nebulizer as well as prednisone as she does have some wheezing on exam.  No hypoxia.    Clinical Course as of 04/14/22 1630  Mon Apr 14, 2022  1520 Chest x-ray my review and interpretation does not show any evidence of consolidation or infiltrate. [PR]  1551 Patient reassessed.  Feeling improved.  This point does appear clinically stable and appropriate for trial of outpatient management.  Unlikely to  benefit from Tamiflu given onset of symptoms being greater than 3 days ago.  Discussed supportive care.  Patient agreeable plan. [PR]    Clinical Course User Index [PR] Willy Eddy, MD     FINAL CLINICAL IMPRESSION(S) / ED DIAGNOSES   Final diagnoses:  Influenza A     Rx / DC Orders   ED Discharge Orders          Ordered    predniSONE (DELTASONE) 20 MG tablet  Daily        04/14/22 1607    albuterol (VENTOLIN HFA) 108 (90 Base) MCG/ACT inhaler  Every 6 hours PRN        04/14/22 1608             Note:  This document was prepared using Dragon voice recognition software and may include unintentional dictation errors.    Willy Eddy, MD 04/14/22 1630

## 2022-04-14 NOTE — ED Triage Notes (Signed)
Pt states coming in for cough, congestion. Pt states coughing up blood and mucus. Pt states history of asthma and taking nebulizer treatments and albuterol, prior to coming. Pt states no asthma exacerbation concern right now. Pt also states headache. Pt states symptoms for 3 days

## 2022-04-14 NOTE — ED Notes (Signed)
Pt states sore throat, and burning in her throat and chest. Pt states chest pain. Provider in triage notified, but stated no EKG needed at this time

## 2022-04-14 NOTE — ED Provider Triage Note (Signed)
Emergency Medicine Provider Triage Evaluation Note  Ashley Simon , a 36 y.o. female  was evaluated in triage.  Pt complains of cough, fever, sore throat. Symptoms started a couple of weeks ago, but worse this morning.  Physical Exam  BP (!) 105/91   Pulse 99   Temp (!) 100.4 F (38 C) (Oral)   Resp 20   Ht 5\' 3"  (1.6 m)   Wt 61.2 kg   SpO2 100%   BMI 23.91 kg/m  Gen:   Awake, no distress   Resp:  Normal effort  MSK:   Moves extremities without difficulty  Other:    Medical Decision Making  Medically screening exam initiated at 11:53 AM.  Appropriate orders placed.  Ysabelle was informed that the remainder of the evaluation will be completed by another provider, this initial triage assessment does not replace that evaluation, and the importance of remaining in the ED until their evaluation is complete.    Tera Mater, FNP 04/14/22 1154

## 2022-08-06 ENCOUNTER — Emergency Department: Payer: Medicaid Other

## 2022-08-06 ENCOUNTER — Emergency Department
Admission: EM | Admit: 2022-08-06 | Discharge: 2022-08-06 | Disposition: A | Discharge status: Home / Self Care | Payer: Medicaid Other | Attending: Emergency Medicine | Admitting: Emergency Medicine

## 2022-08-06 ENCOUNTER — Other Ambulatory Visit: Payer: Self-pay

## 2022-08-06 DIAGNOSIS — S90931A Unspecified superficial injury of right great toe, initial encounter: Secondary | ICD-10-CM | POA: Diagnosis present

## 2022-08-06 DIAGNOSIS — Z8616 Personal history of COVID-19: Secondary | ICD-10-CM | POA: Diagnosis not present

## 2022-08-06 DIAGNOSIS — S92324A Nondisplaced fracture of second metatarsal bone, right foot, initial encounter for closed fracture: Secondary | ICD-10-CM | POA: Insufficient documentation

## 2022-08-06 DIAGNOSIS — J45909 Unspecified asthma, uncomplicated: Secondary | ICD-10-CM | POA: Insufficient documentation

## 2022-08-06 DIAGNOSIS — S92331A Displaced fracture of third metatarsal bone, right foot, initial encounter for closed fracture: Secondary | ICD-10-CM | POA: Insufficient documentation

## 2022-08-06 DIAGNOSIS — S92321A Displaced fracture of second metatarsal bone, right foot, initial encounter for closed fracture: Secondary | ICD-10-CM

## 2022-08-06 MED ORDER — OXYCODONE HCL 5 MG PO TABS: 5.0000 mg | ORAL_TABLET | Freq: Three times a day (TID) | ORAL | 0 refills | Status: AC | PRN

## 2022-08-06 MED ORDER — OXYCODONE-ACETAMINOPHEN 5-325 MG PO TABS
1.0000 | ORAL_TABLET | ORAL | Status: DC | PRN
  Administered 2022-08-06: 1 via ORAL
  Filled 2022-08-06: qty 1

## 2022-08-06 MED ORDER — KETOROLAC TROMETHAMINE 15 MG/ML IJ SOLN
15.0000 mg | Freq: Once | INTRAMUSCULAR | Status: AC
  Administered 2022-08-06: 15 mg via INTRAMUSCULAR
  Filled 2022-08-06: qty 1
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
## 2022-08-06 NOTE — ED Notes (Signed)
Patient refused offer of ice
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
## 2022-08-06 NOTE — Discharge Instructions (Addendum)
It appears that you have broken 2 bones in your foot.  Please wear the cam boot at all times and use the crutches so that you do not bear weight on your affected extremity.  I understand that you would like to follow-up with your own podiatrist, please ensure that you have an appointment within the next 2 weeks.  In the meantime rest, ice, elevate your foot.  If you are unable to see your podiatrist within the next 2 weeks, please ensure that you follow-up with our podiatrist.  He is already aware of you and your injury.  You may take the medication as prescribed, however, please remember that oxycodone is highly addictive and should only be used for breakthrough pain when Tylenol or ibuprofen/naproxen does not work first.  Also remember that you cannot drive, operate heavy machinery, or perform any test that require concentration while taking the oxycodone.  Please return for any new, worsening, or change in symptoms or other concerns.  It was a pleasure caring for you today.

## 2022-12-26 ENCOUNTER — Emergency Department
Admission: EM | Admit: 2022-12-26 | Discharge: 2022-12-27 | Disposition: A | Discharge status: Home / Self Care | Payer: Medicaid Other | Attending: Emergency Medicine | Admitting: Emergency Medicine

## 2022-12-26 ENCOUNTER — Other Ambulatory Visit: Payer: Self-pay

## 2022-12-26 ENCOUNTER — Emergency Department: Payer: Medicaid Other

## 2022-12-26 DIAGNOSIS — R1030 Lower abdominal pain, unspecified: Secondary | ICD-10-CM | POA: Diagnosis present

## 2022-12-26 DIAGNOSIS — D219 Benign neoplasm of connective and other soft tissue, unspecified: Secondary | ICD-10-CM

## 2022-12-26 DIAGNOSIS — D259 Leiomyoma of uterus, unspecified: Secondary | ICD-10-CM | POA: Insufficient documentation

## 2022-12-26 DIAGNOSIS — N939 Abnormal uterine and vaginal bleeding, unspecified: Secondary | ICD-10-CM | POA: Diagnosis not present

## 2022-12-26 LAB — URINALYSIS, ROUTINE W REFLEX MICROSCOPIC
Bacteria, UA: NONE SEEN
Bilirubin Urine: NEGATIVE
Glucose, UA: NEGATIVE mg/dL
Ketones, ur: NEGATIVE mg/dL
Leukocytes,Ua: NEGATIVE
Nitrite: NEGATIVE
Protein, ur: NEGATIVE mg/dL
Specific Gravity, Urine: 1.026 (ref 1.005–1.030)
pH: 5 (ref 5.0–8.0)

## 2022-12-26 LAB — CHLAMYDIA/NGC RT PCR (ARMC ONLY)
Chlamydia Tr: NOT DETECTED
N gonorrhoeae: NOT DETECTED

## 2022-12-26 LAB — WET PREP, GENITAL
Sperm: NONE SEEN
Trich, Wet Prep: NONE SEEN
WBC, Wet Prep HPF POC: <10 (ref <10)

## 2022-12-26 LAB — CBC
HCT: 41.3 % (ref 36.0–46.0)
Hemoglobin: 13.3 g/dL (ref 12.0–15.0)
MCH: 30 pg (ref 26.0–34.0)
MCHC: 32.2 g/dL (ref 30.0–36.0)
MCV: 93.2 fL (ref 80.0–100.0)
Platelets: 251 10*3/uL (ref 150–400)
RBC: 4.43 MIL/uL (ref 3.87–5.11)
RDW: 15.1 % (ref 11.5–15.5)
WBC: 7.1 10*3/uL (ref 4.0–10.5)
nRBC: 0 % (ref 0.0–0.2)

## 2022-12-26 LAB — BASIC METABOLIC PANEL
Anion gap: 8 (ref 5–15)
BUN: 13 mg/dL (ref 6–20)
CO2: 27 mmol/L (ref 22–32)
Calcium: 9.4 mg/dL (ref 8.9–10.3)
Chloride: 104 mmol/L (ref 98–111)
Creatinine, Ser: 0.79 mg/dL (ref 0.44–1.00)
GFR, Estimated: >60 mL/min (ref >60)
Glucose, Bld: 137 mg/dL — ABNORMAL HIGH (ref 70–99)
Potassium: 3.7 mmol/L (ref 3.5–5.1)
Sodium: 139 mmol/L (ref 135–145)

## 2022-12-26 LAB — HCG, QUANTITATIVE, PREGNANCY: hCG, Beta Chain, Quant, S: <1 m[IU]/mL (ref <5)

## 2022-12-26 MED ORDER — OXYCODONE-ACETAMINOPHEN 5-325 MG PO TABS
2.0000 | ORAL_TABLET | Freq: Once | ORAL | Status: AC
  Administered 2022-12-26: 2 via ORAL
  Filled 2022-12-26: qty 2

## 2022-12-26 MED ORDER — IBUPROFEN 600 MG PO TABS: 600.0000 mg | ORAL_TABLET | Freq: Three times a day (TID) | ORAL | 0 refills | Status: DC | PRN

## 2022-12-26 MED ORDER — KETOROLAC TROMETHAMINE 60 MG/2ML IM SOLN
60.0000 mg | Freq: Once | INTRAMUSCULAR | Status: AC
  Administered 2022-12-26: 60 mg via INTRAMUSCULAR
  Filled 2022-12-26: qty 2

## 2022-12-26 MED ORDER — MEDROXYPROGESTERONE ACETATE 10 MG PO TABS: 10.0000 mg | ORAL_TABLET | Freq: Every day | ORAL | 0 refills | Status: DC

## 2022-12-26 MED ORDER — ONDANSETRON 4 MG PO TBDP
4.0000 mg | ORAL_TABLET | Freq: Once | ORAL | Status: AC
  Administered 2022-12-26: 4 mg via ORAL
  Filled 2022-12-26: qty 1

## 2022-12-26 NOTE — ED Triage Notes (Signed)
Pt presents to the ED POV from home. Reports vaginal bleeding that started 3 weeks ago, stopped yesterday, and started back today. Pt reports stomach cramping and breast pain.

## 2022-12-26 NOTE — ED Provider Notes (Signed)
[facility] Provider Note    Event Date/Time   First MD Initiated Contact with Patient 12/26/22 2041     (approximate)   History   Vaginal Bleeding   HPI  Ashley Simon is a 37 y.o. female  here with vaginal bleeding and pain. Pt reports that over the past several weeks she has had constant aching, throbbing, lower abdominal pain with constant vaginal bleeding. It started with what seemed like a normal period, but has persisted and been intermittently heavy and light. No h/o similar episodes. She has had associated lower abdominal cramping and feelings of contractions. She is not sexually active for >1 year, denies any concern for STIs. She does state she has fairly chronic vaginal discharge. No fevers, chills.      Physical Exam   Triage Vital Signs: ED Triage Vitals  Encounter Vitals Group     BP 12/26/22 1750 105/72     Systolic BP Percentile --      Diastolic BP Percentile --      Pulse Rate 12/26/22 1750 68     Resp 12/26/22 1750 18     Temp 12/26/22 1750 98.3 F (36.8 C)     Temp Source 12/26/22 1750 Oral     SpO2 12/26/22 1750 99 %     Weight 12/26/22 1753 160 lb (72.6 kg)     Height 12/26/22 1753 5\' 3"  (1.6 m)     Head Circumference --      Peak Flow --      Pain Score 12/26/22 1753 7     Pain Loc --      Pain Education --      Exclude from Growth Chart --     Most recent vital signs: Vitals:   12/26/22 2300 12/26/22 2332  BP: 95/68   Pulse: 63   Resp:  18  Temp:    SpO2: 100%      General: Awake, no distress.  CV:  Good peripheral perfusion. RRR. Resp:  Normal work of breathing.  Abd:  No distention. Minimal suprapubic TTP. No rebound or guarding. No CVAT. Other:  MMM.   ED Results / Procedures / Treatments   Labs (all labs ordered are listed, but only abnormal results are displayed) Labs Reviewed  WET PREP, GENITAL - Abnormal; Notable for the following components:      Result Value   Yeast Wet Prep HPF  POC PRESENT (*)    Clue Cells Wet Prep HPF POC PRESENT (*)    All other components within normal limits  BASIC METABOLIC PANEL - Abnormal; Notable for the following components:   Glucose, Bld 137 (*)    All other components within normal limits  URINALYSIS, ROUTINE W REFLEX MICROSCOPIC - Abnormal; Notable for the following components:   Color, Urine YELLOW (*)    APPearance CLEAR (*)    Hgb urine dipstick MODERATE (*)    All other components within normal limits  CHLAMYDIA/NGC RT PCR ([facility] ONLY)            CBC  HCG, QUANTITATIVE, PREGNANCY  HIV ANTIBODY (ROUTINE TESTING W REFLEX)  POC URINE PREG, ED     EKG    RADIOLOGY [place]: Small fibroid   I also independently reviewed and agree with radiologist interpretations.   PROCEDURES:  Critical Care performed: No    MEDICATIONS ORDERED IN ED: Medications  ketorolac (TORADOL) injection 60 mg (60 mg Intramuscular Given 12/26/22 2147)  oxyCODONE-acetaminophen (PERCOCET/ROXICET) 5-325 MG per tablet  2 tablet (2 tablets Oral Given 12/26/22 2146)  ondansetron (ZOFRAN-ODT) disintegrating tablet 4 mg (4 mg Oral Given 12/26/22 2146)     IMPRESSION / MDM / ASSESSMENT AND PLAN / ED COURSE  I reviewed the triage vital signs and the nursing notes.                              Differential diagnosis includes, but is not limited to, DUB, fibroid, BV, yeast infection, unlikely PID, FB  Patient's presentation is most consistent with acute presentation with potential threat to life or bodily function.  37 yo F here with lower abdominal pain and vaginal bleeding. Pregnancy is negative. Denies any concern for STIs and has no fever or signs of PID. Imaging shows posterior fibroid and clinically, suspect fibroid-related or anovulatory bleeding. BMP unremarkable. Hgb is normal. UA without signs of UTI. Wet prep + yeast, clue cells. Neg gc/c. Will tx for BV, yeast, and fibroid-related bleeding and pain, with brief course of provera given question of  anovulatory bleeding. Pt will f/u with PCP.Marland Kitchen      FINAL CLINICAL IMPRESSION(S) / ED DIAGNOSES   Final diagnoses:  Vaginal bleeding  Fibroid     Rx / DC Orders       Note:  This document was prepared using Dragon voice recognition software and may include unintentional dictation errors.   Shaune Pollack, MD 12/27/22 [phone]

## 2022-12-26 NOTE — ED Provider Triage Note (Signed)
Emergency Medicine Provider Triage Evaluation Note  Ashley Simon , a 37 y.o. female  was evaluated in triage.  Pt complains of vaginal bleeding x 3 weeks  Review of Systems  Positive: Vaginal bleeding Negative: Dizziness  Physical Exam  BP 105/72   Pulse 68   Temp 98.3 F (36.8 C) (Oral)   Resp 18   Ht 1.6 m (5\' 3" )   Wt 72.6 kg   LMP 12/26/2022 (Exact Date)   SpO2 99%   BMI 28.34 kg/m  Gen:   Awake, no distress   Resp:  Normal effort  MSK:   Moves extremities without difficulty  Other:    Medical Decision Making  Medically screening exam initiated at 7:48 PM.  Appropriate orders placed.  Trenna Tera Mater was informed that the remainder of the evaluation will be completed by another provider, this initial triage assessment does not replace that evaluation, and the importance of remaining in the ED until their evaluation is complete.     Jene Every, MD 12/26/22 2019

## 2022-12-27 ENCOUNTER — Telehealth: Payer: Self-pay | Admitting: Emergency Medicine

## 2022-12-27 LAB — HIV ANTIBODY (ROUTINE TESTING W REFLEX): HIV Screen 4th Generation wRfx: NONREACTIVE

## 2022-12-27 MED ORDER — IBUPROFEN 600 MG PO TABS: 600.0000 mg | ORAL_TABLET | Freq: Three times a day (TID) | ORAL | 0 refills | Status: AC | PRN

## 2022-12-27 MED ORDER — MEDROXYPROGESTERONE ACETATE 10 MG PO TABS: 10.0000 mg | ORAL_TABLET | Freq: Every day | ORAL | 0 refills | Status: AC

## 2022-12-27 MED ORDER — METRONIDAZOLE 500 MG PO TABS: 500.0000 mg | ORAL_TABLET | Freq: Two times a day (BID) | ORAL | 0 refills | Status: AC

## 2022-12-27 MED ORDER — FLUCONAZOLE 150 MG PO TABS: 150.0000 mg | ORAL_TABLET | Freq: Once | ORAL | 0 refills | Status: AC

## 2022-12-27 NOTE — Telephone Encounter (Signed)
Meds resent to [facility]. See ED documentation from visit today.

## 2022-12-29 NOTE — Group Note (Deleted)

## 2023-10-01 ENCOUNTER — Emergency Department
Admission: EM | Admit: 2023-10-01 | Discharge: 2023-10-01 | Disposition: A | Discharge status: Home / Self Care | Attending: Emergency Medicine | Admitting: Emergency Medicine

## 2023-10-01 ENCOUNTER — Other Ambulatory Visit: Payer: Self-pay

## 2023-10-01 DIAGNOSIS — M79602 Pain in left arm: Secondary | ICD-10-CM | POA: Insufficient documentation

## 2023-10-01 MED ORDER — CYCLOBENZAPRINE HCL 5 MG PO TABS: 5.0000 mg | ORAL_TABLET | Freq: Three times a day (TID) | ORAL | 0 refills | Status: AC | PRN

## 2023-10-01 MED ORDER — ACETAMINOPHEN 325 MG PO TABS
650.0000 mg | ORAL_TABLET | Freq: Once | ORAL | Status: AC
  Administered 2023-10-01: 650 mg via ORAL
  Filled 2023-10-01: qty 2

## 2023-10-01 MED ORDER — CYCLOBENZAPRINE HCL 10 MG PO TABS
5.0000 mg | ORAL_TABLET | Freq: Once | ORAL | Status: AC
  Administered 2023-10-01: 5 mg via ORAL
  Filled 2023-10-01: qty 1

## 2023-10-01 MED ORDER — KETOROLAC TROMETHAMINE 30 MG/ML IJ SOLN
30.0000 mg | Freq: Once | INTRAMUSCULAR | Status: AC
  Administered 2023-10-01: 30 mg via INTRAMUSCULAR
  Filled 2023-10-01: qty 1

## 2023-10-01 MED ORDER — MELOXICAM 15 MG PO TABS: 15.0000 mg | ORAL_TABLET | Freq: Every day | ORAL | 0 refills | Status: AC

## 2023-10-01 NOTE — Discharge Instructions (Addendum)
 You were evaluated in the ED for left arm pain and intermittent numbness.  Your physical exam findings are reassuring of any life-threatening injuries over disease.  Please follow-up with [facility] for further evaluation and management.  As discussed a nonemergent MRI or nerve test study may be performed at [facility] will be able to help you obtain this if indicated on their assessment.  In the interim, remain in sling for comfort.  Perform shoulder range of motion exercises as described in your discharge papers.  Get plenty of rest.
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
## 2023-10-01 NOTE — ED Triage Notes (Addendum)
 Pt to ed from home via POV for arm pain in left arm. Pt has been seen multiple times for same and was diagnosed with frozen shoulder per the pt. Pt is caox4, in no acute distress and ambulatory in triage. Per pt I needs an MRI and I need yall to schedule it.

## 2023-10-05 ENCOUNTER — Other Ambulatory Visit

## 2023-10-07 ENCOUNTER — Other Ambulatory Visit
